# Patient Record
Sex: Male | Born: 1950 | Race: White | Hispanic: No | Marital: Married | Smoking: Never smoker
Health system: Southern US, Community
[De-identification: ages and names within clinical notes are randomized; demographics above are authoritative.]

## PROBLEM LIST (undated history)

## (undated) DIAGNOSIS — E119 Type 2 diabetes mellitus without complications: Secondary | ICD-10-CM

## (undated) DIAGNOSIS — F4310 Post-traumatic stress disorder, unspecified: Secondary | ICD-10-CM

## (undated) DIAGNOSIS — Z9284 Personal history of unintended awareness under general anesthesia: Secondary | ICD-10-CM

## (undated) DIAGNOSIS — J019 Acute sinusitis, unspecified: Secondary | ICD-10-CM

## (undated) DIAGNOSIS — I10 Essential (primary) hypertension: Secondary | ICD-10-CM

## (undated) DIAGNOSIS — E1159 Type 2 diabetes mellitus with other circulatory complications: Secondary | ICD-10-CM

## (undated) DIAGNOSIS — J449 Chronic obstructive pulmonary disease, unspecified: Secondary | ICD-10-CM

## (undated) DIAGNOSIS — B9689 Other specified bacterial agents as the cause of diseases classified elsewhere: Secondary | ICD-10-CM

## (undated) DIAGNOSIS — N400 Enlarged prostate without lower urinary tract symptoms: Secondary | ICD-10-CM

## (undated) DIAGNOSIS — G4730 Sleep apnea, unspecified: Secondary | ICD-10-CM

## (undated) HISTORY — DX: Essential (primary) hypertension: I10

## (undated) HISTORY — DX: Other specified bacterial agents as the cause of diseases classified elsewhere: B96.89

## (undated) HISTORY — PX: CIRCUMCISION: SHX1350

## (undated) HISTORY — PX: COLON SURGERY: SHX602

## (undated) HISTORY — DX: Benign prostatic hyperplasia without lower urinary tract symptoms: N40.0

## (undated) HISTORY — PX: APPENDECTOMY: SHX54

## (undated) HISTORY — PX: NASAL SEPTUM SURGERY: SHX37

## (undated) HISTORY — DX: Sleep apnea, unspecified: G47.30

## (undated) HISTORY — PX: HERNIA REPAIR: SHX51

## (undated) HISTORY — PX: SMALL INTESTINE SURGERY: SHX150

## (undated) HISTORY — DX: Chronic obstructive pulmonary disease, unspecified: J44.9

## (undated) HISTORY — DX: Acute sinusitis, unspecified: J01.90

## (undated) HISTORY — DX: Type 2 diabetes mellitus without complications: E11.9

---

## 2013-03-01 ENCOUNTER — Ambulatory Visit (INDEPENDENT_AMBULATORY_CARE_PROVIDER_SITE_OTHER): Payer: BC Managed Care – PPO | Admitting: Adult Health

## 2013-03-01 ENCOUNTER — Encounter: Payer: Self-pay | Admitting: Adult Health

## 2013-03-01 VITALS — BP 146/82 | HR 87 | Temp 98.0°F | Resp 12 | Ht 68.25 in | Wt 211.5 lb

## 2013-03-01 DIAGNOSIS — E119 Type 2 diabetes mellitus without complications: Secondary | ICD-10-CM

## 2013-03-01 DIAGNOSIS — I10 Essential (primary) hypertension: Secondary | ICD-10-CM

## 2013-03-01 DIAGNOSIS — N529 Male erectile dysfunction, unspecified: Secondary | ICD-10-CM | POA: Insufficient documentation

## 2013-03-01 DIAGNOSIS — E1169 Type 2 diabetes mellitus with other specified complication: Secondary | ICD-10-CM | POA: Insufficient documentation

## 2013-03-01 HISTORY — DX: Essential (primary) hypertension: I10

## 2013-03-01 NOTE — Assessment & Plan Note (Signed)
Patient has recently married approximately 2 months ago. This is his third marriage. There is some anxiety related to this event. Also, patient believes ED occurring because of the blood pressure medication. He has been prescribed atenolol which can cause this side effect. Patient has already stopped taking the atenolol. I have asked him to resume the lisinopril/HCTZ and we will evaluate in 2-3 weeks if symptoms have improved. Patient also has history of enlarged prostate status post procedure in the 1990s; however, patient does not recall what type of procedure. I will try to obtain medical records from previous urologist in [facility] If symptoms persist we will do further workup. Consider referral to urology.

## 2013-03-01 NOTE — Progress Notes (Signed)
Subjective:    Patient ID: Luke Coleman, male    DOB: 1950-12-26, 62 y.o.   MRN: [id number]  HPI  Patient presents to clinic to establish care. He recently moved to the area from [place], [place]. He was commuting to [employer] for several years and decided to move closer to work. He reports having a history of sleep apnea and uses a CPAP nightly. Also has a history of DM and HTN. He was taking lisinopril and atenolol; however, he discontinued both approximately 2 weeks ago d/t having problems with erections. Pt also has a history of enlarged prostate. He had a procedure done in the 1990s but is unable to state what type of procedure. I will obtain medical records from previous providers.   Past Medical History  Diagnosis Date  . Diabetes mellitus without complication   . Hypertension   . COPD (chronic obstructive pulmonary disease)   . Sleep apnea     on CPAP  . Enlarged prostate     Past Surgical History  Procedure Laterality Date  . Appendectomy    . Hernia repair    . Small intestine surgery      Adhesions  . Nasal septum surgery      For deviated septum  . Circumcision  Age 17    Family History  Problem Relation Age of Onset  . Early death Mother     MVA - died age 99  . Alcohol abuse Father   . Hypertension Father   . Cancer Father 56    renal cell carcinoma  . Heart disease Father 48    MI - died  . Diabetes Brother   . Diabetes Maternal Uncle   . Diabetes Paternal Aunt   . Kidney disease Maternal Grandmother     History   Social History  . Marital Status: Married    Spouse Name: Angelica Chessman    Number of Children: 1  . Years of Education: 75   Occupational History  . Public house manager of Continuing Education     [employer]   Social History Main Topics  . Smoking status: Never Smoker   . Smokeless tobacco: Never Used  . Alcohol Use: Yes     Comment: Occasional  . Drug Use: No  . Sexually Active: Yes -- Male partner(s)   Other Topics Concern  .  Not on file   Social History Narrative   [name] grew up in [place], [place], [place]. He currently is living in [place], [place] with his wife of 2 months. He was married previously to his second wife for 29 years. Previous to that, he was married to his first wife for 7 years and has 1 daughter from this marriage (46 y/o). He widowed in 2013 (second wife). 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 He obtained a Scientist, water quality in Programme researcher, broadcasting/film/video, Two Geophysical data processor from [employer] 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 He enjoys baseball. Enjoys the outdoors. He also enjoys watching old movies. He used to be a Psychologist, occupational for a little league team.     Review of Systems  Constitutional: Negative.   HENT: Negative.   Eyes: Negative for photophobia and visual disturbance.       Wears eye glasses. Regular eye exams. Legally blind in the right eye.  Respiratory: Negative.   Cardiovascular: Negative for chest pain, palpitations and leg swelling.  Gastrointestinal: Negative.  Endocrine: Negative.   Genitourinary: Negative for dysuria, urgency, frequency and hematuria.       Weak urine stream. Hx of enlarged prostate.  Musculoskeletal: Negative.   Skin:       Mole on his back which needs to be removed. Refer to derm.  Allergic/Immunologic:       Seasonal allergies  Neurological: Negative.   Hematological: Negative.   Psychiatric/Behavioral: Negative for suicidal ideas, behavioral problems, confusion, sleep disturbance, self-injury and agitation. The patient is nervous/anxious.        Anxiety from recently remarrying     BP 146/82  Pulse 87  Temp(Src) 98 F (36.7 C) (Oral)  Resp 12  Ht 5' 8.25" (1.734 m)  Wt 211 lb 8 oz (95.936 kg)  BMI 31.91 kg/m2  SpO2 97%    Objective:   Physical Exam  Constitutional: He is oriented to person, place, and time.  Overweight,  pleasant male in NAD  HENT:  Head: Normocephalic and atraumatic.  Right Ear: External ear normal.  Left Ear: External ear normal.  Nose: Nose normal.  Mouth/Throat: Oropharynx is clear and moist.  Eyes: Conjunctivae and EOM are normal. Pupils are equal, round, and reactive to light.  Neck: Normal range of motion. Neck supple. No tracheal deviation present.  Cardiovascular: Normal rate, regular rhythm, normal heart sounds and intact distal pulses.  Exam reveals no gallop and no friction rub.   No murmur heard. Pulmonary/Chest: Effort normal and breath sounds normal. No respiratory distress. He has no wheezes. He has no rales.  Abdominal: Soft. Bowel sounds are normal. He exhibits no distension and no mass. There is no tenderness. There is no rebound and no guarding.  Musculoskeletal: Normal range of motion. He exhibits no edema and no tenderness.  Lymphadenopathy:    He has no cervical adenopathy.  Neurological: He is alert and oriented to person, place, and time. He displays normal reflexes. No cranial nerve deficit. He exhibits normal muscle tone. Coordination normal.  Skin: Skin is warm and dry.  Psychiatric: He has a normal mood and affect. His behavior is normal. Judgment and thought content normal.       Assessment & Plan:

## 2013-03-01 NOTE — Patient Instructions (Addendum)
    Thank you for choosing [place] [facility] for your health care needs.  I will request your medical records from your previous providers.  Please return for follow up in 2-3 weeks - blood pressure, diabetes

## 2013-03-01 NOTE — Assessment & Plan Note (Signed)
Patient's blood pressure is slightly elevated. He has stopped all of his blood pressure medication secondary to reports of difficulty obtaining erection. He was on atenolol and lisinopril/HCTZ. I have instructed him to resume the lisinopril/HCTZ. RTC in 2-3 weeks to reevaluate blood pressure.

## 2013-03-01 NOTE — Assessment & Plan Note (Addendum)
Patient reports good control of diabetes. He reports losing approximately 40 pounds since his wife died one year ago. His blood sugars in the a.m. are running in the low 100s. He also reports last hemoglobin A1c was below 7. He is on metformin 500 mg twice a day; however, he reports he has not been taking this medication because he was unsure if he needed to continue. Instructed to continue medication at this time. I will obtain the patient's medical records from previous provider. Return to clinic in 2-3 weeks for followup.

## 2013-03-16 ENCOUNTER — Encounter: Payer: Self-pay | Admitting: Adult Health

## 2013-03-20 ENCOUNTER — Encounter: Payer: Self-pay | Admitting: Adult Health

## 2013-03-20 ENCOUNTER — Ambulatory Visit (INDEPENDENT_AMBULATORY_CARE_PROVIDER_SITE_OTHER): Payer: BC Managed Care – PPO | Admitting: Adult Health

## 2013-03-20 VITALS — BP 124/70 | HR 65 | Resp 12 | Wt 211.0 lb

## 2013-03-20 DIAGNOSIS — N529 Male erectile dysfunction, unspecified: Secondary | ICD-10-CM

## 2013-03-20 DIAGNOSIS — E119 Type 2 diabetes mellitus without complications: Secondary | ICD-10-CM

## 2013-03-20 DIAGNOSIS — I10 Essential (primary) hypertension: Secondary | ICD-10-CM

## 2013-03-20 NOTE — Assessment & Plan Note (Signed)
He reports blood sugars are running between 110 and 115. Continues to take metformin daily. Check hemoglobin A1c.

## 2013-03-20 NOTE — Patient Instructions (Addendum)
  Blood pressure is doing fine.  Continue same medication.  Please have your labs drawn today.  We will notify you of results once available.  Please remember to activate your "MyChart" account at your earliest convenience.

## 2013-03-20 NOTE — Assessment & Plan Note (Signed)
Problems improved after stopping beta blocker. Discussed referral to urologist. Patient will think about it.

## 2013-03-20 NOTE — Assessment & Plan Note (Signed)
Well-controlled on lisinopril/HCTZ. Continue to follow

## 2013-03-20 NOTE — Progress Notes (Signed)
  Subjective:    Patient ID: Luke Coleman, male    DOB: 1951/03/16, 62 y.o.   MRN: [id number]  HPI  Patient presents for follow up of hypertension, diabetes and erectile dysfunction.  Pertaining to his hypertension, he is currently on lisinopril-HCTZ 20-12.5 mg daily. He reports that he monitors his b/p at home and it also has been normal. No dizziness, lightheadedness, syncope or near syncope symptoms.  Patient had been taken off of a beta blocker 2/2 problems with erectile dysfunction and difficulty ejaculating. Patient reports that these symptoms have improved since stopping the medication. He is able to obtain and maintain an erection. He also reports less difficulty with ejaculation.  He also reports that his blood glucose has been running, the highest, between 110 and 115. Continues to take metformin as prescribed. He has no other concerns at this time.   Current Outpatient Prescriptions on File Prior to Visit  Medication Sig Dispense Refill  . lisinopril-hydrochlorothiazide (PRINZIDE,ZESTORETIC) 20-12.5 MG per tablet Take 1 tablet by mouth daily.      . metFORMIN (GLUCOPHAGE) 500 MG tablet Take 500 mg by mouth 2 (two) times daily with a meal.      . Multiple Vitamin (MULTIVITAMIN) tablet Take 1 tablet by mouth daily.       No current facility-administered medications on file prior to visit.    Review of Systems  Constitutional: Negative.   HENT: Negative.   Respiratory: Negative.   Cardiovascular: Negative.   Gastrointestinal: Negative.   Genitourinary: Positive for frequency. Negative for dysuria, hematuria and difficulty urinating.  Neurological: Negative.   Psychiatric/Behavioral: Negative.   All other systems reviewed and are negative.   BP 124/70  Pulse 65  Resp 12  Wt 211 lb (95.709 kg)  BMI 31.83 kg/m2  SpO2 97%    Objective:   Physical Exam  Constitutional: He is oriented to person, place, and time.  Overweight, pleasant male in no apparent distress.   HENT:  Head: Normocephalic and atraumatic.  Cardiovascular: Normal rate, regular rhythm, normal heart sounds and intact distal pulses.  Exam reveals no gallop and no friction rub.   No murmur heard. Pulmonary/Chest: Effort normal and breath sounds normal. No respiratory distress. He has no wheezes. He has no rales. He exhibits no tenderness.  Abdominal: Soft. Bowel sounds are normal.  Musculoskeletal: Normal range of motion. He exhibits no edema and no tenderness.  Lymphadenopathy:    He has no cervical adenopathy.  Neurological: He is oriented to person, place, and time. No cranial nerve deficit. Coordination normal.  Skin: Skin is warm and dry.  Psychiatric: He has a normal mood and affect. His behavior is normal. Judgment and thought content normal.      Assessment & Plan:

## 2013-03-22 ENCOUNTER — Encounter: Payer: Self-pay | Admitting: *Deleted

## 2013-03-23 ENCOUNTER — Ambulatory Visit: Payer: BC Managed Care – PPO | Admitting: Adult Health

## 2013-03-31 NOTE — Telephone Encounter (Signed)
Mailed unread message to pt  

## 2013-04-04 ENCOUNTER — Encounter: Payer: Self-pay | Admitting: *Deleted

## 2013-04-04 ENCOUNTER — Telehealth: Payer: Self-pay | Admitting: *Deleted

## 2013-04-04 NOTE — Telephone Encounter (Signed)
Pt left message regarding most recent eye exam and immunizations, chart updated.

## 2013-05-23 ENCOUNTER — Ambulatory Visit (INDEPENDENT_AMBULATORY_CARE_PROVIDER_SITE_OTHER): Payer: BC Managed Care – PPO | Admitting: Adult Health

## 2013-05-23 ENCOUNTER — Encounter: Payer: Self-pay | Admitting: Adult Health

## 2013-05-23 VITALS — BP 126/68 | HR 89 | Temp 98.5°F | Resp 12 | Wt 214.0 lb

## 2013-05-23 DIAGNOSIS — E66811 Obesity, class 1: Secondary | ICD-10-CM | POA: Insufficient documentation

## 2013-05-23 DIAGNOSIS — E663 Overweight: Secondary | ICD-10-CM | POA: Insufficient documentation

## 2013-05-23 DIAGNOSIS — J329 Chronic sinusitis, unspecified: Secondary | ICD-10-CM | POA: Insufficient documentation

## 2013-05-23 DIAGNOSIS — E6609 Other obesity due to excess calories: Secondary | ICD-10-CM | POA: Insufficient documentation

## 2013-05-23 MED ORDER — PHENTERMINE HCL 37.5 MG PO CAPS: 37.5000 mg | ORAL_CAPSULE | ORAL | Status: DC

## 2013-05-23 MED ORDER — AMOXICILLIN-POT CLAVULANATE 875-125 MG PO TABS: 1.0000 | ORAL_TABLET | Freq: Two times a day (BID) | ORAL | Status: DC

## 2013-05-23 NOTE — Assessment & Plan Note (Signed)
Start Augmentin twice a day x10 days. Over-the-counter cough medication such as Robitussin or Delsym. May try Coricidin products for symptom management. Afrin nasal spray twice a day for 3 days for severe congestion. RTC if symptoms are not improved within 4-5 days.

## 2013-05-23 NOTE — Assessment & Plan Note (Signed)
Patient has taken phentermine in the past. He would like to try this again to help with weight loss. Prescription given to patient. RTC in 1 month for f/u.

## 2013-05-23 NOTE — Progress Notes (Signed)
  Subjective:    Patient ID: Luke Coleman, male    DOB: 03-10-1951, 62 y.o.   MRN: [id number]  HPI  Pt reports sinus congestion since Sunday.  Pt reports greenish drainage from nose, ear pressure and tinnitus. Pt reports diarrhea Monday but that has resolved.  Pt states he uses a CPAP at night and has been unable to use it d/t nasal congestion.  Pt has been taking claritin without relief.    Current Outpatient Prescriptions on File Prior to Visit  Medication Sig Dispense Refill  . lisinopril-hydrochlorothiazide (PRINZIDE,ZESTORETIC) 20-12.5 MG per tablet Take 1 tablet by mouth daily.      . metFORMIN (GLUCOPHAGE) 500 MG tablet Take 500 mg by mouth 2 (two) times daily with a meal.      . Multiple Vitamin (MULTIVITAMIN) tablet Take 1 tablet by mouth daily.       No current facility-administered medications on file prior to visit.     Review of Systems  Constitutional: Negative.  Negative for fever and chills.  HENT: Positive for ear pain, congestion, postnasal drip, sinus pressure and tinnitus.   Eyes: Negative.   Respiratory: Positive for cough.        Cough today from postnasal drip  Musculoskeletal: Negative.   Skin: Negative.   Neurological: Negative.  Negative for dizziness, light-headedness and headaches.  Psychiatric/Behavioral: Negative.        Objective:   Physical Exam  Constitutional: He appears well-developed and well-nourished.  HENT:  Head: Normocephalic.  Right Ear: Tympanic membrane and ear canal normal.  Left Ear: Tympanic membrane and ear canal normal.  Nose: Nose normal.  Mouth/Throat: Oropharynx is clear and moist and mucous membranes are normal.  Pulmonary/Chest: Effort normal and breath sounds normal.  Psychiatric: He has a normal mood and affect. His speech is normal and behavior is normal. Judgment and thought content normal. Cognition and memory are normal.    BP 126/68  Pulse 89  Temp(Src) 98.5 F (36.9 C) (Oral)  Resp 12  Wt 214 lb (97.07 kg)   BMI 32.28 kg/m2  SpO2 97%       Assessment & Plan:

## 2013-05-23 NOTE — Patient Instructions (Addendum)
  Start Augmentin twice a day for 10 days.  You can use afrin nasal spray for severe congestion.  Breathe Right nasal strips are helpful in keeping your nostrils open and help you breathe better.  For cough use robitussin or delsym for cough.  You can try Coricidin products that are intended for those who have high blood pressure.

## 2013-06-02 ENCOUNTER — Other Ambulatory Visit: Payer: Self-pay | Admitting: *Deleted

## 2013-06-29 ENCOUNTER — Other Ambulatory Visit: Payer: Self-pay

## 2013-08-16 ENCOUNTER — Ambulatory Visit (INDEPENDENT_AMBULATORY_CARE_PROVIDER_SITE_OTHER): Payer: BC Managed Care – PPO | Admitting: Internal Medicine

## 2013-08-16 ENCOUNTER — Encounter: Payer: Self-pay | Admitting: Internal Medicine

## 2013-08-16 VITALS — BP 140/92 | HR 111 | Temp 98.2°F | Wt 217.0 lb

## 2013-08-16 DIAGNOSIS — H6690 Otitis media, unspecified, unspecified ear: Secondary | ICD-10-CM

## 2013-08-16 DIAGNOSIS — H6693 Otitis media, unspecified, bilateral: Secondary | ICD-10-CM

## 2013-08-16 MED ORDER — ANTIPYRINE-BENZOCAINE 5.4-1.4 % OT SOLN: 3.0000 [drp] | OTIC | Status: DC | PRN

## 2013-08-16 MED ORDER — AMOXICILLIN-POT CLAVULANATE 875-125 MG PO TABS: 1.0000 | ORAL_TABLET | Freq: Two times a day (BID) | ORAL | Status: DC

## 2013-08-16 NOTE — Assessment & Plan Note (Signed)
Symptoms and exam are most consistent with bilateral otitis media. Will treat with Augmentin and start topical Auralgan as needed for pain. He may use Tylenol or ibuprofen as needed for pain. If no improvement over the next 48-72 hours, patient will call or return to clinic. Followup for recheck of his years in 2 weeks.

## 2013-08-16 NOTE — Progress Notes (Signed)
Pre-visit discussion using our clinic review tool. No additional management support is needed unless otherwise documented below in the visit note.  

## 2013-08-16 NOTE — Patient Instructions (Signed)

## 2013-08-16 NOTE — Progress Notes (Signed)
   Subjective:    Patient ID: Luke Coleman, male    DOB: September 21, 1950, 62 y.o.   MRN: [id number]  HPI  62YO with DM presents for acute visit complains of 2 weeks of nasal congestion, cough with yellow sputum. No shortness of breath. No chest pain. Now also having pressure and ringing in both ears. No flu shot. No fever, chills at home. Taking Cold-Ez and OTC meds with no improvement.  Outpatient Encounter Prescriptions as of 08/16/2013  Medication Sig  . lisinopril-hydrochlorothiazide (PRINZIDE,ZESTORETIC) 20-12.5 MG per tablet Take 1 tablet by mouth daily.  . metFORMIN (GLUCOPHAGE) 500 MG tablet Take 500 mg by mouth 2 (two) times daily with a meal.  . Multiple Vitamin (MULTIVITAMIN) tablet Take 1 tablet by mouth daily.  . phentermine 37.5 MG capsule Take 1 capsule (37.5 mg total) by mouth every morning.  . [DISCONTINUED] amoxicillin-clavulanate (AUGMENTIN) 875-125 MG per tablet Take 1 tablet by mouth 2 (two) times daily.     Review of Systems  Constitutional: Positive for fatigue. Negative for fever, chills and activity change.  HENT: Positive for congestion, ear pain, postnasal drip, rhinorrhea and tinnitus. Negative for ear discharge, hearing loss, nosebleeds, sinus pressure, sneezing, sore throat, trouble swallowing and voice change.   Eyes: Negative for discharge, redness, itching and visual disturbance.  Respiratory: Positive for cough. Negative for chest tightness, shortness of breath, wheezing and stridor.   Cardiovascular: Negative for chest pain and leg swelling.  Musculoskeletal: Negative for arthralgias, myalgias, neck pain and neck stiffness.  Skin: Negative for color change and rash.  Neurological: Negative for dizziness, facial asymmetry and headaches.  Psychiatric/Behavioral: Negative for sleep disturbance.       Objective:   Physical Exam  Constitutional: He is oriented to person, place, and time. He appears well-developed and well-nourished. No distress.  HENT:    Head: Normocephalic and atraumatic.  Right Ear: External ear normal. Tympanic membrane is erythematous and bulging. A middle ear effusion is present.  Left Ear: External ear normal. Tympanic membrane is erythematous and bulging. A middle ear effusion is present.  Nose: Nose normal.  Mouth/Throat: Oropharynx is clear and moist. No oropharyngeal exudate.  Eyes: Conjunctivae and EOM are normal. Pupils are equal, round, and reactive to light. Right eye exhibits no discharge. Left eye exhibits no discharge. No scleral icterus.  Neck: Normal range of motion. Neck supple. No tracheal deviation present. No thyromegaly present.  Cardiovascular: Normal rate, regular rhythm and normal heart sounds.  Exam reveals no gallop and no friction rub.   No murmur heard. Pulmonary/Chest: Effort normal. No accessory muscle usage. Not tachypneic. No respiratory distress. He has decreased breath sounds. He has no wheezes. He has no rhonchi. He has no rales. He exhibits no tenderness.  Musculoskeletal: Normal range of motion. He exhibits no edema.  Lymphadenopathy:    He has no cervical adenopathy.  Neurological: He is alert and oriented to person, place, and time. No cranial nerve deficit. Coordination normal.  Skin: Skin is warm and dry. No rash noted. He is not diaphoretic. No erythema. No pallor.  Psychiatric: He has a normal mood and affect. His behavior is normal. Judgment and thought content normal.          Assessment & Plan:

## 2013-09-11 ENCOUNTER — Telehealth: Payer: Self-pay | Admitting: Adult Health

## 2013-09-11 MED ORDER — METFORMIN HCL 500 MG PO TABS: 500.0000 mg | ORAL_TABLET | Freq: Two times a day (BID) | ORAL | Status: DC

## 2013-09-11 MED ORDER — LISINOPRIL-HYDROCHLOROTHIAZIDE 20-12.5 MG PO TABS: 1.0000 | ORAL_TABLET | Freq: Every day | ORAL | Status: DC

## 2013-09-11 NOTE — Telephone Encounter (Signed)
Rx sent to pharmacy by escript  

## 2013-09-11 NOTE — Telephone Encounter (Signed)
Pt wants to changes his pharmacy to [address] Please send 90 day supply of his medform 500mg  2xdaily lisinpril hctz 20-12.5  1 daily  Please advise when this is done Pt will only have enough meds for today and tomorrow

## 2013-09-14 ENCOUNTER — Ambulatory Visit (INDEPENDENT_AMBULATORY_CARE_PROVIDER_SITE_OTHER): Payer: BC Managed Care – PPO | Admitting: Adult Health

## 2013-09-14 ENCOUNTER — Encounter: Payer: Self-pay | Admitting: Adult Health

## 2013-09-14 VITALS — BP 130/68 | HR 102 | Temp 98.3°F | Resp 12 | Wt 217.0 lb

## 2013-09-14 DIAGNOSIS — I10 Essential (primary) hypertension: Secondary | ICD-10-CM

## 2013-09-14 DIAGNOSIS — Z79899 Other long term (current) drug therapy: Secondary | ICD-10-CM

## 2013-09-14 DIAGNOSIS — R0981 Nasal congestion: Secondary | ICD-10-CM

## 2013-09-14 DIAGNOSIS — E663 Overweight: Secondary | ICD-10-CM

## 2013-09-14 DIAGNOSIS — M79609 Pain in unspecified limb: Secondary | ICD-10-CM

## 2013-09-14 DIAGNOSIS — J3489 Other specified disorders of nose and nasal sinuses: Secondary | ICD-10-CM

## 2013-09-14 DIAGNOSIS — E119 Type 2 diabetes mellitus without complications: Secondary | ICD-10-CM

## 2013-09-14 DIAGNOSIS — M79674 Pain in right toe(s): Secondary | ICD-10-CM

## 2013-09-14 MED ORDER — FLUTICASONE PROPIONATE 50 MCG/ACT NA SUSP: 2.0000 | Freq: Every day | NASAL | Status: DC

## 2013-09-14 MED ORDER — METFORMIN HCL 500 MG PO TABS: 500.0000 mg | ORAL_TABLET | Freq: Two times a day (BID) | ORAL | Status: DC

## 2013-09-14 MED ORDER — PHENTERMINE HCL 37.5 MG PO TABS: 37.5000 mg | ORAL_TABLET | Freq: Every day | ORAL | Status: DC

## 2013-09-14 MED ORDER — CEPHALEXIN 500 MG PO CAPS: 500.0000 mg | ORAL_CAPSULE | Freq: Four times a day (QID) | ORAL | Status: DC

## 2013-09-14 MED ORDER — LISINOPRIL-HYDROCHLOROTHIAZIDE 20-12.5 MG PO TABS: 1.0000 | ORAL_TABLET | Freq: Every day | ORAL | Status: DC

## 2013-09-14 NOTE — Progress Notes (Signed)
   Subjective:    Patient ID: Luke Coleman, male    DOB: 10-25-1950, 63 y.o.   MRN: [id number]  HPI  Patient is a pleasant 63 year old male who presents to clinic with some sinus congestion that has been ongoing for a couple of days. He is concerned because he was in his office with someone who was recently been diagnosed with the flu. He does not have a sore throat, denies fever, no body aches. He also has a small area on his right great toe that is red and irritated. He reports the area is improved. It is less swollen and painful.  Pt reports that the dosage of the phentermine is interfering with sleep. It is a capsule and cannot break in half.   Pt is also here for follow up HTN and DM. He is taking his medications as prescribed. Has been trying to eat healthier. No exercising at present.   Current Outpatient Prescriptions on File Prior to Visit  Medication Sig Dispense Refill  . Multiple Vitamin (MULTIVITAMIN) tablet Take 1 tablet by mouth daily.       No current facility-administered medications on file prior to visit.    Review of Systems  Constitutional: Negative for fever and chills.  HENT: Positive for congestion. Negative for ear pain, postnasal drip, rhinorrhea, sinus pressure and sore throat.   Respiratory: Negative.  Negative for cough and wheezing.   Cardiovascular: Negative.   Skin:       Redness on the top of his right toe  Neurological: Negative.   All other systems reviewed and are negative.       Objective:   Physical Exam  Constitutional: He is oriented to person, place, and time. He appears well-developed and well-nourished. No distress.  HENT:  Head: Normocephalic and atraumatic.  Right Ear: External ear normal.  Left Ear: External ear normal.  Mouth/Throat: Oropharynx is clear and moist. No oropharyngeal exudate.  Cardiovascular: Normal rate and regular rhythm.   Pulmonary/Chest: Effort normal. No respiratory distress.  Musculoskeletal: Normal range  of motion.  No pain with palpation of his joints on his right foot.  Lymphadenopathy:    He has no cervical adenopathy.  Neurological: He is alert and oriented to person, place, and time.  Skin: There is erythema.  Small area on the top of his right great toe that is erythematous and painful  Psychiatric: He has a normal mood and affect. His behavior is normal. Judgment and thought content normal.    BP 130/68  Pulse 102  Temp(Src) 98.3 F (36.8 C) (Oral)  Resp 12  Wt 217 lb (98.431 kg)  SpO2 96%       Assessment & Plan:

## 2013-09-14 NOTE — Progress Notes (Signed)
Pre visit review using our clinic review tool, if applicable. No additional management support is needed unless otherwise documented below in the visit note. 

## 2013-09-14 NOTE — Patient Instructions (Signed)
  Please start Keflex 500 mg twice a day for 7 days.  Please have your labs done prior to leaving the office.  Flonase 2 sprays into each nostril daily  I sent in refills for your medications to [facility]

## 2013-09-15 ENCOUNTER — Telehealth: Payer: Self-pay

## 2013-09-15 LAB — BASIC METABOLIC PANEL
BUN: 19 mg/dL (ref 6–23)
CHLORIDE: 103 meq/L (ref 96–112)
CO2: 29 mEq/L (ref 19–32)
Calcium: 9.4 mg/dL (ref 8.4–10.5)
Creatinine, Ser: 1.3 mg/dL (ref 0.4–1.5)
GFR: 58.93 mL/min — AB (ref >60.00)
Glucose, Bld: 206 mg/dL — ABNORMAL HIGH (ref 70–99)
Potassium: 4.8 mEq/L (ref 3.5–5.1)
SODIUM: 140 meq/L (ref 135–145)

## 2013-09-15 LAB — HEMOGLOBIN A1C: Hgb A1c MFr Bld: 5.9 % (ref 4.6–6.5)

## 2013-09-15 NOTE — Telephone Encounter (Signed)
Relevant patient education assigned to patient using Emmi. ° °

## 2013-09-17 ENCOUNTER — Encounter: Payer: Self-pay | Admitting: Adult Health

## 2013-09-17 DIAGNOSIS — R0981 Nasal congestion: Secondary | ICD-10-CM | POA: Insufficient documentation

## 2013-09-17 DIAGNOSIS — M79674 Pain in right toe(s): Secondary | ICD-10-CM | POA: Insufficient documentation

## 2013-09-17 NOTE — Assessment & Plan Note (Signed)
Reports of nasal congestion. None at present. No other respiratory symptoms. Recommend irrigation of sinuses with saline. Call if he develops fever or other symptoms.

## 2013-09-17 NOTE — Assessment & Plan Note (Signed)
Change from phentermine capsule to tablets so that he could cut in half.

## 2013-09-17 NOTE — Assessment & Plan Note (Signed)
Check HgbA1c. Continue to follow

## 2013-09-17 NOTE — Assessment & Plan Note (Signed)
Taking meds as prescribed. Check bmet. Continue to follow.

## 2013-09-17 NOTE — Assessment & Plan Note (Signed)
Does not appear to be gout. No painful joints in area. The area appears to be small abscess. Start Keflex. Call if no improvement.

## 2013-09-19 NOTE — Telephone Encounter (Signed)
Mailed unread message to pt  

## 2013-10-02 ENCOUNTER — Telehealth: Payer: Self-pay | Admitting: Adult Health
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
## 2013-12-21 ENCOUNTER — Other Ambulatory Visit: Payer: Self-pay | Admitting: Adult Health

## 2014-03-22 ENCOUNTER — Encounter: Payer: Self-pay | Admitting: *Deleted

## 2014-03-26 NOTE — Telephone Encounter (Signed)
Mailed unread message to pt  

## 2014-04-19 ENCOUNTER — Ambulatory Visit: Payer: BC Managed Care – PPO | Admitting: Adult Health

## 2014-04-23 ENCOUNTER — Encounter: Payer: Self-pay | Admitting: Adult Health

## 2014-04-23 ENCOUNTER — Ambulatory Visit (INDEPENDENT_AMBULATORY_CARE_PROVIDER_SITE_OTHER): Payer: BC Managed Care – PPO | Admitting: Adult Health

## 2014-04-23 ENCOUNTER — Telehealth: Payer: Self-pay | Admitting: *Deleted

## 2014-04-23 VITALS — BP 130/70 | HR 81 | Resp 14 | Ht 68.25 in | Wt 216.2 lb

## 2014-04-23 DIAGNOSIS — Z23 Encounter for immunization: Secondary | ICD-10-CM

## 2014-04-23 DIAGNOSIS — E119 Type 2 diabetes mellitus without complications: Secondary | ICD-10-CM

## 2014-04-23 DIAGNOSIS — I10 Essential (primary) hypertension: Secondary | ICD-10-CM

## 2014-04-23 DIAGNOSIS — E663 Overweight: Secondary | ICD-10-CM

## 2014-04-23 LAB — BASIC METABOLIC PANEL
BUN: 17 mg/dL (ref 6–23)
CO2: 27 meq/L (ref 19–32)
CREATININE: 1.3 mg/dL (ref 0.4–1.5)
Calcium: 9.2 mg/dL (ref 8.4–10.5)
Chloride: 104 mEq/L (ref 96–112)
GFR: 59.87 mL/min — ABNORMAL LOW (ref >60.00)
Glucose, Bld: 120 mg/dL — ABNORMAL HIGH (ref 70–99)
Potassium: 4.8 mEq/L (ref 3.5–5.1)
Sodium: 141 mEq/L (ref 135–145)

## 2014-04-23 LAB — HEMOGLOBIN A1C: Hgb A1c MFr Bld: 6 % (ref 4.6–6.5)

## 2014-04-23 MED ORDER — PHENTERMINE HCL 37.5 MG PO TABS: 37.5000 mg | ORAL_TABLET | Freq: Every day | ORAL | Status: DC

## 2014-04-23 NOTE — Progress Notes (Signed)
Patient ID: Luke Coleman, male   DOB: [birth year], 63 y.o.   MRN: [id number]   Subjective:    Patient ID: Luke Coleman, male    DOB: 09/01/1950, 63 y.o.   MRN: [id number]  HPI  Luke Coleman is a pleasant 63 y/o male who presents to clinic for HTN and diabetes follow up. He is also still interested in trying the phentermine. Reports that he went to pick up the prescription at the pharmacy and they did not have them at the time. He never went back to get the medication. He has not gained any weight but he has not really lost any either. Pertaining to his hypertension, he is taking his medications as prescribed. No side effects from medication. Pertaining to his diabetes, he reports that his fasting blood sugars are in the low 100s. No hypoglycemic events. Compliance with medication.  Past Medical History  Diagnosis Date  . Diabetes mellitus without complication   . Hypertension   . COPD (chronic obstructive pulmonary disease)   . Sleep apnea     on CPAP  . Enlarged prostate     Current Outpatient Prescriptions on File Prior to Visit  Medication Sig Dispense Refill  . cephALEXin (KEFLEX) 500 MG capsule Take 1 capsule (500 mg total) by mouth 4 (four) times daily.  14 capsule  0  . fluticasone (FLONASE) 50 MCG/ACT nasal spray Place 2 sprays into both nostrils daily.  16 g  6  . lisinopril-hydrochlorothiazide (PRINZIDE,ZESTORETIC) 20-12.5 MG per tablet Take 1 tablet by mouth daily.  90 tablet  3  . metFORMIN (GLUCOPHAGE) 500 MG tablet Take 1 tablet (500 mg total) by mouth 2 (two) times daily with a meal.  180 tablet  3  . metFORMIN (GLUCOPHAGE) 500 MG tablet TAKE ONE TABLET BY MOUTH TWICE DAILY WITH  A  MEAL  180 tablet  1  . Multiple Vitamin (MULTIVITAMIN) tablet Take 1 tablet by mouth daily.      . phentermine (ADIPEX-P) 37.5 MG tablet Take 1 tablet (37.5 mg total) by mouth daily before breakfast.  30 tablet  3   No current facility-administered medications on file prior to visit.      Review of Systems  Constitutional: Negative.   HENT: Negative.   Eyes: Negative.   Respiratory: Negative.   Cardiovascular: Negative.   Gastrointestinal: Negative.   Endocrine: Negative.   Genitourinary: Negative.   Musculoskeletal: Negative.   Skin: Negative.   Allergic/Immunologic: Negative.   Neurological: Negative.   Hematological: Negative.   Psychiatric/Behavioral: Negative.        Objective:  Ht 5' 8.25" (1.734 m)   Physical Exam  Constitutional: He is oriented to person, place, and time. He appears well-developed and well-nourished. No distress.  HENT:  Head: Normocephalic and atraumatic.  Eyes: Conjunctivae and EOM are normal.  Cardiovascular: Normal rate, regular rhythm, normal heart sounds and intact distal pulses.  Exam reveals no gallop and no friction rub.   No murmur heard. Pulmonary/Chest: Effort normal and breath sounds normal. No respiratory distress. He has no wheezes. He has no rales.  Musculoskeletal: Normal range of motion.  Neurological: He is alert and oriented to person, place, and time.  Skin: Skin is warm and dry.  Psychiatric: He has a normal mood and affect. His behavior is normal. Judgment and thought content normal.       Assessment & Plan:   1. Essential hypertension Controlled on medication. Check labs. Follow. - Basic metabolic panel  2. Diabetes mellitus type 2, controlled  Controlled on medication. Check labs. Adjust medications if necessary. Follow - Hemoglobin A1c; Future  3. Overweight Prescription for phentermine given. Discussed side effects. He may start off by taking half a tablet if he feels jittery with medication and progressed to take 1 tablet daily. We also discussed that there are other medications that may also help if he finds that this one does not work for him.  4. Need for prophylactic vaccination and inoculation against influenza Received a flu vaccine in clinic today - Flu Vaccine QUAD 36+ mos PF IM  (Fluarix Quad PF)

## 2014-04-23 NOTE — Telephone Encounter (Signed)
PA approved, pharmacy notified.

## 2014-04-23 NOTE — Progress Notes (Signed)
Pre visit review using our clinic review tool, if applicable. No additional management support is needed unless otherwise documented below in the visit note. 

## 2014-04-23 NOTE — Telephone Encounter (Signed)
Fax from pharmacy, [employer] needs PA, started online

## 2014-04-23 NOTE — Addendum Note (Signed)
Addended by: Karlene Einstein D on: 04/23/2014 08:42 AM   Modules accepted: Orders

## 2014-04-25 NOTE — Telephone Encounter (Signed)
Mailed unread message to pt  

## 2014-08-20 ENCOUNTER — Other Ambulatory Visit: Payer: Self-pay | Admitting: *Deleted

## 2014-08-20 MED ORDER — METFORMIN HCL 500 MG PO TABS: ORAL_TABLET | ORAL | Status: DC

## 2014-08-22 DIAGNOSIS — B9689 Other specified bacterial agents as the cause of diseases classified elsewhere: Secondary | ICD-10-CM

## 2014-08-22 HISTORY — DX: Other specified bacterial agents as the cause of diseases classified elsewhere: B96.89

## 2014-10-22 ENCOUNTER — Other Ambulatory Visit: Payer: Self-pay | Admitting: *Deleted

## 2014-10-22 MED ORDER — LISINOPRIL-HYDROCHLOROTHIAZIDE 20-12.5 MG PO TABS: 1.0000 | ORAL_TABLET | Freq: Every day | ORAL | Status: DC

## 2014-11-14 ENCOUNTER — Encounter: Payer: Self-pay | Admitting: Internal Medicine

## 2014-11-14 ENCOUNTER — Ambulatory Visit (INDEPENDENT_AMBULATORY_CARE_PROVIDER_SITE_OTHER): Payer: BC Managed Care – PPO | Admitting: Internal Medicine

## 2014-11-14 VITALS — BP 134/72 | HR 106 | Temp 99.1°F | Wt 219.0 lb

## 2014-11-14 DIAGNOSIS — J0110 Acute frontal sinusitis, unspecified: Secondary | ICD-10-CM

## 2014-11-14 MED ORDER — METHYLPREDNISOLONE ACETATE 80 MG/ML IJ SUSP
80.0000 mg | Freq: Once | INTRAMUSCULAR | Status: AC
  Administered 2014-11-14: 80 mg via INTRAMUSCULAR

## 2014-11-14 MED ORDER — FLUTICASONE PROPIONATE 50 MCG/ACT NA SUSP: 2.0000 | Freq: Every day | NASAL | Status: DC

## 2014-11-14 MED ORDER — AMOXICILLIN-POT CLAVULANATE 875-125 MG PO TABS: 1.0000 | ORAL_TABLET | Freq: Two times a day (BID) | ORAL | Status: DC

## 2014-11-14 NOTE — Addendum Note (Signed)
Addended by: Lurlean Nanny on: 11/14/2014 04:57 PM   Modules accepted: Orders

## 2014-11-14 NOTE — Progress Notes (Signed)
HPI  Pt presents to the clinic today with c/o headache, fever, facial pressure, nasal congestion and ear pain. He reports this has been going on 2 week ago now. He is blowing green mucous out of his nose. He denies chills or body aches. He has been taking a sinus rinse, Flonase and Sudafed. He will need a refill of his Flonase today. He does have seasonal allergies. He has not had sick contacts. He does not smoke.  Review of Systems    Past Medical History  Diagnosis Date  . Diabetes mellitus without complication   . Hypertension   . COPD (chronic obstructive pulmonary disease)   . Sleep apnea     on CPAP  . Enlarged prostate     Family History  Problem Relation Age of Onset  . Early death Mother     MVA - died age 35  . Alcohol abuse Father   . Hypertension Father   . Cancer Father 69    renal cell carcinoma  . Heart disease Father 95    MI - died  . Diabetes Brother   . Diabetes Maternal Uncle   . Diabetes Paternal Aunt   . Kidney disease Maternal Grandmother     History   Social History  . Marital Status: Married    Spouse Name: Robin Searing  . Number of Children: 1  . Years of Education: 17   Occupational History  . Scientist, physiological of Continuing Education     [employer]   Social History Main Topics  . Smoking status: Never Smoker   . Smokeless tobacco: Never Used  . Alcohol Use: Yes     Comment: Occasional  . Drug Use: No  . Sexual Activity:    Partners: Female   Other Topics Concern  . Not on file   Social History Narrative   Mr. Choyce grew up in [place], [place], [place]. He currently is living in [place], [place] with his wife of 2 months. He was married previously to his second wife for 29 years. Previous to that, he was married to his first wife for 7 years and has 1 daughter from this marriage (54 y/o). He widowed in 2013 (second wife). 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. He obtained a Oceanographer in Scientist, physiological, [place] from [employer]. 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 He enjoys baseball. Enjoys the outdoors. He also enjoys watching old movies. 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    No Known Allergies   Constitutional: Positive headache, fatigue and fever. Denies abrupt weight changes.  HEENT:  Positive eye pain, facial pain, nasal congestion. Denies eye redness, ear pain, ringing in the ears, wax buildup, runny nose or bloody nose. Respiratory: Denies cough, difficulty breathing or shortness of breath.  Cardiovascular: Denies chest pain, chest tightness, palpitations or swelling in the hands or feet.   No other specific complaints in a complete review of systems (except as listed in HPI above).  Objective:  BP 134/72 mmHg  Pulse 106  Temp(Src) 99.1 F (37.3 C) (Oral)  Wt 219 lb (99.338 kg)  SpO2 99%   General: Appears his stated age,ill appearing in NAD. HEENT: Head: normal shape and size, frontal sinus tenderness noted; Eyes: sclera injected, no icterus, conjunctiva pink; Ears: Tm's gray and intact, normal light reflex; Nose: mucosa pink and moist, septum  midline; Throat/Mouth: + PND. Teeth present, mucosa pink and moist, no exudate noted, no lesions or ulcerations noted.  Neck: No adenopathy noted. Cardiovascular: Tachycardic with normal rhythm. S1,S2 noted.  No murmur, rubs or gallops noted.  Pulmonary/Chest: Normal effort and positive vesicular breath sounds. No respiratory distress. No wheezes, rales or ronchi noted.      Assessment & Plan:   Acute frontal sinusitis  Can use a Neti Pot which can be purchased from your local drug store. Flonase 2 sprays each nostril for 3 days and then as needed. Medication refilled today Augmentin BID for 10 days 80 mg Depo IM today Ibuprofen for fever  RTC as needed or if symptoms persist.

## 2014-11-14 NOTE — Patient Instructions (Signed)

## 2014-11-14 NOTE — Progress Notes (Signed)
Pre visit review using our clinic review tool, if applicable. No additional management support is needed unless otherwise documented below in the visit note. 

## 2015-03-03 ENCOUNTER — Other Ambulatory Visit: Payer: Self-pay | Admitting: Nurse Practitioner

## 2015-03-21 ENCOUNTER — Telehealth: Payer: Self-pay | Admitting: Nurse Practitioner

## 2015-03-21 NOTE — Telephone Encounter (Signed)
Left message on VM for pt to return call to schedule appoint.

## 2015-03-21 NOTE — Telephone Encounter (Signed)
Pt request to have A1c recheck and refill on metformin and lisinopril/msn

## 2015-03-29 ENCOUNTER — Ambulatory Visit (INDEPENDENT_AMBULATORY_CARE_PROVIDER_SITE_OTHER): Payer: BC Managed Care – PPO | Admitting: Nurse Practitioner

## 2015-03-29 VITALS — BP 140/82 | HR 69 | Temp 98.2°F | Resp 18 | Ht 68.25 in | Wt 217.2 lb

## 2015-03-29 DIAGNOSIS — E669 Obesity, unspecified: Secondary | ICD-10-CM | POA: Diagnosis not present

## 2015-03-29 DIAGNOSIS — E119 Type 2 diabetes mellitus without complications: Secondary | ICD-10-CM

## 2015-03-29 DIAGNOSIS — I10 Essential (primary) hypertension: Secondary | ICD-10-CM | POA: Diagnosis not present

## 2015-03-29 DIAGNOSIS — J0110 Acute frontal sinusitis, unspecified: Secondary | ICD-10-CM

## 2015-03-29 LAB — BASIC METABOLIC PANEL
BUN: 23 mg/dL (ref 6–23)
CHLORIDE: 100 meq/L (ref 96–112)
CO2: 29 mEq/L (ref 19–32)
Calcium: 9.5 mg/dL (ref 8.4–10.5)
Creatinine, Ser: 1.17 mg/dL (ref 0.40–1.50)
GFR: 66.81 mL/min (ref >60.00)
Glucose, Bld: 132 mg/dL — ABNORMAL HIGH (ref 70–99)
POTASSIUM: 4.9 meq/L (ref 3.5–5.1)
Sodium: 137 mEq/L (ref 135–145)

## 2015-03-29 LAB — MICROALBUMIN / CREATININE URINE RATIO
Creatinine,U: 156.8 mg/dL
MICROALB UR: 2.7 mg/dL — AB (ref 0.0–1.9)
Microalb Creat Ratio: 1.7 mg/g (ref 0.0–30.0)

## 2015-03-29 LAB — HEMOGLOBIN A1C: Hgb A1c MFr Bld: 6.1 % (ref 4.6–6.5)

## 2015-03-29 MED ORDER — LISINOPRIL-HYDROCHLOROTHIAZIDE 20-12.5 MG PO TABS: 1.0000 | ORAL_TABLET | Freq: Every day | ORAL | Status: DC

## 2015-03-29 MED ORDER — FLUTICASONE PROPIONATE 50 MCG/ACT NA SUSP: 2.0000 | Freq: Every day | NASAL | Status: DC

## 2015-03-29 MED ORDER — METFORMIN HCL 500 MG PO TABS: 500.0000 mg | ORAL_TABLET | Freq: Two times a day (BID) | ORAL | Status: DC

## 2015-03-29 NOTE — Progress Notes (Signed)
Pre visit review using our clinic review tool, if applicable. No additional management support is needed unless otherwise documented below in the visit note. 

## 2015-03-29 NOTE — Patient Instructions (Signed)
Please work on diet and exercise with the handout.   You can make a nurse visit and come back for a blood pressure check- if it is below 140/90 we can start Phentermine. (no copay for nurse visit).   Please visit the lab before leaving today.   Follow up in 3 months for weight loss.

## 2015-03-29 NOTE — Progress Notes (Signed)
   Subjective:    Patient ID: Luke Coleman, male    DOB: [birth year], 64 y.o.   MRN: [id number]  HPI   Mr. Adkins is a 64 yo male with a CC of medication refills.   1) Metformin- 1 tablet twice daily with meals, 140 fasting   2) Flonase- Uses as needed   3) Weight loss-  Diet- Feels hungry all of the time, wants to work on portion control, watching breads, potatoes, and fried food.   Exercise- Riding bike 3-4 x a week 45 min, goal to start back Sept. 1 2016   Eye exam- [facility] in 05/2014   Review of Systems  Constitutional: Negative for fever, chills, diaphoresis and fatigue.  Eyes: Negative for visual disturbance.  Respiratory: Negative for chest tightness, shortness of breath and wheezing.   Cardiovascular: Negative for chest pain, palpitations and leg swelling.  Gastrointestinal: Negative for nausea, vomiting and diarrhea.  Endocrine: Negative for polydipsia, polyphagia and polyuria.  Skin: Negative for rash.  Neurological: Negative for dizziness, weakness and numbness.  Psychiatric/Behavioral: The patient is not nervous/anxious.       Objective:   Physical Exam  Constitutional: He is oriented to person, place, and time. He appears well-developed and well-nourished. No distress.  BP 140/82 mmHg  Pulse 69  Temp(Src) 98.2 F (36.8 C)  Resp 18  Ht 5' 8.25" (1.734 m)  Wt 217 lb 3.2 oz (98.521 kg)  BMI 32.77 kg/m2  SpO2 96% Repeat-   HENT:  Head: Normocephalic and atraumatic.  Right Ear: External ear normal.  Left Ear: External ear normal.  Cardiovascular: Normal rate, regular rhythm, normal heart sounds and intact distal pulses.  Exam reveals no gallop and no friction rub.   No murmur heard. Pulmonary/Chest: Effort normal and breath sounds normal. No respiratory distress. He has no wheezes. He has no rales. He exhibits no tenderness.  Neurological: He is alert and oriented to person, place, and time.  Monofilament exam normal bilaterally  Skin: Skin is warm  and dry. No rash noted. He is not diaphoretic.  Psychiatric: He has a normal mood and affect. His behavior is normal. Judgment and thought content normal.        Assessment & Plan:

## 2015-04-04 ENCOUNTER — Encounter: Payer: Self-pay | Admitting: Nurse Practitioner

## 2015-04-04 NOTE — Assessment & Plan Note (Signed)
BMI 32.   Wt Readings from Last 3 Encounters:  03/29/15 217 lb 3.2 oz (98.521 kg)  11/14/14 219 lb (99.338 kg)  04/23/14 216 lb 4 oz (98.09 kg)   Pt has stayed steady over the last year. Pt wanted to try phentermine, but BP did not meet goal. Re-check was worse than original. Asked pt to start with goals on diet and exercise (see HPI) then we can do a re-check of BP (RN visit) and then possibly start the medication for a short period of time. Will follow up.

## 2015-04-04 NOTE — Assessment & Plan Note (Signed)
Stable.  BP Readings from Last 3 Encounters:  03/29/15 140/82  11/14/14 134/72  04/23/14 130/70   Lab Results  Component Value Date   CREATININE 1.17 03/29/2015   Prinzide refilled. Will follow.

## 2015-04-04 NOTE — Assessment & Plan Note (Signed)
Pt stable on Metformin. Refills given to pt. Discussed diet/exercise and weight loss for help with diabetes control. Will obtain A1c, urine microalbumin, and bmet. Monofilament exam normal bilaterally and intact distal pulses. FU in 3 months.

## 2015-04-22 ENCOUNTER — Encounter: Payer: Self-pay | Admitting: Nurse Practitioner

## 2015-04-22 ENCOUNTER — Ambulatory Visit (INDEPENDENT_AMBULATORY_CARE_PROVIDER_SITE_OTHER): Payer: BC Managed Care – PPO | Admitting: Nurse Practitioner

## 2015-04-22 VITALS — BP 132/74 | HR 94 | Temp 98.1°F | Resp 18 | Ht 68.25 in | Wt 220.8 lb

## 2015-04-22 DIAGNOSIS — N410 Acute prostatitis: Secondary | ICD-10-CM

## 2015-04-22 LAB — POCT URINALYSIS DIPSTICK
Bilirubin, UA: NEGATIVE
Blood, UA: NEGATIVE
Glucose, UA: 500
KETONES UA: NEGATIVE
LEUKOCYTES UA: NEGATIVE
Nitrite, UA: NEGATIVE
PH UA: 5.5
Protein, UA: NEGATIVE
SPEC GRAV UA: 1.025
Urobilinogen, UA: 0.2

## 2015-04-22 MED ORDER — SULFAMETHOXAZOLE-TRIMETHOPRIM 800-160 MG PO TABS: 1.0000 | ORAL_TABLET | Freq: Two times a day (BID) | ORAL | Status: DC

## 2015-04-22 NOTE — Progress Notes (Signed)
Pre visit review using our clinic review tool, if applicable. No additional management support is needed unless otherwise documented below in the visit note. 

## 2015-04-22 NOTE — Patient Instructions (Addendum)
Please take twice daily for 4 weeks.  Follow up in 4 weeks  Please take a probiotic ( Align, Floraque or Culturelle) while you are on the antibiotic to prevent a serious antibiotic associated diarrhea  Called clostirudium dificile colitis and a vaginal yeast infection.

## 2015-04-22 NOTE — Assessment & Plan Note (Signed)
Worsening acute prostatitis. Bactrim DS twice a day for 4 weeks. Follow-up in 4 weeks POCT urine shows glucose all other parameters negative. Encouraged probiotics.

## 2015-04-22 NOTE — Progress Notes (Signed)
Patient ID: Luke Coleman, male    DOB: June 06, 1951  Age: 64 y.o. MRN: [id number]  CC: Abdominal Pain   HPI [name] presents for testicular pain 2-3 weeks.  1) Patient reports last visit he had symptoms of testicular pain and thought that the prostatitis had come back again. Patient reports he did not say anything because he was afraid he was going to have to have a rectal exam. Today he reports he feels the same symptoms as usual when he has prostatitis. His last records are from [facility]. He reports he has been on Cipro before and it did not work. Bactrim was helpful last time.  Patient does have history of enlarged prostate. Patient reports sore testicles down to the groin area, pain with walking, denies dysuria.  History Edge has a past medical history of Diabetes mellitus without complication; Hypertension; COPD (chronic obstructive pulmonary disease); Sleep apnea; and Enlarged prostate.   He has past surgical history that includes Appendectomy; Hernia repair; Small intestine surgery; Nasal septum surgery; and Circumcision (Age 44).   His family history includes Alcohol abuse in his father; Cancer (age of onset: 59) in his father; Diabetes in his brother, maternal uncle, and paternal aunt; Early death in his mother; Heart disease (age of onset: 68) in his father; Hypertension in his father; Kidney disease in his maternal grandmother.He reports that he has never smoked. He has never used smokeless tobacco. He reports that he drinks alcohol. He reports that he does not use illicit drugs.  Outpatient Prescriptions Prior to Visit  Medication Sig Dispense Refill  . fluticasone (FLONASE) 50 MCG/ACT nasal spray Place 2 sprays into both nostrils daily. 16 g 6  . lisinopril-hydrochlorothiazide (PRINZIDE,ZESTORETIC) 20-12.5 MG per tablet Take 1 tablet by mouth daily. 90 tablet 3  . metFORMIN (GLUCOPHAGE) 500 MG tablet Take 1 tablet (500 mg total) by mouth 2 (two) times  daily with a meal. 180 tablet 0  . Multiple Vitamin (MULTIVITAMIN) tablet Take 1 tablet by mouth daily.    Marland Kitchen amoxicillin-clavulanate (AUGMENTIN) 875-125 MG per tablet Take 1 tablet by mouth 2 (two) times daily. 20 tablet 0   No facility-administered medications prior to visit.    ROS Review of Systems  Constitutional: Negative for fever, chills, diaphoresis and fatigue.  Gastrointestinal: Negative for nausea, vomiting and diarrhea.  Genitourinary: Positive for decreased urine volume, difficulty urinating and testicular pain. Negative for dysuria, urgency, frequency, hematuria, penile swelling, scrotal swelling and penile pain.  Skin: Negative for rash and wound.    Objective:  BP 132/74 mmHg  Pulse 94  Temp(Src) 98.1 F (36.7 C)  Resp 18  Ht 5' 8.25" (1.734 m)  Wt 220 lb 12.8 oz (100.154 kg)  BMI 33.31 kg/m2  SpO2 97%  Physical Exam  Constitutional: He is oriented to person, place, and time. He appears well-developed and well-nourished. No distress.  HENT:  Head: Normocephalic and atraumatic.  Right Ear: External ear normal.  Left Ear: External ear normal.  Genitourinary: Rectum normal and penis normal. No penile tenderness.  No color change to testicles, exquisite tenderness to rectal area, prostate enlarged  Neurological: He is alert and oriented to person, place, and time.  Skin: Skin is warm and dry. He is not diaphoretic.  Psychiatric: He has a normal mood and affect. His behavior is normal. Judgment and thought content normal.   Assessment & Plan:   Corie was seen today for abdominal pain.  Diagnoses and all orders for this visit:  Acute prostatitis  Dysuria -     POCT Urinalysis Dipstick  Other orders -     sulfamethoxazole-trimethoprim (BACTRIM DS,SEPTRA DS) 800-160 MG per tablet; Take 1 tablet by mouth 2 (two) times daily.   I have discontinued Mr. Hartig's amoxicillin-clavulanate. I am also having him start on sulfamethoxazole-trimethoprim.  Additionally, I am having him maintain his multivitamin, fluticasone, metFORMIN, and lisinopril-hydrochlorothiazide.  Meds ordered this encounter  Medications  . sulfamethoxazole-trimethoprim (BACTRIM DS,SEPTRA DS) 800-160 MG per tablet    Sig: Take 1 tablet by mouth 2 (two) times daily.    Dispense:  56 tablet    Refill:  0    Order Specific Question:  Supervising Provider    Answer:  Crecencio Mc [[id number]]     Follow-up: Return in about 4 weeks (around 05/20/2015).

## 2015-05-06 ENCOUNTER — Other Ambulatory Visit: Payer: Self-pay | Admitting: Nurse Practitioner

## 2015-05-06 ENCOUNTER — Telehealth: Payer: Self-pay | Admitting: Nurse Practitioner

## 2015-05-06 DIAGNOSIS — N410 Acute prostatitis: Secondary | ICD-10-CM

## 2015-05-06 NOTE — Telephone Encounter (Signed)
Urgent referral to urology placed. Thanks!

## 2015-05-06 NOTE — Telephone Encounter (Signed)
The patient is wanting a referral to a specialist for his prostate problems.

## 2015-05-06 NOTE — Telephone Encounter (Signed)
Spoke with pt, he states he is having a lot of pain in his groin and back. He wants to be referred to a Urologist.  Please advise

## 2015-05-08 ENCOUNTER — Ambulatory Visit: Payer: Self-pay | Admitting: Urology

## 2015-05-09 ENCOUNTER — Encounter: Payer: Self-pay | Admitting: Urology

## 2015-05-09 ENCOUNTER — Ambulatory Visit (INDEPENDENT_AMBULATORY_CARE_PROVIDER_SITE_OTHER): Payer: BC Managed Care – PPO | Admitting: Urology

## 2015-05-09 VITALS — BP 146/75 | HR 89 | Ht 67.0 in | Wt 220.3 lb

## 2015-05-09 DIAGNOSIS — N400 Enlarged prostate without lower urinary tract symptoms: Secondary | ICD-10-CM | POA: Diagnosis not present

## 2015-05-09 DIAGNOSIS — N411 Chronic prostatitis: Secondary | ICD-10-CM

## 2015-05-09 LAB — MICROSCOPIC EXAMINATION
BACTERIA UA: NONE SEEN
EPITHELIAL CELLS (NON RENAL): NONE SEEN /HPF (ref 0–10)
RBC, UA: NONE SEEN /hpf (ref 0–?)

## 2015-05-09 LAB — URINALYSIS, COMPLETE
Bilirubin, UA: NEGATIVE
Glucose, UA: NEGATIVE
Ketones, UA: NEGATIVE
LEUKOCYTES UA: NEGATIVE
Nitrite, UA: NEGATIVE
PH UA: 5.5 (ref 5.0–7.5)
Protein, UA: NEGATIVE
RBC, UA: NEGATIVE
SPEC GRAV UA: 1.025 (ref 1.005–1.030)
Urobilinogen, Ur: 0.2 mg/dL (ref 0.2–1.0)

## 2015-05-09 MED ORDER — SULFAMETHOXAZOLE-TRIMETHOPRIM 800-160 MG PO TABS: 1.0000 | ORAL_TABLET | Freq: Once | ORAL | Status: DC

## 2015-05-09 NOTE — Progress Notes (Signed)
05/09/2015 1:58 PM   Luke Coleman 1950/10/15 [id number]  Referring provider: Rubbie Battiest, NP [address] [place], [place] [address]  Chief Complaint  Patient presents with  . Prostatitis    HPI: Patient has a hx of prostate TURP in [place]  He had his prostate scrapped.  Now symptomatic with slow stream and hesitancy.  Has been tx with bactrim for a month with no change in symptoms   PMH: Past Medical History  Diagnosis Date  . Diabetes mellitus without complication   . Hypertension   . COPD (chronic obstructive pulmonary disease)   . Sleep apnea     on CPAP  . Enlarged prostate   . HTN (hypertension) 03/01/2013  . Essential (primary) hypertension 03/01/2013    Overview:  Last Assessment & Plan:  Taking meds as prescribed. Check bmet. Continue to follow.   . Acute bacterial sinusitis 08/22/2014    Surgical History: Past Surgical History  Procedure Laterality Date  . Appendectomy    . Hernia repair    . Small intestine surgery      Adhesions  . Nasal septum surgery      For deviated septum  . Circumcision  Age 64    Home Medications:    Medication List       This list is accurate as of: 05/09/15  1:58 PM.  Always use your most recent med list.               fluticasone 50 MCG/ACT nasal spray  Commonly known as:  FLONASE  Place 2 sprays into both nostrils daily.     lisinopril-hydrochlorothiazide 20-12.5 MG per tablet  Commonly known as:  PRINZIDE,ZESTORETIC  Take 1 tablet by mouth daily.     metFORMIN 500 MG tablet  Commonly known as:  GLUCOPHAGE  Take 1 tablet (500 mg total) by mouth 2 (two) times daily with a meal.     multivitamin tablet  Take 1 tablet by mouth daily.     sulfamethoxazole-trimethoprim 800-160 MG per tablet  Commonly known as:  BACTRIM DS,SEPTRA DS  Take 1 tablet by mouth 2 (two) times daily.     sulfamethoxazole-trimethoprim 800-160 MG per tablet  Commonly known as:  BACTRIM DS,SEPTRA DS  Take 1  tablet by mouth once.        Allergies: No Known Allergies  Family History: Family History  Problem Relation Age of Onset  . Early death Mother     MVA - died age 38  . Alcohol abuse Father   . Hypertension Father   . Cancer Father 40    renal cell carcinoma  . Heart disease Father 54    MI - died  . Diabetes Brother   . Diabetes Maternal Uncle   . Diabetes Paternal Aunt   . Kidney disease Maternal Grandmother     Social History:  reports that he has never smoked. He has never used smokeless tobacco. He reports that he drinks alcohol. He reports that he does not use illicit drugs.  ROS: UROLOGY Frequent Urination?: Yes Hard to postpone urination?: Yes Burning/pain with urination?: Yes Get up at night to urinate?: Yes Leakage of urine?: No Urine stream starts and stops?: Yes Trouble starting stream?: Yes Do you have to strain to urinate?: No Blood in urine?: No Urinary tract infection?: Yes Sexually transmitted disease?: No Injury to kidneys or bladder?: No Painful intercourse?: No Weak stream?: Yes Erection problems?: Yes Penile pain?: No  Gastrointestinal Nausea?: No Vomiting?:  No Indigestion/heartburn?: Yes Diarrhea?: Yes Constipation?: No  Constitutional Fever: No Night sweats?: No Weight loss?: No Fatigue?: Yes  Skin Skin rash/lesions?: No Itching?: No  Eyes Blurred vision?: No Double vision?: No  Ears/Nose/Throat Sore throat?: No Sinus problems?: Yes  Hematologic/Lymphatic Swollen glands?: Yes Easy bruising?: No  Cardiovascular Leg swelling?: No Chest pain?: No  Respiratory Cough?: No Shortness of breath?: No  Endocrine Excessive thirst?: Yes  Musculoskeletal Back pain?: Yes Joint pain?: No  Neurological Headaches?: No Dizziness?: No  Psychologic Depression?: No Anxiety?: No  Physical Exam: BP 146/75 mmHg  Pulse 89  Ht 5\' 7"  (1.702 m)  Wt 220 lb 4.8 oz (99.927 kg)  BMI 34.50 kg/m2  Constitutional:  Alert and  oriented, No acute distress. HEENT: [place] AT, moist mucus membranes.  Trachea midline, no masses. Cardiovascular: No clubbing, cyanosis, or edema. Respiratory: Normal respiratory effort, no increased work of breathing. GI: Abdomen is soft, nontender, nondistended, no abdominal masses GU: No CVA tenderness. Prostate grade 2/4 with no asymmetry and no nodules.   Skin: No rashes, bruises or suspicious lesions. Lymph: No cervical or inguinal adenopathy. Neurologic: Grossly intact, no focal deficits, moving all 4 extremities. Psychiatric: Normal mood and affect.  Laboratory Data: No results found for: WBC, HGB, HCT, MCV, PLT  Lab Results  Component Value Date   CREATININE 1.17 03/29/2015    No results found for: PSA  No results found for: TESTOSTERONE  Lab Results  Component Value Date   HGBA1C 6.1 03/29/2015    Urinalysis    Component Value Date/Time   BILIRUBINUR neg 04/22/2015 1452   PROTEINUR neg 04/22/2015 1452   UROBILINOGEN 0.2 04/22/2015 1452   NITRITE neg 04/22/2015 1452   LEUKOCYTESUR Negative 04/22/2015 1452    Pertinent Imaging: none  Assessment & Plan:  BPH with voiding dysfunction  My have stricture.  Plan cysto in offce and a continued course of bactrim  There are no diagnoses linked to this encounter.  No Follow-up on file.  Collier Flowers, [facility] [address], [place], [place] [address] [phone]

## 2015-05-10 ENCOUNTER — Other Ambulatory Visit: Payer: Self-pay

## 2015-05-10 MED ORDER — SULFAMETHOXAZOLE-TRIMETHOPRIM 800-160 MG PO TABS: 1.0000 | ORAL_TABLET | Freq: Two times a day (BID) | ORAL | Status: DC

## 2015-05-20 ENCOUNTER — Ambulatory Visit: Payer: BC Managed Care – PPO | Admitting: Nurse Practitioner

## 2015-06-11 ENCOUNTER — Ambulatory Visit (INDEPENDENT_AMBULATORY_CARE_PROVIDER_SITE_OTHER): Payer: BC Managed Care – PPO | Admitting: Urology

## 2015-06-11 ENCOUNTER — Encounter: Payer: Self-pay | Admitting: Urology

## 2015-06-11 VITALS — BP 126/86 | HR 109 | Ht 67.0 in | Wt 218.8 lb

## 2015-06-11 DIAGNOSIS — N528 Other male erectile dysfunction: Secondary | ICD-10-CM

## 2015-06-11 DIAGNOSIS — N400 Enlarged prostate without lower urinary tract symptoms: Secondary | ICD-10-CM

## 2015-06-11 DIAGNOSIS — N411 Chronic prostatitis: Secondary | ICD-10-CM

## 2015-06-11 LAB — URINALYSIS, COMPLETE
Bilirubin, UA: NEGATIVE
GLUCOSE, UA: NEGATIVE
KETONES UA: NEGATIVE
Leukocytes, UA: NEGATIVE
NITRITE UA: NEGATIVE
Protein, UA: NEGATIVE
RBC, UA: NEGATIVE
SPEC GRAV UA: 1.02 (ref 1.005–1.030)
UUROB: 0.2 mg/dL (ref 0.2–1.0)
pH, UA: 5.5 (ref 5.0–7.5)

## 2015-06-11 LAB — MICROSCOPIC EXAMINATION
BACTERIA UA: NONE SEEN
Epithelial Cells (non renal): NONE SEEN /hpf (ref 0–10)
RBC, UA: NONE SEEN /hpf (ref 0–?)

## 2015-06-11 MED ORDER — LIDOCAINE HCL 2 % EX GEL
1.0000 "application " | Freq: Once | CUTANEOUS | Status: AC
  Administered 2015-06-11: 1 via URETHRAL

## 2015-06-11 MED ORDER — FINASTERIDE 5 MG PO TABS: 5.0000 mg | ORAL_TABLET | Freq: Every day | ORAL | Status: DC

## 2015-06-11 MED ORDER — CIPROFLOXACIN HCL 500 MG PO TABS
500.0000 mg | ORAL_TABLET | Freq: Once | ORAL | Status: AC
  Administered 2015-06-11: 500 mg via ORAL

## 2015-06-11 MED ORDER — TADALAFIL 5 MG PO TABS: 5.0000 mg | ORAL_TABLET | Freq: Every day | ORAL | Status: DC | PRN

## 2015-06-11 NOTE — Progress Notes (Unsigned)
    Cystoscopy Procedure Note  Patient identification was confirmed, informed consent was obtained, and patient was prepped using Betadine solution.  Lidocaine jelly was administered per urethral meatus.    Preoperative abx where received prior to procedure.     Pre-Procedure: - Inspection reveals a normal caliber ureteral meatus.  Procedure: The flexible cystoscope was introduced without difficulty - No urethral strictures/lesions are present. -  prostate mostly middle lobe hypertrophy of the prostate the high median bar neck tight prostate. Bladder does not appear to be emptying very well. Uroflow and postvoid residuals being obtained results will be seen elsewhere -  bladder neck - Bilateral ureteral orifices identified - Bladder mucosa  reveals no ulcers, tumors, or lesions - No bladder stones - No trabeculation  Retroflexion shows no tumors masses or growths.   Post-Procedure: - Patient tolerated the procedure well

## 2015-06-11 NOTE — Progress Notes (Unsigned)
Bladder Scan Patient  void: 55 ml   Performed By: Larna Daughters Cialis 5 MG -30 tablets Exp. 06/2017, [id number]

## 2015-06-11 NOTE — Progress Notes (Unsigned)
Uroflow  Peak Flow: 5.45ml Average Flow: 4.64ml Voided Volume: 144.0 ml Voiding Time: 43.3 sec Flow Time:33.2sec Time to Peak Flow: 9.7sec  PVR Volume: 144.0 ml

## 2015-06-11 NOTE — Progress Notes (Unsigned)
Patient here follow-up for urgency frequency of urination with BPH. 64 year old. Also has poor sex life. No stricture disease although he had reported some trauma in the past. Cystoscopy today has revealed enlarged prostate middle lobe mostly. I put him on finasteride and tamsulosin but then he because he has erectile dysfunction I decided to use Cialis 5 mg daily with no tamsulosin his uroflow shows a very poor flow in his postvoid residual be obtained now has voided volume was 144/43 seconds and his time to peak flow was 8.7 seconds the pattern isn't obstructive pattern. So we'll maintain him on a regimen of Cialis 5 mg and finasteride recheck him again in 4 months. Postvoid residual was only 51

## 2015-07-03 ENCOUNTER — Other Ambulatory Visit: Payer: Self-pay | Admitting: Nurse Practitioner

## 2015-07-12 ENCOUNTER — Ambulatory Visit (INDEPENDENT_AMBULATORY_CARE_PROVIDER_SITE_OTHER): Payer: BC Managed Care – PPO | Admitting: Urology

## 2015-07-12 ENCOUNTER — Encounter: Payer: Self-pay | Admitting: Urology

## 2015-07-12 VITALS — BP 130/76 | HR 86 | Ht 67.0 in | Wt 225.0 lb

## 2015-07-12 DIAGNOSIS — N400 Enlarged prostate without lower urinary tract symptoms: Secondary | ICD-10-CM

## 2015-07-12 DIAGNOSIS — N529 Male erectile dysfunction, unspecified: Secondary | ICD-10-CM | POA: Diagnosis not present

## 2015-07-12 LAB — URINALYSIS, COMPLETE
Bilirubin, UA: NEGATIVE
Leukocytes, UA: NEGATIVE
NITRITE UA: NEGATIVE
Protein, UA: NEGATIVE
RBC, UA: NEGATIVE
Specific Gravity, UA: 1.025 (ref 1.005–1.030)
Urobilinogen, Ur: 0.2 mg/dL (ref 0.2–1.0)
pH, UA: 5.5 (ref 5.0–7.5)

## 2015-07-12 LAB — MICROSCOPIC EXAMINATION
BACTERIA UA: NONE SEEN
EPITHELIAL CELLS (NON RENAL): NONE SEEN /HPF (ref 0–10)
RBC MICROSCOPIC, UA: NONE SEEN /HPF (ref 0–?)
WBC, UA: NONE SEEN /hpf (ref 0–?)

## 2015-07-12 MED ORDER — TAMSULOSIN HCL 0.4 MG PO CAPS: 0.4000 mg | ORAL_CAPSULE | Freq: Every day | ORAL | Status: DC

## 2015-07-12 NOTE — Progress Notes (Signed)
07/12/2015 5:35 PM   Luke Coleman [birth year] [id number]  Referring provider: Rubbie Battiest, NP [id number] [place], [place] [address]  CC: voiding issues  HPI:  64 yo M with BPH, ED here for follow up.    He underwent cystoscopy 1 month ago which showed enlarged median lobe.  His PVR was 51 cc.  Uroflow with peak flow 5.0 mL/s without obstructed pattern.    He was started on finasteride 1 month ago and Cialis 5 mg daily x 30 (unable to refill per cost).  Cialis also helped with his erections as well.  Overall, he is quite pleased with his voiding smyptoms which is dramatically improved but certainly not completely resolved since starting these medications. Specifically, his nocturia has improved from 3-4 times now down to 1.   He was also given Bactrim for possible prostatitis but he has been using it since his cytoscopy.   No recent PSA.   Rectal exam performed by Dr. Elnoria Howard.  He does have a remote history of TURP 20 year ago in [place].        IPSS      07/12/15 1600       International Prostate Symptom Score   How often have you had the sensation of not emptying your bladder? About half the time     How often have you had to urinate less than every two hours? Less than half the time     How often have you found you stopped and started again several times when you urinated? Less than half the time     How often have you found it difficult to postpone urination? About half the time     How often have you had a weak urinary stream? Less than 1 in 5 times     How often have you had to strain to start urination? Less than half the time     How many times did you typically get up at night to urinate? 1 Time     Total IPSS Score 14     Quality of Life due to urinary symptoms   If you were to spend the rest of your life with your urinary condition just the way it is now how would you feel about that? Mixed          PMH: Past Medical History    Diagnosis Date  . Diabetes mellitus without complication ([place])   . Hypertension   . COPD (chronic obstructive pulmonary disease) ([place])   . Sleep apnea     on CPAP  . Enlarged prostate   . HTN (hypertension) 03/01/2013  . Essential (primary) hypertension 03/01/2013    Overview:  Last Assessment & Plan:  Taking meds as prescribed. Check bmet. Continue to follow.   . Acute bacterial sinusitis 08/22/2014    Surgical History: Past Surgical History  Procedure Laterality Date  . Appendectomy    . Hernia repair    . Small intestine surgery      Adhesions  . Nasal septum surgery      For deviated septum  . Circumcision  Age 8    Home Medications:    Medication List       This list is accurate as of: 07/12/15  5:35 PM.  Always use your most recent med list.               finasteride 5 MG tablet  Commonly known as:  PROSCAR  Take 1  tablet (5 mg total) by mouth daily.     fluticasone 50 MCG/ACT nasal spray  Commonly known as:  FLONASE  Place 2 sprays into both nostrils daily.     lisinopril-hydrochlorothiazide 20-12.5 MG tablet  Commonly known as:  PRINZIDE,ZESTORETIC  Take 1 tablet by mouth daily.     metFORMIN 500 MG tablet  Commonly known as:  GLUCOPHAGE  TAKE ONE TABLET BY MOUTH TWICE DAILY WITH  A  MEAL     multivitamin tablet  Take 1 tablet by mouth daily.     tadalafil 5 MG tablet  Commonly known as:  CIALIS  Take 1 tablet (5 mg total) by mouth daily as needed for erectile dysfunction.     tamsulosin 0.4 MG Caps capsule  Commonly known as:  FLOMAX  Take 1 capsule (0.4 mg total) by mouth daily.        Allergies: No Known Allergies  Family History: Family History  Problem Relation Age of Onset  . Early death Mother     MVA - died age 34  . Alcohol abuse Father   . Hypertension Father   . Cancer Father 22    renal cell carcinoma  . Heart disease Father 27    MI - died  . Diabetes Brother   . Diabetes Maternal Uncle   . Diabetes Paternal Aunt    . Kidney disease Maternal Grandmother     Social History:  reports that he has never smoked. He has never used smokeless tobacco. He reports that he drinks alcohol. He reports that he does not use illicit drugs.  ROS: UROLOGY Frequent Urination?: Yes Hard to postpone urination?: No Burning/pain with urination?: No Get up at night to urinate?: No Leakage of urine?: No Urine stream starts and stops?: No Trouble starting stream?: No Do you have to strain to urinate?: No Blood in urine?: No Urinary tract infection?: No Sexually transmitted disease?: No Injury to kidneys or bladder?: No Painful intercourse?: No Weak stream?: No Erection problems?: Yes Penile pain?: No  Gastrointestinal Nausea?: No Vomiting?: No Indigestion/heartburn?: No Diarrhea?: Yes Constipation?: No  Constitutional Fever: No Night sweats?: No Weight loss?: No Fatigue?: No  Skin Skin rash/lesions?: No Itching?: No  Eyes Blurred vision?: No Double vision?: No  Ears/Nose/Throat Sore throat?: No Sinus problems?: Yes  Hematologic/Lymphatic Swollen glands?: No Easy bruising?: No  Cardiovascular Leg swelling?: No Chest pain?: No  Respiratory Cough?: No Shortness of breath?: Yes  Endocrine Excessive thirst?: Yes  Musculoskeletal Back pain?: No Joint pain?: No  Neurological Headaches?: No Dizziness?: No  Psychologic Depression?: No Anxiety?: No  Physical Exam: BP 130/76 mmHg  Pulse 86  Ht 5\' 7"  (1.702 m)  Wt 225 lb (102.059 kg)  BMI 35.23 kg/m2  Constitutional:  Alert and oriented, No acute distress. HEENT: [place] AT, moist mucus membranes.  Trachea midline, no masses. Cardiovascular: No clubbing, cyanosis, or edema. Respiratory: Normal respiratory effort, no increased work of breathing. Skin: No rashes, bruises or suspicious lesions. Neurologic: Grossly intact, no focal deficits, moving all 4 extremities. Psychiatric: Normal mood and affect.  Laboratory Data:  Lab  Results  Component Value Date   CREATININE 1.17 03/29/2015    No results found for: PSA  No results found for: TESTOSTERONE  Lab Results  Component Value Date   HGBA1C 6.1 03/29/2015    Urinalysis Results for orders placed or performed in visit on 07/12/15  Microscopic Examination  Result Value Ref Range   WBC, UA None seen 0 -  5 /hpf  RBC, UA None seen 0 -  2 /hpf   Epithelial Cells (non renal) None seen 0 - 10 /hpf   Bacteria, UA None seen None seen/Few  Urinalysis, Complete  Result Value Ref Range   Specific Gravity, UA 1.025 1.005 - 1.030   pH, UA 5.5 5.0 - 7.5   Color, UA Yellow Yellow   Appearance Ur Clear Clear   Leukocytes, UA Negative Negative   Protein, UA Negative Negative/Trace   Glucose, UA 1+ (A) Negative   Ketones, UA Trace (A) Negative   RBC, UA Negative Negative   Bilirubin, UA Negative Negative   Urobilinogen, Ur 0.2 0.2 - 1.0 mg/dL   Nitrite, UA Negative Negative   Microscopic Examination See below:     Pertinent Imaging: n/a  Assessment & Plan:   64 year old male with BPH status post remote history of TURP with  primarily obstructive voiding symptoms. Improving on finasteride and Cialis 5 mg daily.  1. BPH (benign prostatic hyperplasia) - Urinalysis, Complete -PSA today (not obtained previously) -Cialis coupon given, if unable to afford switch to flomax  -continue finasteride -discussed may need outlet procedure in the future if medical therapy fails especially given presence of median lobe  2. ED Continue Cialis if possible  Return in about 6 months (around 01/09/2016) for IPSS. or sooner as needed  [name], MD  [facility] [address] [place], [place] [address] [phone]

## 2015-07-13 LAB — PSA: Prostate Specific Ag, Serum: 0.7 ng/mL (ref 0.0–4.0)

## 2015-07-22 ENCOUNTER — Encounter: Payer: Self-pay | Admitting: Family Medicine

## 2015-07-22 ENCOUNTER — Ambulatory Visit (INDEPENDENT_AMBULATORY_CARE_PROVIDER_SITE_OTHER): Payer: BC Managed Care – PPO | Admitting: Family Medicine

## 2015-07-22 VITALS — BP 132/78 | HR 108 | Temp 99.2°F | Ht 67.0 in | Wt 222.5 lb

## 2015-07-22 DIAGNOSIS — J069 Acute upper respiratory infection, unspecified: Secondary | ICD-10-CM | POA: Diagnosis not present

## 2015-07-22 MED ORDER — AMOXICILLIN-POT CLAVULANATE 875-125 MG PO TABS: 1.0000 | ORAL_TABLET | Freq: Two times a day (BID) | ORAL | Status: DC

## 2015-07-22 MED ORDER — FLUTICASONE PROPIONATE 50 MCG/ACT NA SUSP: 2.0000 | Freq: Every day | NASAL | Status: DC

## 2015-07-22 NOTE — Patient Instructions (Addendum)
It was nice to see you today.  This appears to be viral in nature.  Continue your flonase and use tylenol (and/or Ibuprofen; short term) for your discomfort.  Follow up:  Return if symptoms worsen or fail to improve.  Take care  Dr. Lacinda Axon

## 2015-07-22 NOTE — Progress Notes (Signed)
Pre visit review using our clinic review tool, if applicable. No additional management support is needed unless otherwise documented below in the visit note. 

## 2015-07-22 NOTE — Assessment & Plan Note (Signed)
Patient appears to have a viral infection. Exam unremarkable today. Advised supportive care with continued use of Flonase as well as Sudafed.

## 2015-07-22 NOTE — Progress Notes (Signed)
Subjective:  Patient ID: Luke Coleman, male    DOB: 1950/09/26  Age: 64 y.o. MRN: [id number]  CC: Ear pressure, body aches  HPI: 64 year male presents to clinic today with complaints of ear pressure and body aches.  Patient states that he began not feeling well yesterday. He states that he's been feeling pressure in his years as well as diffuse body aches. Reports some discolored mucus with blowing his nose. No associated cough. No known fever although he has had chills. No exacerbating or relieving factors. No interventions tried.  Social Hx   Social History   Social History  . Marital Status: Married    Spouse Name: Robin Searing  . Number of Children: 1  . Years of Education: 1   Occupational History  . Scientist, physiological of Continuing Education     [employer]   Social History Main Topics  . Smoking status: Never Smoker   . Smokeless tobacco: Never Used  . Alcohol Use: Yes     Comment: Occasional  . Drug Use: No  . Sexual Activity:    Partners: Female   Other Topics Concern  . None   Social History Narrative   Mr. Burningham grew up in [place], [place], [place]. He currently is living in [place], [place] with his wife of 2 months. He was married previously to his second wife for 29 years. Previous to that, he was married to his first wife for 7 years and has 1 daughter from this marriage (43 y/o). He widowed in 2013 (second wife). 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. He obtained a Oceanographer in Scientist, physiological, [employer] from [employer]. 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 He enjoys baseball. Enjoys the outdoors. He also enjoys watching old movies. 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   Review of Systems  Constitutional: Positive for chills. Negative for fever.  HENT: Positive for ear pain.   Musculoskeletal: Positive  for myalgias.   Objective:  BP 132/78 mmHg  Pulse 108  Temp(Src) 99.2 F (37.3 C) (Oral)  Ht 5\' 7"  (1.702 m)  Wt 222 lb 8 oz (100.925 kg)  BMI 34.84 kg/m2  SpO2 97%  BP/Weight 07/22/2015 07/12/2015 [id number]  Systolic BP [id number]  Diastolic BP 78 76 86  Wt. (Lbs) 222.5 225 218.8  BMI 34.84 35.23 34.26   Physical Exam  Constitutional: He appears well-developed. No distress.  HENT:  Head: Normocephalic and atraumatic.  Right Ear: External ear normal.  Left Ear: External ear normal.  Mouth/Throat: Oropharynx is clear and moist.  TM's without bulging or erythema.   Eyes: Conjunctivae are normal.  Neck: Neck supple.  Cardiovascular: Regular rhythm.  Tachycardia present.   Pulmonary/Chest: Effort normal. He has no wheezes. He has no rales.  Lymphadenopathy:    He has no cervical adenopathy.  Neurological: He is alert.  Vitals reviewed.  Lab Results  Component Value Date   GLUCOSE 132* 03/29/2015   NA 137 03/29/2015   K 4.9 03/29/2015   CL 100 03/29/2015   CREATININE 1.17 03/29/2015   BUN 23 03/29/2015   CO2 29 03/29/2015   PSA 0.7 07/12/2015   HGBA1C 6.1 03/29/2015   MICROALBUR 2.7* 03/29/2015    Assessment & Plan:   Problem List Items Addressed This Visit    URI (upper respiratory infection) - Primary  Patient appears to have a viral infection. Exam unremarkable today. Advised supportive care with continued use of Flonase as well as Sudafed.      Relevant Medications   fluticasone (FLONASE) 50 MCG/ACT nasal spray      Meds ordered this encounter  Medications  . fluticasone (FLONASE) 50 MCG/ACT nasal spray    Sig: Place 2 sprays into both nostrils daily.    Dispense:  16 g    Refill:  6    Follow-up: Return if symptoms worsen or fail to improve.  [place]

## 2015-08-02 ENCOUNTER — Telehealth: Payer: Self-pay | Admitting: *Deleted

## 2015-08-02 ENCOUNTER — Other Ambulatory Visit: Payer: Self-pay | Admitting: Family Medicine

## 2015-08-02 MED ORDER — BENZONATATE 100 MG PO CAPS: 100.0000 mg | ORAL_CAPSULE | Freq: Three times a day (TID) | ORAL | Status: DC | PRN

## 2015-08-02 NOTE — Telephone Encounter (Signed)
Please advise 

## 2015-08-02 NOTE — Telephone Encounter (Signed)
My note reflects that this is likely viral illness. He should be seen if he feels that he is not improving. Continue supportive care.

## 2015-08-02 NOTE — Telephone Encounter (Signed)
Rx for Tessalon given for cough. He can use OTC Delsym as well.

## 2015-08-02 NOTE — Telephone Encounter (Signed)
Patient was in to see Dr. Lacinda Axon on 07/22/15. Patient stated that he still had a cough and runny nose. He requested a Rx. Please advise

## 2015-08-02 NOTE — Telephone Encounter (Signed)
Patient states that the cough is continuing and would like something to get rid of it.

## 2015-08-28 ENCOUNTER — Ambulatory Visit (INDEPENDENT_AMBULATORY_CARE_PROVIDER_SITE_OTHER): Payer: BC Managed Care – PPO | Admitting: Nurse Practitioner

## 2015-08-28 ENCOUNTER — Encounter: Payer: Self-pay | Admitting: Nurse Practitioner

## 2015-08-28 VITALS — BP 128/76 | HR 108 | Temp 100.5°F | Ht 67.0 in | Wt 215.0 lb

## 2015-08-28 DIAGNOSIS — J0100 Acute maxillary sinusitis, unspecified: Secondary | ICD-10-CM

## 2015-08-28 MED ORDER — GUAIFENESIN-CODEINE 100-10 MG/5ML PO SYRP: 5.0000 mL | ORAL_SOLUTION | Freq: Every day | ORAL | Status: DC

## 2015-08-28 MED ORDER — ONDANSETRON HCL 4 MG PO TABS: 4.0000 mg | ORAL_TABLET | Freq: Three times a day (TID) | ORAL | Status: DC | PRN

## 2015-08-28 MED ORDER — AMOXICILLIN-POT CLAVULANATE 875-125 MG PO TABS: 1.0000 | ORAL_TABLET | Freq: Two times a day (BID) | ORAL | Status: DC

## 2015-08-28 NOTE — Progress Notes (Signed)
Patient ID: Luke Coleman, male    DOB: [birth year]  Age: 65 y.o. MRN: [id number]  CC: Urinary Frequency and Cough   HPI Luke Coleman presents for CC of cough and urinary frequency.   1) Cough- since before christmas- Mucinex DM max not helpful  Fever tmax 100.5  Nausea, diarrhea   Sick contacts- denies  2) Urinary Frequency- urinating every 45 minutes. Drinking lots of water, but has dry mouth Cialis worked, but was too expensive (300 dollars monthly he reports)  History Luke Coleman has a past medical history of Diabetes mellitus without complication ([place]); Hypertension; COPD (chronic obstructive pulmonary disease) ([place]); Sleep apnea; Enlarged prostate; HTN (hypertension) (03/01/2013); Essential (primary) hypertension (03/01/2013); and Acute bacterial sinusitis (08/22/2014).   He has past surgical history that includes Appendectomy; Hernia repair; Small intestine surgery; Nasal septum surgery; and Circumcision (Age 69).   His family history includes Alcohol abuse in his father; Cancer (age of onset: 33) in his father; Diabetes in his brother, maternal uncle, and paternal aunt; Early death in his mother; Heart disease (age of onset: 4) in his father; Hypertension in his father; Kidney disease in his maternal grandmother.He reports that he has never smoked. He has never used smokeless tobacco. He reports that he drinks alcohol. He reports that he does not use illicit drugs.  Outpatient Prescriptions Prior to Visit  Medication Sig Dispense Refill  . fluticasone (FLONASE) 50 MCG/ACT nasal spray Place 2 sprays into both nostrils daily. 16 g 6  . lisinopril-hydrochlorothiazide (PRINZIDE,ZESTORETIC) 20-12.5 MG per tablet Take 1 tablet by mouth daily. 90 tablet 3  . metFORMIN (GLUCOPHAGE) 500 MG tablet TAKE ONE TABLET BY MOUTH TWICE DAILY WITH  A  MEAL 180 tablet 0  . Multiple Vitamin (MULTIVITAMIN) tablet Take 1 tablet by mouth daily.    . tamsulosin (FLOMAX) 0.4 MG CAPS capsule Take 1 capsule (0.4 mg  total) by mouth daily. 90 capsule 4  . benzonatate (TESSALON) 100 MG capsule Take 1 capsule (100 mg total) by mouth 3 (three) times daily as needed. (Patient not taking: Reported on 08/28/2015) 30 capsule 0  . finasteride (PROSCAR) 5 MG tablet Take 1 tablet (5 mg total) by mouth daily. (Patient not taking: Reported on 08/28/2015) 90 tablet 2  . tadalafil (CIALIS) 5 MG tablet Take 1 tablet (5 mg total) by mouth daily as needed for erectile dysfunction. (Patient not taking: Reported on 07/22/2015) 30 tablet 12   No facility-administered medications prior to visit.    ROS Review of Systems  Constitutional: Positive for fatigue. Negative for fever, chills and diaphoresis.  HENT: Positive for congestion, postnasal drip, rhinorrhea and sinus pressure. Negative for ear discharge, ear pain, sneezing and sore throat.   Eyes: Negative for visual disturbance.  Respiratory: Positive for cough. Negative for chest tightness, shortness of breath and wheezing.   Cardiovascular: Negative for chest pain, palpitations and leg swelling.  Gastrointestinal: Positive for nausea. Negative for vomiting and diarrhea.  Genitourinary: Positive for frequency.  Skin: Negative for rash.  Neurological: Negative for dizziness, weakness and numbness.  Psychiatric/Behavioral: The patient is not nervous/anxious.     Objective:  BP 128/76 mmHg  Pulse 108  Temp(Src) 100.5 F (38.1 C)  Ht 5\' 7"  (1.702 m)  Wt 215 lb (97.523 kg)  BMI 33.67 kg/m2  SpO2 95%  Physical Exam  Constitutional: He is oriented to person, place, and time. He appears well-developed and well-nourished. No distress.  HENT:  Head: Normocephalic and atraumatic.  Right Ear: External ear normal.  Left Ear: External  ear normal.  Mouth/Throat: No oropharyngeal exudate.  TM's injected with serous fluid visualized behind bilaterally  Eyes: EOM are normal. Pupils are equal, round, and reactive to light. Right eye exhibits no discharge. Left eye exhibits no  discharge. No scleral icterus.  Neck: Normal range of motion. Neck supple.  Cardiovascular: Normal rate, regular rhythm and normal heart sounds.  Exam reveals no gallop and no friction rub.   No murmur heard. Pulmonary/Chest: Effort normal and breath sounds normal. No respiratory distress. He has no wheezes. He has no rales. He exhibits no tenderness.  Abdominal: Soft. Bowel sounds are normal. He exhibits no distension and no mass. There is no tenderness. There is no rebound and no guarding.  Genitourinary:  Deferred  Lymphadenopathy:    He has no cervical adenopathy.  Neurological: He is alert and oriented to person, place, and time.  Skin: Skin is warm and dry. No rash noted. He is not diaphoretic.  Psychiatric: He has a normal mood and affect. His behavior is normal. Judgment and thought content normal.   Assessment & Plan:   Luke Coleman was seen today for urinary frequency and cough.  Diagnoses and all orders for this visit:  Acute maxillary sinusitis, recurrence not specified  Other orders -     guaiFENesin-codeine (ROBITUSSIN AC) 100-10 MG/5ML syrup; Take 5 mLs by mouth at bedtime. -     Discontinue: amoxicillin-clavulanate (AUGMENTIN) 875-125 MG tablet; Take 1 tablet by mouth 2 (two) times daily. -     Discontinue: ondansetron (ZOFRAN) 4 MG tablet; Take 1 tablet (4 mg total) by mouth every 8 (eight) hours as needed for nausea or vomiting. -     amoxicillin-clavulanate (AUGMENTIN) 875-125 MG tablet; Take 1 tablet by mouth 2 (two) times daily. -     ondansetron (ZOFRAN) 4 MG tablet; Take 1 tablet (4 mg total) by mouth every 8 (eight) hours as needed for nausea or vomiting.  I have discontinued Luke Coleman's finasteride, tadalafil, and benzonatate. I am also having him start on guaiFENesin-codeine. Additionally, I am having him maintain his multivitamin, lisinopril-hydrochlorothiazide, metFORMIN, tamsulosin, fluticasone, amoxicillin-clavulanate, and ondansetron.  Meds ordered this  encounter  Medications  . guaiFENesin-codeine (ROBITUSSIN AC) 100-10 MG/5ML syrup    Sig: Take 5 mLs by mouth at bedtime.    Dispense:  240 mL    Refill:  0    Order Specific Question:  Supervising Provider    Answer:  Deborra Medina L [[id number]]  . DISCONTD: amoxicillin-clavulanate (AUGMENTIN) 875-125 MG tablet    Sig: Take 1 tablet by mouth 2 (two) times daily.    Dispense:  14 tablet    Refill:  0    Order Specific Question:  Supervising Provider    Answer:  Deborra Medina L [[id number]]  . DISCONTD: ondansetron (ZOFRAN) 4 MG tablet    Sig: Take 1 tablet (4 mg total) by mouth every 8 (eight) hours as needed for nausea or vomiting.    Dispense:  20 tablet    Refill:  0    Order Specific Question:  Supervising Provider    Answer:  Deborra Medina L [[id number]]  . amoxicillin-clavulanate (AUGMENTIN) 875-125 MG tablet    Sig: Take 1 tablet by mouth 2 (two) times daily.    Dispense:  14 tablet    Refill:  0    Order Specific Question:  Supervising Provider    Answer:  Deborra Medina L [[id number]]  . ondansetron (ZOFRAN) 4 MG tablet    Sig: Take 1 tablet (4 mg total)  by mouth every 8 (eight) hours as needed for nausea or vomiting.    Dispense:  20 tablet    Refill:  0    Order Specific Question:  Supervising Provider    Answer:  Crecencio Mc [[id number]]     Follow-up: Return if symptoms worsen or fail to improve.

## 2015-08-28 NOTE — Patient Instructions (Addendum)
Stop the Flomax and follow up with your urologist.  Augmentin twice daily x 7 days.   Please take a probiotic ( Align, Floraque or Culturelle) while you are on the antibiotic to prevent a serious antibiotic associated diarrhea  Called clostirudium dificile colitis and a vaginal yeast infection   5 mL (1 teaspoon) of cough syrup. Don't drive or make important decisions while taking this....just sleep and rest.   Zofran for nausea - Bland foods  Tessalon perles during the day

## 2015-08-28 NOTE — Assessment & Plan Note (Signed)
Unresolved concern. Augmentin due to length of symptoms and severity  Continue Tessalon perles  Robitussin AC- script given to take to pharmacy instructions on AVS for caution and given verbally Encouraged probiotics  FU prn worsening/failure to improve.

## 2015-08-29 ENCOUNTER — Telehealth: Payer: Self-pay | Admitting: Urology

## 2015-08-29 NOTE — Telephone Encounter (Signed)
Pt called & LM on VM.  Stated that he has been taking Flomax and has been going to the bathroom every 30-45 minutes even during the night.  He called his primary care MD and she advised him to stop his Flomax and call the urology office.  Please advise him on what to do.  Please call [phone].

## 2015-08-30 ENCOUNTER — Ambulatory Visit (INDEPENDENT_AMBULATORY_CARE_PROVIDER_SITE_OTHER): Payer: BC Managed Care – PPO | Admitting: Urology

## 2015-08-30 DIAGNOSIS — R339 Retention of urine, unspecified: Secondary | ICD-10-CM

## 2015-08-30 DIAGNOSIS — N529 Male erectile dysfunction, unspecified: Secondary | ICD-10-CM

## 2015-08-30 DIAGNOSIS — N400 Enlarged prostate without lower urinary tract symptoms: Secondary | ICD-10-CM

## 2015-08-30 LAB — BLADDER SCAN AMB NON-IMAGING: Scan Result: 213

## 2015-08-30 NOTE — Telephone Encounter (Signed)
I would actually have him come in, check PVR and urine sample.  Voiding that often could be from inadequate emptying or UTI.    [name], MD

## 2015-08-30 NOTE — Telephone Encounter (Signed)
He can try rapaflo 8 mg daily. If this does not work, he will need to be scheduled for an appt since he is still symptomatic despite trying a couple meds.

## 2015-08-30 NOTE — Telephone Encounter (Signed)
Pt has called back saying that on the flomax he was urinating every 32min with a rather good flow but at times he would have to turn around and go right back to the bathroom. Pt also stated that since coming off of the flomax due to his PCP telling him to his symptoms are not any better. Pt was prescribed cialis but not able to afford it. Please advise.

## 2015-08-30 NOTE — Telephone Encounter (Signed)
Pt is on the way for a PVR and ucx.

## 2015-08-31 LAB — URINALYSIS, COMPLETE
BILIRUBIN UA: NEGATIVE
LEUKOCYTES UA: NEGATIVE
NITRITE UA: NEGATIVE
PH UA: 5 (ref 5.0–7.5)
Protein, UA: NEGATIVE
UUROB: 0.2 mg/dL (ref 0.2–1.0)

## 2015-08-31 LAB — MICROSCOPIC EXAMINATION: EPITHELIAL CELLS (NON RENAL): NONE SEEN /HPF (ref 0–10)

## 2015-09-02 ENCOUNTER — Encounter: Payer: Self-pay | Admitting: Nurse Practitioner

## 2015-09-02 LAB — CULTURE, URINE COMPREHENSIVE

## 2015-09-03 ENCOUNTER — Encounter: Payer: Self-pay | Admitting: Urology

## 2015-09-03 ENCOUNTER — Ambulatory Visit (INDEPENDENT_AMBULATORY_CARE_PROVIDER_SITE_OTHER): Payer: BC Managed Care – PPO | Admitting: Urology

## 2015-09-03 VITALS — BP 100/71 | HR 114 | Ht 67.0 in | Wt 203.8 lb

## 2015-09-03 DIAGNOSIS — N400 Enlarged prostate without lower urinary tract symptoms: Secondary | ICD-10-CM | POA: Diagnosis not present

## 2015-09-03 DIAGNOSIS — R339 Retention of urine, unspecified: Secondary | ICD-10-CM | POA: Diagnosis not present

## 2015-09-03 DIAGNOSIS — N529 Male erectile dysfunction, unspecified: Secondary | ICD-10-CM | POA: Diagnosis not present

## 2015-09-03 LAB — BLADDER SCAN AMB NON-IMAGING: SCAN RESULT: 67

## 2015-09-03 NOTE — Progress Notes (Signed)
Bladder Scan Patient} void: 67 ml Performed By: Larna Daughters

## 2015-09-03 NOTE — Progress Notes (Signed)
2:33 PM  09/03/2015   Luke Coleman 01/17/1951 [id number]  Referring provider: Rubbie Battiest, NP [name] [place], [place] [address]  CC: voiding issues  HPI:  65 yo M with BPH, ED  who returns today 4 recheck of a post void residual into reassess urinary symptoms. Last week, he presented to our clinic with acute worsening of his urinary symptoms after taking cold medication.   He has since stopped his Flomax and is restarted his Cialis. He continues to take finasteride.   Urine culture negative last week.   Today, he is doing a little bit better but continues to have urinary frequency and nocturia.   He does feel that he is emptying his bladder little bit better than last week.   He reports that  he has had an extremely dry mouth and has been drinking tones of water over the past few days.  He underwent cystoscopy in 05/2015  which showed enlarged median lobe.  His PVR was previously 51 cc.  Uroflow with peak flow 5.0 mL/s without obstructed pattern.    He was previously started on finasteride in October 2015 as well as Cialis 5 mg daily which worked well ( has issues with cost of medication).  He does have a remote history of TURP 20 year ago in [place].     PMH: Past Medical History  Diagnosis Date  . Diabetes mellitus without complication ([place])   . Hypertension   . COPD (chronic obstructive pulmonary disease) ([place])   . Sleep apnea     on CPAP  . Enlarged prostate   . HTN (hypertension) 03/01/2013  . Essential (primary) hypertension 03/01/2013    Overview:  Last Assessment & Plan:  Taking meds as prescribed. Check bmet. Continue to follow.   . Acute bacterial sinusitis 08/22/2014    Surgical History: Past Surgical History  Procedure Laterality Date  . Appendectomy    . Hernia repair    . Small intestine surgery      Adhesions  . Nasal septum surgery      For deviated septum  . Circumcision  Age 83    Home Medications:    Medication  List       This list is accurate as of: 09/03/15  2:33 PM.  Always use your most recent med list.               amoxicillin-clavulanate 875-125 MG tablet  Commonly known as:  AUGMENTIN  Take 1 tablet by mouth 2 (two) times daily.     benzonatate 100 MG capsule  Commonly known as:  TESSALON  Reported on 09/03/2015     finasteride 5 MG tablet  Commonly known as:  PROSCAR     fluticasone 50 MCG/ACT nasal spray  Commonly known as:  FLONASE  Place 2 sprays into both nostrils daily.     guaiFENesin-codeine 100-10 MG/5ML syrup  Commonly known as:  ROBITUSSIN AC  Take 5 mLs by mouth at bedtime.     lisinopril-hydrochlorothiazide 20-12.5 MG tablet  Commonly known as:  PRINZIDE,ZESTORETIC  Take 1 tablet by mouth daily.     metFORMIN 500 MG tablet  Commonly known as:  GLUCOPHAGE  TAKE ONE TABLET BY MOUTH TWICE DAILY WITH  A  MEAL     multivitamin tablet  Take 1 tablet by mouth daily.     ondansetron 4 MG tablet  Commonly known as:  ZOFRAN  Take 1 tablet (4 mg total) by mouth every 8 (  eight) hours as needed for nausea or vomiting.     phentermine 37.5 MG tablet  Commonly known as:  ADIPEX-P  Take by mouth. Reported on 09/03/2015     tadalafil 5 MG tablet  Commonly known as:  CIALIS  Take 5 mg by mouth daily.        Allergies: No Known Allergies  Family History: Family History  Problem Relation Age of Onset  . Early death Mother     MVA - died age 76  . Alcohol abuse Father   . Hypertension Father   . Cancer Father 12    renal cell carcinoma  . Heart disease Father 41    MI - died  . Diabetes Brother   . Diabetes Maternal Uncle   . Diabetes Paternal Aunt   . Kidney disease Maternal Grandmother     Social History:  reports that he has never smoked. He has never used smokeless tobacco. He reports that he drinks alcohol. He reports that he does not use illicit drugs.  Physical Exam: BP 100/71 mmHg  Pulse 114  Ht 5\' 7"  (1.702 m)  Wt 203 lb 12.8 oz (92.443  kg)  BMI 31.91 kg/m2  Constitutional:  Alert and oriented, No acute distress. HEENT: [place] AT, moist mucus membranes.  Trachea midline, no masses. Cardiovascular: No clubbing, cyanosis, or edema. Respiratory: Normal respiratory effort, no increased work of breathing. Skin: No rashes, bruises or suspicious lesions. Neurologic: Grossly intact, no focal deficits, moving all 4 extremities. Psychiatric: Normal mood and affect.  Laboratory Data:  Lab Results  Component Value Date   CREATININE 1.17 03/29/2015    Lab Results  Component Value Date   PSA 0.7 07/12/2015    Lab Results  Component Value Date   HGBA1C 6.1 03/29/2015    Urinalysis Results for orders placed or performed in visit on 09/03/15  Bladder Scan (Post Void Residual) in office  Result Value Ref Range   Scan Result 67     Pertinent Imaging: Bladder scan as above  Assessment & Plan:   65 year old male with BPH status post remote history of TURP with  primarily obstructive voiding symptoms. Acute worsening of urinary symptoms  Likely associated with cold medications. Postvoid residual today improved without concern for ongoing borderline retention. I advised him to continue to abstain from cold medications /cough suppressants which can suppress his bladder. His urinary frequency is likely related to his increased intake of water due to his dry mouth.  1. BPH (benign prostatic hyperplasia) -continue finasteride  And Cialis 5 mg daily - plan to reassess symptoms in 1 month after cold has resolved -discussed may need outlet procedure in the future if medical therapy fails especially given presence of median lobe  2. ED Continue Cialis if possible  3. Incomplete bladder emptying  As above,  Improved.   [name], MD  [facility] [address] [place],  [address] [phone]

## 2015-09-03 NOTE — Progress Notes (Signed)
2:07 PM  08/29/14   [name] [birth year] [id number]  Referring provider: Rubbie Battiest, NP [id number] [place], [place] [address]  CC: voiding issues  HPI:  65 yo M with BPH, ED who presents to the office today after complaining of worsening urinary symptoms including urgency, frequency every 15 minutes and sensation of incomplete bladder emptying over the past few days. There was some concern for urinary retention and postvoid residual is elevated today to just over 200 cc. He reports that he's had an cold and has been taking cold medication including Mucinex DM.  His voiding symptoms are acute change from last visit.   He denies any dysuria or gross hematuria. No fevers or chills.   He underwent cystoscopy in 05/2015  which showed enlarged median lobe.  His PVR was previously 51 cc.  Uroflow with peak flow 5.0 mL/s without obstructed pattern.    He was previously started on finasteride in October 2015 as well as Cialis 5 mg daily which worked well. Unfortunately due to cost, he is unable to continue Cialis and has switched to Flomax. He reports that since starting Flomax, his symptoms have gotten much worse.  He does have a remote history of TURP 20 year ago in [place].     PMH: Past Medical History  Diagnosis Date  . Diabetes mellitus without complication ([place])   . Hypertension   . COPD (chronic obstructive pulmonary disease) ([place])   . Sleep apnea     on CPAP  . Enlarged prostate   . HTN (hypertension) 03/01/2013  . Essential (primary) hypertension 03/01/2013    Overview:  Last Assessment & Plan:  Taking meds as prescribed. Check bmet. Continue to follow.   . Acute bacterial sinusitis 08/22/2014    Surgical History: Past Surgical History  Procedure Laterality Date  . Appendectomy    . Hernia repair    . Small intestine surgery      Adhesions  . Nasal septum surgery      For deviated septum  . Circumcision  Age 33    Home Medications:      Medication List       This list is accurate as of: 08/30/15 11:59 PM.  Always use your most recent med list.               amoxicillin-clavulanate 875-125 MG tablet  Commonly known as:  AUGMENTIN  Take 1 tablet by mouth 2 (two) times daily.     fluticasone 50 MCG/ACT nasal spray  Commonly known as:  FLONASE  Place 2 sprays into both nostrils daily.     guaiFENesin-codeine 100-10 MG/5ML syrup  Commonly known as:  ROBITUSSIN AC  Take 5 mLs by mouth at bedtime.     lisinopril-hydrochlorothiazide 20-12.5 MG tablet  Commonly known as:  PRINZIDE,ZESTORETIC  Take 1 tablet by mouth daily.     metFORMIN 500 MG tablet  Commonly known as:  GLUCOPHAGE  TAKE ONE TABLET BY MOUTH TWICE DAILY WITH  A  MEAL     multivitamin tablet  Take 1 tablet by mouth daily.     ondansetron 4 MG tablet  Commonly known as:  ZOFRAN  Take 1 tablet (4 mg total) by mouth every 8 (eight) hours as needed for nausea or vomiting.     tamsulosin 0.4 MG Caps capsule  Commonly known as:  FLOMAX  Take 1 capsule (0.4 mg total) by mouth daily.        Allergies: No Known  Allergies  Family History: Family History  Problem Relation Age of Onset  . Early death Mother     MVA - died age 53  . Alcohol abuse Father   . Hypertension Father   . Cancer Father 37    renal cell carcinoma  . Heart disease Father 30    MI - died  . Diabetes Brother   . Diabetes Maternal Uncle   . Diabetes Paternal Aunt   . Kidney disease Maternal Grandmother     Social History:  reports that he has never smoked. He has never used smokeless tobacco. He reports that he drinks alcohol. He reports that he does not use illicit drugs.  Physical Exam: There were no vitals taken for this visit.  Constitutional:  Alert and oriented, No acute distress. HEENT: [place] AT, moist mucus membranes.  Trachea midline, no masses. Cardiovascular: No clubbing, cyanosis, or edema. Respiratory: Normal respiratory effort, no increased work of  breathing. Skin: No rashes, bruises or suspicious lesions. Neurologic: Grossly intact, no focal deficits, moving all 4 extremities. Psychiatric: Normal mood and affect.  Laboratory Data:  Lab Results  Component Value Date   CREATININE 1.17 03/29/2015    Lab Results  Component Value Date   PSA 0.7 07/12/2015    Lab Results  Component Value Date   HGBA1C 6.1 03/29/2015    Urinalysis Results for orders placed or performed in visit on 08/30/15  CULTURE, URINE COMPREHENSIVE  Result Value Ref Range   Urine Culture, Comprehensive Final report    Result 1 Comment   Microscopic Examination  Result Value Ref Range   WBC, UA 0-5 0 -  5 /hpf   RBC, UA 0-2 0 -  2 /hpf   Epithelial Cells (non renal) None seen 0 - 10 /hpf   Mucus, UA Present (A) Not Estab.   Bacteria, UA Few (A) None seen/Few  Urinalysis, Complete  Result Value Ref Range   Specific Gravity, UA <1.005 (L) 1.005 - 1.030   pH, UA 5.0 5.0 - 7.5   Color, UA Yellow Yellow   Appearance Ur Clear Clear   Leukocytes, UA Negative Negative   Protein, UA Negative Negative/Trace   Glucose, UA 3+ (A) Negative   Ketones, UA 1+ (A) Negative   RBC, UA Trace (A) Negative   Bilirubin, UA Negative Negative   Urobilinogen, Ur 0.2 0.2 - 1.0 mg/dL   Nitrite, UA Negative Negative   Microscopic Examination See below:   BLADDER SCAN AMB NON-IMAGING  Result Value Ref Range   Scan Result 213     Pertinent Imaging: Bladder scan as above  Assessment & Plan:   65 year old male with BPH status post remote history of TURP with  primarily obstructive voiding symptoms. Acute worsening of urinary symptoms after change from Cialis to Flomax. I suspect that his underlying symptoms are exacerbated by cold medication and have advised him to stop this today. In the setting of elevated postvoid residual, he was offered to learn how to self catheterize himself but refused.  No evidence of UTI today.  1. BPH (benign prostatic hyperplasia) -  Urinalysis, Complete/ UCx to r/o infection although unlikely -Patient given samples of Cialis 5 mg daily to resume this medication -advised to stop cold medication -continue finasteride -discussed may need outlet procedure in the future if medical therapy fails especially given presence of median lobe -RTC early next week to recheck PVR/ symptoms  2. ED Continue Cialis if possible  3. Incomplete bladder emptying  As above  [name], MD  [facility] [address] [place], [place] [address] [phone]

## 2015-09-04 ENCOUNTER — Other Ambulatory Visit: Payer: Self-pay | Admitting: Nurse Practitioner

## 2015-09-04 ENCOUNTER — Encounter: Payer: Self-pay | Admitting: Nurse Practitioner

## 2015-09-04 ENCOUNTER — Inpatient Hospital Stay
Admission: EM | Admit: 2015-09-04 | Discharge: 2015-09-06 | DRG: 638 | Disposition: A | Discharge status: Home / Self Care | Payer: BC Managed Care – PPO | Attending: Internal Medicine | Admitting: Internal Medicine

## 2015-09-04 ENCOUNTER — Telehealth: Payer: Self-pay

## 2015-09-04 ENCOUNTER — Emergency Department: Payer: BC Managed Care – PPO

## 2015-09-04 ENCOUNTER — Encounter: Payer: Self-pay | Admitting: Emergency Medicine

## 2015-09-04 ENCOUNTER — Ambulatory Visit (INDEPENDENT_AMBULATORY_CARE_PROVIDER_SITE_OTHER): Payer: BC Managed Care – PPO | Admitting: Nurse Practitioner

## 2015-09-04 VITALS — BP 108/74 | HR 109 | Temp 97.7°F | Resp 16 | Ht 67.0 in | Wt 204.2 lb

## 2015-09-04 DIAGNOSIS — E871 Hypo-osmolality and hyponatremia: Secondary | ICD-10-CM | POA: Diagnosis present

## 2015-09-04 DIAGNOSIS — G4733 Obstructive sleep apnea (adult) (pediatric): Secondary | ICD-10-CM | POA: Diagnosis present

## 2015-09-04 DIAGNOSIS — J449 Chronic obstructive pulmonary disease, unspecified: Secondary | ICD-10-CM | POA: Diagnosis present

## 2015-09-04 DIAGNOSIS — E669 Obesity, unspecified: Secondary | ICD-10-CM | POA: Diagnosis present

## 2015-09-04 DIAGNOSIS — Z9111 Patient's noncompliance with dietary regimen: Secondary | ICD-10-CM

## 2015-09-04 DIAGNOSIS — E118 Type 2 diabetes mellitus with unspecified complications: Secondary | ICD-10-CM | POA: Diagnosis not present

## 2015-09-04 DIAGNOSIS — Z79899 Other long term (current) drug therapy: Secondary | ICD-10-CM

## 2015-09-04 DIAGNOSIS — Z7984 Long term (current) use of oral hypoglycemic drugs: Secondary | ICD-10-CM | POA: Diagnosis not present

## 2015-09-04 DIAGNOSIS — N400 Enlarged prostate without lower urinary tract symptoms: Secondary | ICD-10-CM | POA: Diagnosis present

## 2015-09-04 DIAGNOSIS — N179 Acute kidney failure, unspecified: Secondary | ICD-10-CM | POA: Diagnosis present

## 2015-09-04 DIAGNOSIS — J329 Chronic sinusitis, unspecified: Secondary | ICD-10-CM | POA: Diagnosis present

## 2015-09-04 DIAGNOSIS — N529 Male erectile dysfunction, unspecified: Secondary | ICD-10-CM | POA: Diagnosis present

## 2015-09-04 DIAGNOSIS — I10 Essential (primary) hypertension: Secondary | ICD-10-CM | POA: Diagnosis present

## 2015-09-04 DIAGNOSIS — E1110 Type 2 diabetes mellitus with ketoacidosis without coma: Secondary | ICD-10-CM

## 2015-09-04 DIAGNOSIS — Z6830 Body mass index (BMI) 30.0-30.9, adult: Secondary | ICD-10-CM

## 2015-09-04 DIAGNOSIS — E1310 Other specified diabetes mellitus with ketoacidosis without coma: Secondary | ICD-10-CM | POA: Diagnosis present

## 2015-09-04 LAB — BASIC METABOLIC PANEL
ANION GAP: 20 — AB (ref 5–15)
ANION GAP: 19 — AB (ref 5–15)
ANION GAP: 8 (ref 5–15)
BUN: 38 mg/dL — ABNORMAL HIGH (ref 6–20)
BUN: 37 mg/dL — ABNORMAL HIGH (ref 6–20)
BUN: 33 mg/dL — ABNORMAL HIGH (ref 6–20)
CALCIUM: 9.7 mg/dL (ref 8.9–10.3)
CHLORIDE: 95 mmol/L — AB (ref 101–111)
CO2: 20 mmol/L — AB (ref 22–32)
CO2: 21 mmol/L — AB (ref 22–32)
CO2: 24 mmol/L (ref 22–32)
CREATININE: 1.79 mg/dL — AB (ref 0.61–1.24)
Calcium: 9.7 mg/dL (ref 8.9–10.3)
Calcium: 9.1 mg/dL (ref 8.9–10.3)
Chloride: 90 mmol/L — ABNORMAL LOW (ref 101–111)
Chloride: 102 mmol/L (ref 101–111)
Creatinine, Ser: 1.68 mg/dL — ABNORMAL HIGH (ref 0.61–1.24)
Creatinine, Ser: 1.49 mg/dL — ABNORMAL HIGH (ref 0.61–1.24)
GFR calc Af Amer: 48 mL/min — ABNORMAL LOW (ref >60)
GFR calc non Af Amer: 42 mL/min — ABNORMAL LOW (ref >60)
GFR calc non Af Amer: 48 mL/min — ABNORMAL LOW (ref >60)
GFR, EST AFRICAN AMERICAN: 45 mL/min — AB (ref >60)
GFR, EST AFRICAN AMERICAN: 56 mL/min — AB (ref >60)
GFR, EST NON AFRICAN AMERICAN: 39 mL/min — AB (ref >60)
GLUCOSE: 774 mg/dL — AB (ref 65–99)
GLUCOSE: 481 mg/dL — AB (ref 65–99)
GLUCOSE: 427 mg/dL — AB (ref 65–99)
POTASSIUM: 4.1 mmol/L (ref 3.5–5.1)
POTASSIUM: 3.7 mmol/L (ref 3.5–5.1)
Potassium: 5.3 mmol/L — ABNORMAL HIGH (ref 3.5–5.1)
Sodium: 130 mmol/L — ABNORMAL LOW (ref 135–145)
Sodium: 135 mmol/L (ref 135–145)
Sodium: 134 mmol/L — ABNORMAL LOW (ref 135–145)

## 2015-09-04 LAB — GLUCOSE, CAPILLARY
GLUCOSE-CAPILLARY: 452 mg/dL — AB (ref 65–99)
GLUCOSE-CAPILLARY: 559 mg/dL — AB (ref 65–99)
GLUCOSE-CAPILLARY: 184 mg/dL — AB (ref 65–99)
Glucose-Capillary: 598 mg/dL (ref 65–99)
Glucose-Capillary: 491 mg/dL — ABNORMAL HIGH (ref 65–99)
Glucose-Capillary: 296 mg/dL — ABNORMAL HIGH (ref 65–99)
Glucose-Capillary: 166 mg/dL — ABNORMAL HIGH (ref 65–99)
Glucose-Capillary: 171 mg/dL — ABNORMAL HIGH (ref 65–99)

## 2015-09-04 LAB — URINALYSIS COMPLETE WITH MICROSCOPIC (ARMC ONLY)
Bacteria, UA: NONE SEEN
Bilirubin Urine: NEGATIVE
Glucose, UA: >500 mg/dL — AB
Hgb urine dipstick: NEGATIVE
Leukocytes, UA: NEGATIVE
NITRITE: NEGATIVE
PH: 5 (ref 5.0–8.0)
PROTEIN: NEGATIVE mg/dL
RBC / HPF: NONE SEEN RBC/hpf (ref 0–5)
Specific Gravity, Urine: 1.024 (ref 1.005–1.030)
Squamous Epithelial / LPF: NONE SEEN

## 2015-09-04 LAB — TROPONIN I

## 2015-09-04 LAB — CBC
HCT: 51.5 % (ref 40.0–52.0)
Hemoglobin: 17 g/dL (ref 13.0–18.0)
MCH: 31.2 pg (ref 26.0–34.0)
MCHC: 33.1 g/dL (ref 32.0–36.0)
MCV: 94.3 fL (ref 80.0–100.0)
PLATELETS: 234 10*3/uL (ref 150–440)
RBC: 5.46 MIL/uL (ref 4.40–5.90)
RDW: 12.6 % (ref 11.5–14.5)
WBC: 13.7 10*3/uL — ABNORMAL HIGH (ref 3.8–10.6)

## 2015-09-04 LAB — MRSA PCR SCREENING: MRSA BY PCR: NEGATIVE

## 2015-09-04 MED ORDER — FINASTERIDE 5 MG PO TABS
5.0000 mg | ORAL_TABLET | Freq: Every day | ORAL | Status: DC
  Administered 2015-09-05 – 2015-09-06 (×2): 5 mg via ORAL
  Filled 2015-09-04 (×2): qty 1

## 2015-09-04 MED ORDER — SODIUM CHLORIDE 0.9 % IJ SOLN
3.0000 mL | Freq: Two times a day (BID) | INTRAMUSCULAR | Status: DC
  Administered 2015-09-04 – 2015-09-06 (×3): 3 mL via INTRAVENOUS

## 2015-09-04 MED ORDER — METFORMIN HCL 1000 MG PO TABS: 1000.0000 mg | ORAL_TABLET | Freq: Two times a day (BID) | ORAL | Status: DC

## 2015-09-04 MED ORDER — DEXTROSE-NACL 5-0.45 % IV SOLN
INTRAVENOUS | Status: DC
  Administered 2015-09-04 – 2015-09-05 (×4): via INTRAVENOUS

## 2015-09-04 MED ORDER — PNEUMOCOCCAL VAC POLYVALENT 25 MCG/0.5ML IJ INJ
0.5000 mL | INJECTION | INTRAMUSCULAR | Status: DC
  Filled 2015-09-04: qty 0.5

## 2015-09-04 MED ORDER — ACETAMINOPHEN 325 MG PO TABS
650.0000 mg | ORAL_TABLET | Freq: Four times a day (QID) | ORAL | Status: DC | PRN
  Administered 2015-09-05 (×2): 650 mg via ORAL
  Filled 2015-09-04 (×2): qty 2

## 2015-09-04 MED ORDER — ONDANSETRON HCL 4 MG PO TABS: 4.0000 mg | ORAL_TABLET | Freq: Four times a day (QID) | ORAL | Status: DC | PRN

## 2015-09-04 MED ORDER — ACETAMINOPHEN 650 MG RE SUPP: 650.0000 mg | Freq: Four times a day (QID) | RECTAL | Status: DC | PRN

## 2015-09-04 MED ORDER — SODIUM CHLORIDE 0.9 % IV SOLN: INTRAVENOUS | Status: DC

## 2015-09-04 MED ORDER — INSULIN REGULAR HUMAN 100 UNIT/ML IJ SOLN
INTRAMUSCULAR | Status: DC
  Administered 2015-09-04: 7.8 [IU]/h via INTRAVENOUS
  Administered 2015-09-05: 12.2 [IU]/h via INTRAVENOUS
  Administered 2015-09-05: 5.5 [IU]/h via INTRAVENOUS
  Administered 2015-09-05: 9.4 [IU]/h via INTRAVENOUS
  Administered 2015-09-05: 4 [IU]/h via INTRAVENOUS
  Administered 2015-09-05: 14.1 [IU]/h via INTRAVENOUS
  Administered 2015-09-05: 8.1 [IU]/h via INTRAVENOUS
  Administered 2015-09-05 (×2): 1.6 [IU]/h via INTRAVENOUS
  Filled 2015-09-04 (×2): qty 2.5

## 2015-09-04 MED ORDER — INSULIN REGULAR HUMAN 100 UNIT/ML IJ SOLN
INTRAMUSCULAR | Status: DC
  Filled 2015-09-04: qty 2.5

## 2015-09-04 MED ORDER — ONDANSETRON HCL 4 MG/2ML IJ SOLN: 4.0000 mg | Freq: Four times a day (QID) | INTRAMUSCULAR | Status: DC | PRN

## 2015-09-04 MED ORDER — PREDNISONE 10 MG PO TABS: ORAL_TABLET | ORAL | Status: DC

## 2015-09-04 MED ORDER — POTASSIUM CHLORIDE 10 MEQ/100ML IV SOLN: 10.0000 meq | INTRAVENOUS | Status: DC

## 2015-09-04 MED ORDER — SODIUM CHLORIDE 0.9 % IV SOLN
INTRAVENOUS | Status: DC
  Administered 2015-09-04: 17:00:00 via INTRAVENOUS

## 2015-09-04 MED ORDER — SODIUM CHLORIDE 0.9 % IV SOLN: INTRAVENOUS | Status: AC

## 2015-09-04 NOTE — H&P (Signed)
[place] [facility] [place] NAME: Luke Coleman    MR#:  [id number]  DATE OF BIRTH:  [birth year]  DATE OF ADMISSION:  09/04/2015  PRIMARY CARE PHYSICIAN: Rubbie Battiest, NP   REQUESTING/REFERRING PHYSICIAN: Dr. Larae Grooms  CHIEF COMPLAINT:   Chief Complaint  Patient presents with  . Fatigue   increasing thirst, frequency and hyperglycemia  HISTORY OF PRESENT ILLNESS:  Luke Coleman  is a 65 y.o. male with a known history of diabetes, hypertension, COPD, obstructive sleep apnea, BPH, hypertension who presents to the hospital due to weakness fatigue and also having increasing thirst and urinary frequency. He checked his blood sugars at home and they're noted to be critically high and therefore came to the for further evaluation. Patient was seen by urology recently due to increasing urinary frequency. He was started on finasteride and was advised that have his symptoms do not improve he will need a invasive procedure. Patient says that for the past month he has been having increasing thirst and frequency. He has not checked his blood sugars regularly and does not follow a strict diabetic diet. She has also had about a 20 pound weight loss over the past month with night sweats. He presents to the emergency room and was noted to be severely hyperglycemic and noted to be in acute diabetic ketoacidosis. Hospitalist services were contacted for further treatment and evaluation.  PAST MEDICAL HISTORY:   Past Medical History  Diagnosis Date  . Diabetes mellitus without complication ([place])   . Hypertension   . COPD (chronic obstructive pulmonary disease) ([place])   . Sleep apnea     on CPAP  . Enlarged prostate   . HTN (hypertension) 03/01/2013  . Essential (primary) hypertension 03/01/2013    Overview:  Last Assessment & Plan:  Taking meds as prescribed. Check bmet. Continue to follow.   . Acute bacterial sinusitis 08/22/2014    PAST SURGICAL  HISTORY:   Past Surgical History  Procedure Laterality Date  . Appendectomy    . Hernia repair    . Small intestine surgery      Adhesions  . Nasal septum surgery      For deviated septum  . Circumcision  Age 14    SOCIAL HISTORY:   Social History  Substance Use Topics  . Smoking status: Never Smoker   . Smokeless tobacco: Never Used  . Alcohol Use: Yes     Comment: Occasional    FAMILY HISTORY:   Family History  Problem Relation Age of Onset  . Early death Mother     MVA - died age 47  . Alcohol abuse Father   . Hypertension Father   . Cancer Father 53    renal cell carcinoma  . Heart disease Father 79    MI - died  . Diabetes Brother   . Diabetes Maternal Uncle   . Diabetes Paternal Aunt   . Kidney disease Maternal Grandmother     DRUG ALLERGIES:  No Known Allergies  REVIEW OF SYSTEMS:   Review of Systems  Constitutional: Negative for fever and weight loss.  HENT: Negative for congestion, nosebleeds and tinnitus.   Eyes: Negative for blurred vision, double vision and redness.  Respiratory: Negative for cough, hemoptysis and shortness of breath.   Cardiovascular: Negative for chest pain, orthopnea, leg swelling and PND.  Gastrointestinal: Positive for heartburn and nausea. Negative for vomiting, abdominal pain, diarrhea and melena.  Genitourinary: Positive for urgency and frequency.  Negative for dysuria and hematuria.  Musculoskeletal: Negative for joint pain and falls.  Neurological: Positive for weakness. Negative for dizziness, tingling, sensory change, focal weakness, seizures and headaches.  Endo/Heme/Allergies: Negative for polydipsia. Does not bruise/bleed easily.  Psychiatric/Behavioral: Negative for depression and memory loss. The patient is not nervous/anxious.     MEDICATIONS AT HOME:   Prior to Admission medications   Medication Sig Start Date End Date Taking? Authorizing Provider  finasteride (PROSCAR) 5 MG tablet Take 5 mg by mouth daily.     Yes Historical Provider, MD  fluticasone (FLONASE) 50 MCG/ACT nasal spray Place 2 sprays into both nostrils daily. Patient taking differently: Place 2 sprays into both nostrils daily as needed for rhinitis.  07/22/15  Yes Coral Spikes, DO  lisinopril-hydrochlorothiazide (PRINZIDE,ZESTORETIC) 20-12.5 MG per tablet Take 1 tablet by mouth daily. 03/29/15  Yes Rubbie Battiest, NP  metFORMIN (GLUCOPHAGE) 1000 MG tablet Take 1 tablet (1,000 mg total) by mouth 2 (two) times daily with a meal. 09/04/15  Yes Rubbie Battiest, NP  Multiple Vitamin (MULTIVITAMIN WITH MINERALS) TABS tablet Take 1 tablet by mouth daily.   Yes Historical Provider, MD  tadalafil (CIALIS) 5 MG tablet Take 5 mg by mouth daily.   Yes Historical Provider, MD      VITAL SIGNS:  Blood pressure 125/85, pulse 107, temperature 97.4 F (36.3 C), temperature source Oral, resp. rate 20, height 5\' 7"  (1.702 m), weight 92.534 kg (204 lb), SpO2 97 %.  PHYSICAL EXAMINATION:  Physical Exam  GENERAL:  65 y.o.-year-old patient lying in the bed with no acute distress.  EYES: Pupils equal, round, reactive to light and accommodation. No scleral icterus. Extraocular muscles intact.  HEENT: Head atraumatic, normocephalic. Oropharynx and nasopharynx clear. No oropharyngeal erythema, moist oral mucosa  NECK:  Supple, no jugular venous distention. No thyroid enlargement, no tenderness.  LUNGS: Normal breath sounds bilaterally, no wheezing, rales, rhonchi. No use of accessory muscles of respiration.  CARDIOVASCULAR: S1, S2 RRR. No murmurs, rubs, gallops, clicks.  ABDOMEN: Soft, nontender, nondistended. Bowel sounds present. No organomegaly or mass.  EXTREMITIES: No pedal edema, cyanosis, or clubbing. + 2 pedal & radial pulses b/l.   NEUROLOGIC: Cranial nerves II through XII are intact. No focal Motor or sensory deficits appreciated b/l PSYCHIATRIC: The patient is alert and oriented x 3. Good affect.  SKIN: No obvious rash, lesion, or ulcer.    LABORATORY PANEL:   CBC  Recent Labs Lab 09/04/15 1310  WBC 13.7*  HGB 17.0  HCT 51.5  PLT 234   ------------------------------------------------------------------------------------------------------------------  Chemistries   Recent Labs Lab 09/04/15 1310  NA 130*  K 5.3*  CL 90*  CO2 20*  GLUCOSE 774*  BUN 38*  CREATININE 1.79*  CALCIUM 9.7   ------------------------------------------------------------------------------------------------------------------  Cardiac Enzymes  Recent Labs Lab 09/04/15 1310  TROPONINI <0.03   ------------------------------------------------------------------------------------------------------------------  RADIOLOGY:  Dg Chest 2 View  09/04/2015  CLINICAL DATA:  Bronchitis. Chest discomfort and heaviness. Hypertension. EXAM: CHEST  2 VIEW COMPARISON:  None. FINDINGS: The heart size and mediastinal contours are within normal limits. Both lungs are clear. The visualized skeletal structures are unremarkable. IMPRESSION: No active cardiopulmonary disease. Electronically Signed   By: Earle Gell M.D.   On: 09/04/2015 14:19     IMPRESSION AND PLAN:   65 year old male with past medical history of diabetes, hypertension, BPH, presents to the hospital due to fatigue, increasing thirst, increasing urinary frequency and noted to be in acute diabetic ketoacidosis.  #1 acute DKA-patient apparently  does not follow a strict diabetic diet and is only on oral meds presently. -This is the cause of patient's increasing thirst, frequency and elevated blood sugars. -I will start the patient on IV fluids, insulin drip. Follow serial metabolic profiles, we'll get a diabetic lifestyle assessment. -We will follow electrolytes and add potassium to the fluids once is less than 3.5. Check hemoglobin A1c. -Place him on a clear liquid diet for now.  #2 acute renal failure-secondary to her hyperglycemia and acute DKA. -I will hydrate the patient with IV  fluids and follow BUN/creatinine. Hold lisinopril/HCTZ.  #3 BPH-continue finasteride.  #4 hyponatremia-pseudohyponatremia secondary to the hyperglycemia. -This should improve as his blood sugars correct.  #5 leukocytosis-likely stress mediated from the acute DKA.-No evidence of acute infection. Follow white cell count.    All the records are reviewed and case discussed with ED provider. Management plans discussed with the patient, family and they are in agreement.  CODE STATUS: Full Code  TOTAL Critical Care TIME TAKING CARE OF THIS PATIENT: 45 minutes.    Henreitta Leber M.D on 09/04/2015 at 3:32 PM  Between 7am to 6pm - Pager - [phone]  After 6pm go to [email or web address] - password [email or web address] [facility]  Office  [phone]  CC: Primary care physician; Rubbie Battiest, NP

## 2015-09-04 NOTE — Progress Notes (Signed)
eLink Physician-Brief Progress Note Patient Name: Luke Coleman DOB: 01/30/1951 MRN: [id number]   Date of Service  09/04/2015  HPI/Events of Note  65 yo male with PMH of DM, HTN, COPD, OSA, BPH and HTN. Admitted with DKA with blood glucose = 774. Current medical management includes Proscar and an Insulin IV infusion. Management per Hospitalist Team. Last blood glucose = 481. Afebrile and VSS.   eICU Interventions  Continue present management.      Intervention Category Evaluation Type: New Patient Evaluation  Lysle Dingwall 09/04/2015, 6:34 PM

## 2015-09-04 NOTE — Telephone Encounter (Signed)
Patient called the triage phone to schedule an appt.  Has one today at 1130 with you.   FYI his Blood sugar this am is Greater then 500.

## 2015-09-04 NOTE — ED Notes (Signed)
Sent to ED Dr Flonnie Overman from [facility] [facility].  State has been feeling weak recently and went to office, unable to draw blood there.  Sent to ED for being dehydrated.  Patient states feeling weak recently and had high blood sugar on meter last pm.

## 2015-09-04 NOTE — ED Notes (Signed)
Pt transferred to ICU with insulin drip and NS. Pt in hallway bed and meds could not be scanned. Pt setup and montitored by glucose stabilizer.

## 2015-09-04 NOTE — Telephone Encounter (Signed)
Saw pt. Sent to ED for possible DKA

## 2015-09-04 NOTE — ED Notes (Signed)
Pt to ER because blood sugar has been high since last night per patient report. Pt states that he is pre diabetic and takes metformin. Pt dx with bronchitis last week by PCP and was prescribed antibiotics per patient report. Pt also prescribed steroids, but states that he did not get them filled. Denies pain at this time. Intermittent heaviness to chest per patient.

## 2015-09-04 NOTE — ED Provider Notes (Signed)
[facility] Emergency Department Provider Note  ____________________________________________  Time seen: Approximately 140 PM  I have reviewed the triage vital signs and the nursing notes.   HISTORY  Chief Complaint Fatigue    HPI Luke Coleman is a 65 y.o. male with a history of prediabetes not on any medication was presenting today with 4 days of urinary frequency as well as dry mouth. He is also complaining of intermittent chest pressure to the center of his chest which is nonexertional and does not have any exacerbating factors. Denies any nausea or vomiting with this. Says he had a catheterization about 20 years ago and was told he had "a virus in his heart."   Past Medical History  Diagnosis Date  . Diabetes mellitus without complication ([place])   . Hypertension   . COPD (chronic obstructive pulmonary disease) ([place])   . Sleep apnea     on CPAP  . Enlarged prostate   . HTN (hypertension) 03/01/2013  . Essential (primary) hypertension 03/01/2013    Overview:  Last Assessment & Plan:  Taking meds as prescribed. Check bmet. Continue to follow.   . Acute bacterial sinusitis 08/22/2014    Patient Active Problem List   Diagnosis Date Noted  . Sinusitis, maxillary, chronic 08/28/2015  . URI (upper respiratory infection) 07/22/2015  . Prostatitis 04/22/2015  . Obese 05/23/2013  . Excess weight 05/23/2013  . HTN (hypertension) 03/01/2013  . Essential (primary) hypertension 03/01/2013  . ED (erectile dysfunction) of organic origin 03/01/2013  . Diabetes mellitus, type 2 ([place]) 03/01/2013    Past Surgical History  Procedure Laterality Date  . Appendectomy    . Hernia repair    . Small intestine surgery      Adhesions  . Nasal septum surgery      For deviated septum  . Circumcision  Age 43    Current Outpatient Rx  Name  Route  Sig  Dispense  Refill  . benzonatate (TESSALON) 100 MG capsule      Reported on 09/04/2015         . finasteride  (PROSCAR) 5 MG tablet               . fluticasone (FLONASE) 50 MCG/ACT nasal spray   Each Nare   Place 2 sprays into both nostrils daily.   16 g   6   . lisinopril-hydrochlorothiazide (PRINZIDE,ZESTORETIC) 20-12.5 MG per tablet   Oral   Take 1 tablet by mouth daily.   90 tablet   3   . metFORMIN (GLUCOPHAGE) 1000 MG tablet   Oral   Take 1 tablet (1,000 mg total) by mouth 2 (two) times daily with a meal.   60 tablet   0   . Multiple Vitamin (MULTIVITAMIN) tablet   Oral   Take 1 tablet by mouth daily.         . phentermine (ADIPEX-P) 37.5 MG tablet   Oral   Take by mouth. Reported on 09/04/2015         . tadalafil (CIALIS) 5 MG tablet   Oral   Take 5 mg by mouth daily.         . tamsulosin (FLOMAX) 0.4 MG CAPS capsule      Reported on 09/04/2015           Allergies Review of patient's allergies indicates no known allergies.  Family History  Problem Relation Age of Onset  . Early death Mother     MVA - died age 46  .  Alcohol abuse Father   . Hypertension Father   . Cancer Father 35    renal cell carcinoma  . Heart disease Father 87    MI - died  . Diabetes Brother   . Diabetes Maternal Uncle   . Diabetes Paternal Aunt   . Kidney disease Maternal Grandmother     Social History Social History  Substance Use Topics  . Smoking status: Never Smoker   . Smokeless tobacco: Never Used  . Alcohol Use: Yes     Comment: Occasional    Review of Systems Constitutional: No fever/chills Eyes: No visual changes. ENT: No sore throat. Cardiovascular: As above  Respiratory: Denies shortness of breath. Gastrointestinal: No abdominal pain.  No nausea, no vomiting.  No diarrhea.  No constipation. Genitourinary: Negative for dysuria. Musculoskeletal: Negative for back pain. Skin: Negative for rash. Neurological: Negative for headaches, focal weakness or numbness.  10-point ROS otherwise  negative.  ____________________________________________   PHYSICAL EXAM:  VITAL SIGNS: ED Triage Vitals  Enc Vitals Group     BP 09/04/15 1253 125/85 mmHg     Pulse Rate 09/04/15 1253 107     Resp 09/04/15 1253 20     Temp 09/04/15 1253 97.4 F (36.3 C)     Temp Source 09/04/15 1253 Oral     SpO2 09/04/15 1253 97 %     Weight 09/04/15 1253 204 lb (92.534 kg)     Height 09/04/15 1253 5\' 7"  (1.702 m)     Head Cir --      Peak Flow --      Pain Score 09/04/15 1301 0     Pain Loc --      Pain Edu? --      Excl. in [place]? --     Constitutional: Alert and oriented. Well appearing and in no acute distress. Eyes: Conjunctivae are normal. PERRL. EOMI. Head: Atraumatic. Nose: No congestion/rhinnorhea. Mouth/Throat: Mucous membranes are dry.  Oropharynx non-erythematous. Neck: No stridor.   Cardiovascular: Normal rate, regular rhythm. Grossly normal heart sounds.  Good peripheral circulation. Respiratory: Normal respiratory effort.  No retractions. Lungs CTAB. Gastrointestinal: Soft and nontender. No distention. No abdominal bruits. No CVA tenderness. Musculoskeletal: No lower extremity tenderness nor edema.  No joint effusions. Neurologic:  Normal speech and language. No gross focal neurologic deficits are appreciated. No gait instability. Skin:  Skin is warm, dry and intact. No rash noted. Psychiatric: Mood and affect are normal. Speech and behavior are normal.  ____________________________________________   LABS (all labs ordered are listed, but only abnormal results are displayed)  Labs Reviewed  BASIC METABOLIC PANEL - Abnormal; Notable for the following:    Sodium 130 (*)    Potassium 5.3 (*)    Chloride 90 (*)    CO2 20 (*)    Glucose, Bld 774 (*)    BUN 38 (*)    Creatinine, Ser 1.79 (*)    GFR calc non Af Amer 39 (*)    GFR calc Af Amer 45 (*)    Anion gap 20 (*)    All other components within normal limits  CBC - Abnormal; Notable for the following:    WBC 13.7  (*)    All other components within normal limits  URINALYSIS COMPLETEWITH MICROSCOPIC ([facility] ONLY) - Abnormal; Notable for the following:    Color, Urine STRAW (*)    APPearance CLEAR (*)    Glucose, UA >500 (*)    Ketones, ur 1+ (*)    All other components  within normal limits  GLUCOSE, CAPILLARY - Abnormal; Notable for the following:    Glucose-Capillary 598 (*)    All other components within normal limits  TROPONIN I  CBG MONITORING, ED   ____________________________________________  EKG  ED ECG REPORT I, [place],  Youlanda Roys, the attending physician, personally viewed and interpreted this ECG.   Date: 09/04/2015  EKG Time: 1320  Rate: 100  Rhythm: normal EKG, normal sinus rhythm  Axis: Normal axis  Intervals:none  ST&T Change: No ST segment elevation or depression. No abnormal T-wave inversion. PVC times one.  ____________________________________________  RADIOLOGY  No active cardiopulmonary disease. ____________________________________________   PROCEDURES  CRITICAL CARE Performed by: Doran Stabler   Total critical care time: 35 minutes  Critical care time was exclusive of separately billable procedures and treating other patients.  Critical care was necessary to treat or prevent imminent or life-threatening deterioration.  Critical care was time spent personally by me on the following activities: development of treatment plan with patient and/or surrogate as well as nursing, discussions with consultants, evaluation of patient's response to treatment, examination of patient, obtaining history from patient or surrogate, ordering and performing treatments and interventions, ordering and review of laboratory studies, ordering and review of radiographic studies, pulse oximetry and re-evaluation of patient's condition.  ____________________________________________   INITIAL IMPRESSION / ASSESSMENT AND PLAN / ED COURSE  Pertinent labs & imaging results that  were available during my care of the patient were reviewed by me and considered in my medical decision making (see chart for details).  ----------------------------------------- 2:27 PM on 09/04/2015 -----------------------------------------  Patient resting comfortably. Patient with new onset diabetes with diabetic ketoacidosis with an anion gap of 20. We'll be started on an insulin drip. Explained to the patient as well as his wife and he will need to be admitted to the hospital. Signed out to Dr. Vella Kohler.   ____________________________________________   FINAL CLINICAL IMPRESSION(S) / ED DIAGNOSES  DKA    Orbie Pyo, MD 09/04/15 [phone]

## 2015-09-04 NOTE — Assessment & Plan Note (Addendum)
Worsening. Unable to gain access for blood draw. Dehydrated and frequent urination. Tachycardia and wife said he had confusion last night. 426 BS this morning, unable to read last night, took 2 metformin and then checked later it was 550 he reports. Does NOT check regularly. Suspect DKA- sending to ER. Spoke with charge nurse on phone to alert of his arrival by POV (wife driving).  No way to check BS at office today (no POCT glucometer)

## 2015-09-04 NOTE — Patient Instructions (Addendum)
Please take two of your metformin 500 mg to make a 1,000 mg dosage take in the morning and evening. When you run out- take the 1,000 mg 1 tablet in the morning and 1 tablet in the evening WITH MEALS  Watch sweets, sugary beverages, rice, pasta, or bread for a few weeks.   Check blood sugar first thing in the morning and 2 hours after biggest meal. I will call tomorrow and get your readings from today and tomorrow morning.   Follow up in 1 week

## 2015-09-04 NOTE — Progress Notes (Signed)
Patient ID: Luke Coleman, male    DOB: 05/05/1951  Age: 65 y.o. MRN: [id number]  CC: Diabetes   HPI Luke Coleman presents for CC of elevated glucose since last night. He is accompanied by his wife today.  1)  Pt appears ill and disheveled today. His wife reports he was having confusion last night after getting up he asked where he had been. He has been fatigued, frequently urinating, thirsty, and weak.   He last took his blood sugar last night. His meter reported HIGH then he reports he took two metformin at dinner. Recheck of BS was 550 and he took 2 more metformin afterwards.   426 this morning   Asked him when he checked his BS previous to this and he reports last month and it was 137.  Last A1c 6.1 in August Was taking Tessalon perles and Augmentin for illness- which he stopped at the advice of his urologist yesterday.   Called in Prednisone this morning because he reported a cough after having to stop the other medications- this was never picked up or started  Urinated twice in 20 minutes at office  History Chais has a past medical history of Diabetes mellitus without complication (HCC); Hypertension; COPD (chronic obstructive pulmonary disease) (HCC); Sleep apnea; Enlarged prostate; HTN (hypertension) (03/01/2013); Essential (primary) hypertension (03/01/2013); and Acute bacterial sinusitis (08/22/2014).   He has past surgical history that includes Appendectomy; Hernia repair; Small intestine surgery; Nasal septum surgery; and Circumcision (Age 53).   His family history includes Alcohol abuse in his father; Cancer (age of onset: 60) in his father; Diabetes in his brother, maternal uncle, and paternal aunt; Early death in his mother; Heart disease (age of onset: 63) in his father; Hypertension in his father; Kidney disease in his maternal grandmother.He reports that he has never smoked. He has never used smokeless tobacco. He reports that he drinks alcohol. He reports that he does not use  illicit drugs.  Outpatient Prescriptions Prior to Visit  Medication Sig Dispense Refill  . finasteride (PROSCAR) 5 MG tablet     . fluticasone (FLONASE) 50 MCG/ACT nasal spray Place 2 sprays into both nostrils daily. 16 g 6  . lisinopril-hydrochlorothiazide (PRINZIDE,ZESTORETIC) 20-12.5 MG per tablet Take 1 tablet by mouth daily. 90 tablet 3  . Multiple Vitamin (MULTIVITAMIN) tablet Take 1 tablet by mouth daily.    . tadalafil (CIALIS) 5 MG tablet Take 5 mg by mouth daily.    . metFORMIN (GLUCOPHAGE) 500 MG tablet TAKE ONE TABLET BY MOUTH TWICE DAILY WITH  A  MEAL 180 tablet 0  . benzonatate (TESSALON) 100 MG capsule Reported on 09/04/2015    . phentermine (ADIPEX-P) 37.5 MG tablet Take by mouth. Reported on 09/04/2015    . tamsulosin (FLOMAX) 0.4 MG CAPS capsule Reported on 09/04/2015    . amoxicillin-clavulanate (AUGMENTIN) 875-125 MG tablet Take 1 tablet by mouth 2 (two) times daily. (Patient not taking: Reported on 09/04/2015) 14 tablet 0  . guaiFENesin-codeine (ROBITUSSIN AC) 100-10 MG/5ML syrup Take 5 mLs by mouth at bedtime. (Patient not taking: Reported on 09/03/2015) 240 mL 0  . ondansetron (ZOFRAN) 4 MG tablet Take 1 tablet (4 mg total) by mouth every 8 (eight) hours as needed for nausea or vomiting. (Patient not taking: Reported on 09/03/2015) 20 tablet 0  . predniSONE (DELTASONE) 10 MG tablet Take 6 tablets by mouth on day 1 with breakfast then decrease by 1 tablet each day until gone. (Patient not taking: Reported on 09/04/2015) 21 tablet 0     No facility-administered medications prior to visit.    ROS Review of Systems  Constitutional: Positive for unexpected weight change. Negative for fever, chills, diaphoresis and fatigue.       Decrease in weight over past few months   HENT: Positive for congestion. Negative for tinnitus and trouble swallowing.   Eyes: Negative for visual disturbance.  Respiratory: Positive for cough. Negative for chest tightness, shortness of breath and  wheezing.   Cardiovascular: Negative for chest pain, palpitations and leg swelling.  Gastrointestinal: Negative for nausea, vomiting, abdominal pain, diarrhea, constipation, blood in stool, abdominal distention and anal bleeding.  Endocrine: Positive for polydipsia and polyuria. Negative for polyphagia.  Skin: Negative for color change and rash.  Neurological: Positive for weakness. Negative for dizziness and numbness.  Psychiatric/Behavioral: The patient is not nervous/anxious.     Objective:  BP 108/74 mmHg  Pulse 109  Temp(Src) 97.7 F (36.5 C) (Oral)  Resp 16  Ht 5' 7" (1.702 m)  Wt 204 lb 3.2 oz (92.625 kg)  BMI 31.97 kg/m2  SpO2 96%  Physical Exam  Constitutional: He is oriented to person, place, and time. He appears well-developed and well-nourished. He appears distressed.  Looks disheveled compared to last time he was in  HENT:  Head: Normocephalic and atraumatic.  Right Ear: External ear normal.  Left Ear: External ear normal.  Mouth/Throat: No oropharyngeal exudate.  Tongue is dry  Pulmonary/Chest: Effort normal.  Neurological: He is alert and oriented to person, place, and time. Coordination abnormal.  Gait unsteady  Skin: Skin is warm and dry. No rash noted. He is not diaphoretic.  Poor skin turgor   Assessment & Plan:   Zaylen was seen today for diabetes.  Diagnoses and all orders for this visit:  Type 2 diabetes mellitus with complication, without long-term current use of insulin (HCC) -     HgB A1c -     Comp Met (CMET)  Other orders -     metFORMIN (GLUCOPHAGE) 1000 MG tablet; Take 1 tablet (1,000 mg total) by mouth 2 (two) times daily with a meal.   I have discontinued Mr. Luke Coleman's metFORMIN, guaiFENesin-codeine, amoxicillin-clavulanate, ondansetron, and predniSONE. I am also having him start on metFORMIN. Additionally, I am having him maintain his multivitamin, lisinopril-hydrochlorothiazide, fluticasone, phentermine, benzonatate, finasteride,  tadalafil, and tamsulosin.  Meds ordered this encounter  Medications  . metFORMIN (GLUCOPHAGE) 1000 MG tablet    Sig: Take 1 tablet (1,000 mg total) by mouth 2 (two) times daily with a meal.    Dispense:  60 tablet    Refill:  0    Order Specific Question:  Supervising Provider    Answer:  TULLO, TERESA L [[id number]]     Follow-up: Return in about 1 week (around 09/11/2015) for DM follow up. 

## 2015-09-05 LAB — GLUCOSE, CAPILLARY
GLUCOSE-CAPILLARY: 157 mg/dL — AB (ref 65–99)
GLUCOSE-CAPILLARY: 188 mg/dL — AB (ref 65–99)
GLUCOSE-CAPILLARY: 353 mg/dL — AB (ref 65–99)
GLUCOSE-CAPILLARY: 113 mg/dL — AB (ref 65–99)
GLUCOSE-CAPILLARY: 135 mg/dL — AB (ref 65–99)
GLUCOSE-CAPILLARY: 225 mg/dL — AB (ref 65–99)
GLUCOSE-CAPILLARY: 205 mg/dL — AB (ref 65–99)
GLUCOSE-CAPILLARY: 123 mg/dL — AB (ref 65–99)
Glucose-Capillary: 148 mg/dL — ABNORMAL HIGH (ref 65–99)
Glucose-Capillary: 159 mg/dL — ABNORMAL HIGH (ref 65–99)
Glucose-Capillary: 162 mg/dL — ABNORMAL HIGH (ref 65–99)
Glucose-Capillary: 168 mg/dL — ABNORMAL HIGH (ref 65–99)
Glucose-Capillary: 169 mg/dL — ABNORMAL HIGH (ref 65–99)
Glucose-Capillary: 179 mg/dL — ABNORMAL HIGH (ref 65–99)
Glucose-Capillary: 195 mg/dL — ABNORMAL HIGH (ref 65–99)
Glucose-Capillary: 209 mg/dL — ABNORMAL HIGH (ref 65–99)
Glucose-Capillary: 212 mg/dL — ABNORMAL HIGH (ref 65–99)
Glucose-Capillary: 240 mg/dL — ABNORMAL HIGH (ref 65–99)
Glucose-Capillary: 249 mg/dL — ABNORMAL HIGH (ref 65–99)
Glucose-Capillary: 266 mg/dL — ABNORMAL HIGH (ref 65–99)
Glucose-Capillary: 295 mg/dL — ABNORMAL HIGH (ref 65–99)

## 2015-09-05 LAB — BASIC METABOLIC PANEL
Anion gap: 6 (ref 5–15)
BUN: 33 mg/dL — AB (ref 6–20)
CHLORIDE: 106 mmol/L (ref 101–111)
CO2: 26 mmol/L (ref 22–32)
CREATININE: 1.22 mg/dL (ref 0.61–1.24)
Calcium: 8.5 mg/dL — ABNORMAL LOW (ref 8.9–10.3)
GFR calc non Af Amer: >60 mL/min (ref >60)
Glucose, Bld: 207 mg/dL — ABNORMAL HIGH (ref 65–99)
POTASSIUM: 3.4 mmol/L — AB (ref 3.5–5.1)
Sodium: 138 mmol/L (ref 135–145)

## 2015-09-05 LAB — CBC
HEMATOCRIT: 41.9 % (ref 40.0–52.0)
Hemoglobin: 14.6 g/dL (ref 13.0–18.0)
MCH: 31.4 pg (ref 26.0–34.0)
MCHC: 34.7 g/dL (ref 32.0–36.0)
MCV: 90.6 fL (ref 80.0–100.0)
PLATELETS: 181 10*3/uL (ref 150–440)
RBC: 4.63 MIL/uL (ref 4.40–5.90)
RDW: 12.3 % (ref 11.5–14.5)
WBC: 11.3 10*3/uL — ABNORMAL HIGH (ref 3.8–10.6)

## 2015-09-05 LAB — MAGNESIUM: MAGNESIUM: 2 mg/dL (ref 1.7–2.4)

## 2015-09-05 LAB — HEMOGLOBIN A1C: Hgb A1c MFr Bld: 11 % — ABNORMAL HIGH (ref 4.0–6.0)

## 2015-09-05 LAB — PHOSPHORUS: Phosphorus: 3.3 mg/dL (ref 2.5–4.6)

## 2015-09-05 MED ORDER — POTASSIUM CHLORIDE 20 MEQ PO PACK
40.0000 meq | PACK | Freq: Once | ORAL | Status: AC
  Administered 2015-09-05: 40 meq via ORAL
  Filled 2015-09-05: qty 2

## 2015-09-05 NOTE — Progress Notes (Signed)
Pts blood sugar elevated at 353 after he ate a clear liquid diet carbohydrates not covered with insulin bolus increased insulin drip based on glucostabilizer recommendations discussed with inpatient diabetes coordinator will continue to monitor and assess pt

## 2015-09-05 NOTE — Plan of Care (Signed)
Problem: Food- and Nutrition-Related Knowledge Deficit (NB-1.1) Goal: Nutrition education Formal process to instruct or train a patient/client in a skill or to impart knowledge to help patients/clients voluntarily manage or modify food choices and eating behavior to maintain or improve health. Outcome: Completed/Met Date Met:  09/05/15  RD consulted for nutrition education regarding diabetes.     Lab Results  Component Value Date    HGBA1C 11.0* 09/04/2015    RD provided "Carbohydrate Counting for People with Diabetes" handout from the Academy of Nutrition and Dietetics. Discussed different food groups and their effects on blood sugar, emphasizing carbohydrate-containing foods. Provided list of carbohydrates and recommended serving sizes of common foods. Also educated on label reading.  Discussed importance of controlled and consistent carbohydrate intake throughout the day. Provided examples of ways to balance meals/snacks and encouraged intake of high-fiber, whole grain complex carbohydrates. Teach back method used.  Pt and his wife very receptive to education, asked many questions and all questions answered. Expect good compliance.   Kerman Passey Silo, [place], LDN [phone] Pager  [phone] Weekend/On-Call Pager

## 2015-09-05 NOTE — Progress Notes (Signed)
[facility] [place] [facility] [place] NAME: Kunj Safrit    MR#:  [id number]  DATE OF BIRTH:  May 04, 1951  SUBJECTIVE:  CHIEF COMPLAINT:  Patient is resting comfortably. Denies any abdominal pain. Feeling better still complaining of frequent urination,He denies any back pain  REVIEW OF SYSTEMS:  CONSTITUTIONAL: No fever, fatigue or weakness.  EYES: No blurred or double vision.  EARS, NOSE, AND THROAT: No tinnitus or ear pain.  RESPIRATORY: No cough, shortness of breath, wheezing or hemoptysis.  CARDIOVASCULAR: No chest pain, orthopnea, edema.  GASTROINTESTINAL: No nausea, vomiting, diarrhea or abdominal pain.  GENITOURINARY: No dysuria, hematuria.  ENDOCRINE: No polyuria, nocturia,  HEMATOLOGY: No anemia, easy bruising or bleeding SKIN: No rash or lesion. MUSCULOSKELETAL: No joint pain or arthritis.   NEUROLOGIC: No tingling, numbness, weakness.  PSYCHIATRY: No anxiety or depression.   DRUG ALLERGIES:  No Known Allergies  VITALS:  Blood pressure 133/90, pulse 95, temperature 98.1 F (36.7 C), temperature source Oral, resp. rate 13, height 5\' 7"  (1.702 m), weight 88.2 kg (194 lb 7.1 oz), SpO2 99 %.  PHYSICAL EXAMINATION:  GENERAL:  65 y.o.-year-old patient lying in the bed with no acute distress.  EYES: Pupils equal, round, reactive to light and accommodation. No scleral icterus. Extraocular muscles intact.  HEENT: Head atraumatic, normocephalic. Oropharynx and nasopharynx clear.  NECK:  Supple, no jugular venous distention. No thyroid enlargement, no tenderness.  LUNGS: Normal breath sounds bilaterally, no wheezing, rales,rhonchi or crepitation. No use of accessory muscles of respiration.  CARDIOVASCULAR: S1, S2 normal. No murmurs, rubs, or gallops.  ABDOMEN: Soft, nontender, nondistended. Bowel sounds present. No organomegaly or mass.  EXTREMITIES: No pedal edema, cyanosis, or clubbing.  NEUROLOGIC: Cranial nerves II through XII are intact.  Muscle strength 5/5 in all extremities. Sensation intact. Gait not checked.  PSYCHIATRIC: The patient is alert and oriented x 3.  SKIN: No obvious rash, lesion, or ulcer.    LABORATORY PANEL:   CBC  Recent Labs Lab 09/05/15 0642  WBC 11.3*  HGB 14.6  HCT 41.9  PLT 181   ------------------------------------------------------------------------------------------------------------------  Chemistries   Recent Labs Lab 09/05/15 0642  NA 138  K 3.4*  CL 106  CO2 26  GLUCOSE 207*  BUN 33*  CREATININE 1.22  CALCIUM 8.5*  MG 2.0   ------------------------------------------------------------------------------------------------------------------  Cardiac Enzymes  Recent Labs Lab 09/04/15 1310  TROPONINI <0.03   ------------------------------------------------------------------------------------------------------------------  RADIOLOGY:  Dg Chest 2 View  09/04/2015  CLINICAL DATA:  Bronchitis. Chest discomfort and heaviness. Hypertension. EXAM: CHEST  2 VIEW COMPARISON:  None. FINDINGS: The heart size and mediastinal contours are within normal limits. Both lungs are clear. The visualized skeletal structures are unremarkable. IMPRESSION: No active cardiopulmonary disease. Electronically Signed   By: Earle Gell M.D.   On: 09/04/2015 14:19    EKG:   Orders placed or performed during the hospital encounter of 09/04/15  . ED EKG  . ED EKG  . EKG 12-Lead  . EKG 12-Lead    ASSESSMENT AND PLAN:  65 year old male with past medical history of diabetes, hypertension, BPH, presents to the hospital due to fatigue, increasing thirst, increasing urinary frequency and noted to be in acute diabetic ketoacidosis.  #1 acute DKA-with  uncontrolled diabetes mellitus secondary to noncompliance with the diet  -Hemoglobin A1c at 11 -This is the cause of patient's polyphagia and polyuria -Continue IV fluids, insulin drip per protocol - Follow serial metabolic profiles, we'll get a  diabetic lifestyle assessment. -We will  do is we'll start the patient and 26 units of subcutaneous Lantus when blood sugar reaches at 250, 2 hours after giving the subcutaneous Lantus will consider stopping the insulin drip   #2 acute renal failure-secondary to her hyperglycemia and acute DKA. Creatinine 1.79-1.49-1.2  -Clinically improving with IV fluids. A repeat BMP in a.m. -Holding hydrochlorothiazide  #3 BPH-continue finasteride.  #4 hyponatremia-pseudohyponatremia secondary to the hyperglycemia. -This should improve as his blood sugars correct.  #5 leukocytosis-likely stress mediated from the acute DKA.-No evidence of acute infection. Follow white cell count. 13.7-11.3        All the records are reviewed and case discussed with Care Management/Social Workerr. Management plans discussed with the patient, family and they are in agreement.  CODE STATUS: Full code  TOTAL CRITICAL CARE TIME TAKING CARE OF THIS PATIENT: 45 minutes.   POSSIBLE D/C IN 1-2DAYS, DEPENDING ON CLINICAL CONDITION.   Nicholes Mango M.D on 09/05/2015 at 1:46 PM  Between 7am to 6pm - Pager - [phone] After 6pm go to [email or web address] - password [email or web address]  Office  [phone]  CC: Primary care physician; Rubbie Battiest, NP

## 2015-09-05 NOTE — Progress Notes (Signed)
Initial Nutrition Assessment   INTERVENTION:   Meals and Snacks: Cater to patient preferences Education: provided verbal/written education on diabetic diet to pt and his wife; very receptive to education, pt reports no prior education on diet. All questions answered, adherence likely   NUTRITION DIAGNOSIS:   Food and nutrition related knowledge deficit related to chronic illness as evidenced by  (consult for education).  GOAL:   Patient will meet greater than or equal to 90% of their needs  MONITOR:    (Energy Intake, Anthropometrics, Digestive System, Electrolyte/Renal Profile, Glucose Profile)  REASON FOR ASSESSMENT:   Consult Diet education  ASSESSMENT:    Pt admitted with acute DKA with uncontrolled DM, HgbA1c 11; ARF  Past Medical History  Diagnosis Date  . Diabetes mellitus without complication ([place])   . Hypertension   . COPD (chronic obstructive pulmonary disease) ([place])   . Sleep apnea     on CPAP  . Enlarged prostate   . HTN (hypertension) 03/01/2013  . Essential (primary) hypertension 03/01/2013    Overview:  Last Assessment & Plan:  Taking meds as prescribed. Check bmet. Continue to follow.   . Acute bacterial sinusitis 08/22/2014     Diet Order:  Diet Carb Modified Fluid consistency:: Thin; Room service appropriate?: Yes   Energy Intake: appetite good at present, eating 100% of meals  Food and Nutrition Related History: pt reports very good appetite, eating well up until right before admission  Electrolyte and Renal Profile:  Recent Labs Lab 09/04/15 1656 09/04/15 1922 09/05/15 0642  BUN 37* 33* 33*  CREATININE 1.68* 1.49* 1.22  NA 135 134* 138  K 4.1 3.7 3.4*  MG  --   --  2.0  PHOS  --   --  3.3   Glucose Profile:   Recent Labs  09/05/15 1201 09/05/15 1341 09/05/15 1442  GLUCAP 113* 209* 249*   Meds: inulin drip, D5-1/2 NS at 183ml/hr  Nutrition Focused Physical Exam: Nutrition-Focused physical exam completed. Findings are WDL for  fat depletion, muscle depletion, and edema.    Height:   Ht Readings from Last 1 Encounters:  09/04/15 5\' 7"  (1.702 m)    Weight: pt reports 20 pound wt loss over past several months; 9.3% wt loss  Wt Readings from Last 1 Encounters:  09/04/15 194 lb 7.1 oz (88.2 kg)    Filed Weights   09/04/15 1253 09/04/15 1300 09/04/15 1638  Weight: 204 lb (92.534 kg) 204 lb (92.534 kg) 194 lb 7.1 oz (88.2 kg)   Wt Readings from Last 10 Encounters:  09/04/15 194 lb 7.1 oz (88.2 kg)  09/04/15 204 lb 3.2 oz (92.625 kg)  09/03/15 203 lb 12.8 oz (92.443 kg)  08/28/15 215 lb (97.523 kg)  07/22/15 222 lb 8 oz (100.925 kg)  07/12/15 225 lb (102.059 kg)  06/11/15 218 lb 12.8 oz (99.247 kg)  05/09/15 220 lb 4.8 oz (99.927 kg)  04/22/15 220 lb 12.8 oz (100.154 kg)  03/29/15 217 lb 3.2 oz (98.521 kg)    BMI:  Body mass index is 30.45 kg/(m^2).  LOW Care Level  Kerman Passey MS, [place], LDN [phone] Pager  [phone] Weekend/On-Call Pager

## 2015-09-05 NOTE — Progress Notes (Signed)
Pt remains alert and oriented; on RA with O2 sats upper 90's; remains on insulin drip receiving insulin bolus based on carbohydrate intake and tolerating well; vss; adequate uop via urinal; sinus rhythm with occasional PVC's; per Dr. Margaretmary Eddy once pt is ready to transition off of insulin drip may use recommendations from inpatient diabetes coordinator; pt updated about plan of care and questions answered

## 2015-09-05 NOTE — Progress Notes (Signed)
Inpatient Diabetes Program Recommendations  AACE/ADA: New Consensus Statement on Inpatient Glycemic Control (2015)  Target Ranges:  Prepandial:   less than 140 mg/dL      Peak postprandial:   less than 180 mg/dL (1-2 hours)      Critically ill patients:  140 - 180 mg/dL   Review of Glycemic Control  Results for Luke Coleman, Luke Coleman (MRN [id number]) as of 09/05/2015 09:06  Ref. Range 09/05/2015 03:32 09/05/2015 04:38 09/05/2015 05:46 09/05/2015 06:48 09/05/2015 08:04  Glucose-Capillary Latest Ref Range: 65-99 mg/dL 148 (H) 162 (H) 188 (H) 169 (H) 159 (H)   Diabetes history: A1C 11% on 09/04/15 Outpatient Diabetes medications: Metformin 500mg  bid Current orders for Inpatient glycemic control: DKA Phase 1   Inpatient Diabetes Program Recommendations: When patient is ready to transition to phase 2 DKA order set, please consider ordering (based on guidelines) Lantus 26 units 2 hours before the drip is discontinued and then qday (0.3units/kg).  Consider Novolog moderate correction 0-15 units tid and Novolog 0-5 units qhs Consider Novolog 4 units tid with meals.  Please order heart healthy/carb modified diet.   Ordered the Living Well with Diabetes book and placed an outpatient referral for diabetes education.   Gentry Fitz, RN, BA, MHA, CDE Diabetes Coordinator Inpatient Diabetes Program  [phone] (Team Pager) [phone] ([place]) 09/05/2015 9:16 AM

## 2015-09-05 NOTE — Progress Notes (Signed)
[facility] NOTE  Pharmacy Consult for Electrolyte Management     No Known Allergies  Patient Measurements: Height: 5\' 7"  (170.2 cm) Weight: 194 lb 7.1 oz (88.2 kg) IBW/kg (Calculated) : 66.1   Vital Signs: Temp: 98.6 F (37 C) (01/12 1738) Temp Source: Oral (01/12 1738) BP: 125/60 mmHg (01/12 2200) Pulse Rate: 76 (01/12 2200) Intake/Output from previous day: 01/11 0701 - 01/12 0700 In: 1958.9 [P.O.:240; I.V.:1718.9] Out: 775 [Urine:775] Intake/Output from this shift: Total I/O In: 100 [P.O.:100] Out: 125 [Urine:125] Vent settings for last 24 hours:    Labs:  Recent Labs  09/04/15 1310 09/04/15 1656 09/04/15 1922 09/05/15 0642  WBC 13.7*  --   --  11.3*  HGB 17.0  --   --  14.6  HCT 51.5  --   --  41.9  PLT 234  --   --  181  CREATININE 1.79* 1.68* 1.49* 1.22  MG  --   --   --  2.0  PHOS  --   --   --  3.3   Estimated Creatinine Clearance: 65.7 mL/min (by C-G formula based on Cr of 1.22).   Recent Labs  09/05/15 2005 09/05/15 2105 09/05/15 2156  GLUCAP 205* 266* 212*    Microbiology: Recent Results (from the past 720 hour(s))  CULTURE, URINE COMPREHENSIVE     Status: None   Collection Time: 08/30/15  4:39 PM  Result Value Ref Range Status   Urine Culture, Comprehensive Final report  Final   Result 1 Comment  Final    Comment: No growth in 36 - 48 hours.  Microscopic Examination     Status: Abnormal   Collection Time: 08/30/15  4:39 PM  Result Value Ref Range Status   WBC, UA 0-5 0 -  5 /hpf Final   RBC, UA 0-2 0 -  2 /hpf Final   Epithelial Cells (non renal) None seen 0 - 10 /hpf Final   Mucus, UA Present (A) Not Estab. Final   Bacteria, UA Few (A) None seen/Few Final  MRSA PCR Screening     Status: None   Collection Time: 09/04/15  4:43 PM  Result Value Ref Range Status   MRSA by PCR NEGATIVE NEGATIVE Final    Comment:        The GeneXpert MRSA Assay (FDA approved for NASAL specimens only), is one component of  a comprehensive MRSA colonization surveillance program. It is not intended to diagnose MRSA infection nor to guide or monitor treatment for MRSA infections.     Medications:  Scheduled:  . finasteride  5 mg Oral Daily  . pneumococcal 23 valent vaccine  0.5 mL Intramuscular Tomorrow-1000  . sodium chloride  3 mL Intravenous Q12H   Infusions:  . dextrose 5 % and 0.45% NaCl 125 mL/hr at 09/05/15 2305  . insulin (NOVOLIN-R) infusion 4 Units/hr (09/05/15 2304)    Assessment: Pharmacy consulted to manage electrolytes for 65 yo male being treated for DKA with hypokalemia.   Plan:  Will order potassium 84mEq PO x 1. All other electrolytes WNL. Will recheck electrolytes with am labs.   Pharmacy will continue to monitor and adjust per consult.    Simpson,Michael L 09/05/2015,11:08 PM

## 2015-09-06 LAB — BASIC METABOLIC PANEL
ANION GAP: 11 (ref 5–15)
BUN: 21 mg/dL — ABNORMAL HIGH (ref 6–20)
CALCIUM: 7.4 mg/dL — AB (ref 8.9–10.3)
CO2: 21 mmol/L — AB (ref 22–32)
Chloride: 100 mmol/L — ABNORMAL LOW (ref 101–111)
Creatinine, Ser: 1.05 mg/dL (ref 0.61–1.24)
Glucose, Bld: 229 mg/dL — ABNORMAL HIGH (ref 65–99)
Potassium: 3.4 mmol/L — ABNORMAL LOW (ref 3.5–5.1)
Sodium: 132 mmol/L — ABNORMAL LOW (ref 135–145)

## 2015-09-06 LAB — GLUCOSE, CAPILLARY
GLUCOSE-CAPILLARY: 145 mg/dL — AB (ref 65–99)
GLUCOSE-CAPILLARY: 231 mg/dL — AB (ref 65–99)
GLUCOSE-CAPILLARY: 89 mg/dL (ref 65–99)
Glucose-Capillary: 180 mg/dL — ABNORMAL HIGH (ref 65–99)
Glucose-Capillary: 149 mg/dL — ABNORMAL HIGH (ref 65–99)
Glucose-Capillary: 267 mg/dL — ABNORMAL HIGH (ref 65–99)
Glucose-Capillary: 325 mg/dL — ABNORMAL HIGH (ref 65–99)

## 2015-09-06 LAB — CBC
HCT: 39.8 % — ABNORMAL LOW (ref 40.0–52.0)
HEMOGLOBIN: 14.1 g/dL (ref 13.0–18.0)
MCH: 32.8 pg (ref 26.0–34.0)
MCHC: 35.4 g/dL (ref 32.0–36.0)
MCV: 92.7 fL (ref 80.0–100.0)
Platelets: 153 10*3/uL (ref 150–440)
RBC: 4.29 MIL/uL — AB (ref 4.40–5.90)
RDW: 12.4 % (ref 11.5–14.5)
WBC: 9.8 10*3/uL (ref 3.8–10.6)

## 2015-09-06 MED ORDER — INSULIN ASPART 100 UNIT/ML ~~LOC~~ SOLN: 4.0000 [IU] | Freq: Three times a day (TID) | SUBCUTANEOUS | Status: DC

## 2015-09-06 MED ORDER — POTASSIUM CHLORIDE 20 MEQ PO PACK
40.0000 meq | PACK | Freq: Once | ORAL | Status: AC
  Administered 2015-09-06: 40 meq via ORAL
  Filled 2015-09-06: qty 2

## 2015-09-06 MED ORDER — INSULIN GLARGINE 100 UNIT/ML ~~LOC~~ SOLN: 30.0000 [IU] | Freq: Every day | SUBCUTANEOUS | Status: DC

## 2015-09-06 MED ORDER — INSULIN ASPART 100 UNIT/ML ~~LOC~~ SOLN
0.0000 [IU] | Freq: Three times a day (TID) | SUBCUTANEOUS | Status: DC
  Administered 2015-09-06: 5 [IU] via SUBCUTANEOUS
  Administered 2015-09-06: 9 [IU] via SUBCUTANEOUS
  Filled 2015-09-06: qty 5
  Filled 2015-09-06: qty 9

## 2015-09-06 MED ORDER — CALCIUM CARBONATE ANTACID 500 MG PO CHEW: 1.0000 | CHEWABLE_TABLET | Freq: Two times a day (BID) | ORAL | Status: AC

## 2015-09-06 MED ORDER — CALCIUM CARBONATE ANTACID 500 MG PO CHEW
1.0000 | CHEWABLE_TABLET | Freq: Two times a day (BID) | ORAL | Status: DC
  Filled 2015-09-06: qty 1

## 2015-09-06 MED ORDER — INSULIN ASPART 100 UNIT/ML ~~LOC~~ SOLN: 0.0000 [IU] | Freq: Every day | SUBCUTANEOUS | Status: DC

## 2015-09-06 MED ORDER — LIVING WELL WITH DIABETES BOOK
Freq: Once | Status: AC
  Administered 2015-09-06: 1
  Filled 2015-09-06: qty 1

## 2015-09-06 MED ORDER — PNEUMOCOCCAL VAC POLYVALENT 25 MCG/0.5ML IJ INJ
0.5000 mL | INJECTION | INTRAMUSCULAR | Status: DC
Start: 2015-09-06 — End: 2016-07-28

## 2015-09-06 MED ORDER — INSULIN GLARGINE 100 UNIT/ML ~~LOC~~ SOLN: 26.0000 [IU] | Freq: Every day | SUBCUTANEOUS | Status: DC

## 2015-09-06 MED ORDER — INSULIN GLARGINE 100 UNIT/ML ~~LOC~~ SOLN
26.0000 [IU] | Freq: Every day | SUBCUTANEOUS | Status: DC
  Filled 2015-09-06: qty 0.26

## 2015-09-06 MED ORDER — INSULIN GLARGINE 100 UNIT/ML ~~LOC~~ SOLN
26.0000 [IU] | Freq: Every day | SUBCUTANEOUS | Status: DC
  Administered 2015-09-06: 26 [IU] via SUBCUTANEOUS
  Filled 2015-09-06: qty 0.26

## 2015-09-06 NOTE — Discharge Summary (Signed)
[facility] [place] NAME: Sheriff Amesquita    MR#:  [id number]  DATE OF BIRTH:  06/07/1951  DATE OF ADMISSION:  09/04/2015 ADMITTING PHYSICIAN: Henreitta Leber, MD  DATE OF DISCHARGE: 09/06/2015 PRIMARY CARE PHYSICIAN: Rubbie Battiest, NP    ADMISSION DIAGNOSIS:  Diabetic ketoacidosis without coma associated with type 2 diabetes mellitus ([place]) [E13.10]  DISCHARGE DIAGNOSIS:  Active Problems:   DKA (diabetic ketoacidoses) ([place])   SECONDARY DIAGNOSIS:   Past Medical History  Diagnosis Date  . Diabetes mellitus without complication ([place])   . Hypertension   . COPD (chronic obstructive pulmonary disease) ([place])   . Sleep apnea     on CPAP  . Enlarged prostate   . HTN (hypertension) 03/01/2013  . Essential (primary) hypertension 03/01/2013    Overview:  Last Assessment & Plan:  Taking meds as prescribed. Check bmet. Continue to follow.   . Acute bacterial sinusitis 08/22/2014    HOSPITAL COURSE:  65 year old male with past medical history of diabetes, hypertension, BPH, presents to the hospital due to fatigue, increasing thirst, increasing urinary frequency and noted to be in acute diabetic ketoacidosis.  #1 acute DKA-with uncontrolled diabetes mellitus secondary to noncompliance with the diet  -Hemoglobin A1c at 11 -This is the cause of patient's polyphagia and polyuria -Received  IV fluids, insulin drip per protocol c -  diabetic diet / insulin teaching given - started the patient and 26 units of subcutaneous Lantus and d/c home with 30 units of lantus  #2 acute renal failure-secondary to her hyperglycemia and acute DKA. Creatinine 1.79-1.49-1.2 -- 1.05 -Clinically improved  with IV fluids.  -Held  hydrochlorothiazide  #3 BPH-continue finasteride.  #4 hyponatremia-pseudohyponatremia secondary to the hyperglycemia. -Improved  #5 leukocytosis-likely stress mediated from the acute DKA.-No evidence of acute infection. Follow  white cell count. 13.7-11.3       DISCHARGE CONDITIONS:   fair  CONSULTS OBTAINED:  Treatment Team:  Nicholes Mango, MD   PROCEDURES none  DRUG ALLERGIES:  No Known Allergies  DISCHARGE MEDICATIONS:   Current Discharge Medication List    START taking these medications   Details  calcium carbonate (TUMS - DOSED IN MG ELEMENTAL CALCIUM) 500 MG chewable tablet Chew 1 tablet (200 mg of elemental calcium total) by mouth 2 (two) times daily.    insulin aspart (NOVOLOG) 100 UNIT/ML injection Inject 4 Units into the skin 3 (three) times daily before meals. Qty: 10 mL, Refills: 3    insulin glargine (LANTUS) 100 UNIT/ML injection Inject 0.3 mLs (30 Units total) into the skin daily. Qty: 10 mL, Refills: 3    pneumococcal 23 valent vaccine (PNU-IMMUNE) 25 MCG/0.5ML injection Inject 0.5 mLs into the muscle tomorrow at 10 am. Qty: 2.5 mL, Refills: 0      CONTINUE these medications which have NOT CHANGED   Details  finasteride (PROSCAR) 5 MG tablet Take 5 mg by mouth daily.     fluticasone (FLONASE) 50 MCG/ACT nasal spray Place 2 sprays into both nostrils daily. Qty: 16 g, Refills: 6   Associated Diagnoses: URI (upper respiratory infection)    lisinopril-hydrochlorothiazide (PRINZIDE,ZESTORETIC) 20-12.5 MG per tablet Take 1 tablet by mouth daily. Qty: 90 tablet, Refills: 3    metFORMIN (GLUCOPHAGE) 1000 MG tablet Take 1 tablet (1,000 mg total) by mouth 2 (two) times daily with a meal. Qty: 60 tablet, Refills: 0    Multiple Vitamin (MULTIVITAMIN WITH MINERALS) TABS tablet Take 1 tablet by mouth daily.  tadalafil (CIALIS) 5 MG tablet Take 5 mg by mouth daily.         DISCHARGE INSTRUCTIONS:   Activity as tolerated Diet diabetic Follow-up with outpatient diabetic clinic in a week Follow-up with primary care physician in a week Follow-up with endocrinology Dr. Gabriel Carina in a week   DIET:  Diabetic diet  DISCHARGE CONDITION:  Fair  ACTIVITY:  Activity as  tolerated  OXYGEN:  Home Oxygen: No.   Oxygen Delivery: room air  DISCHARGE LOCATION:  home   If you experience worsening of your admission symptoms, develop shortness of breath, life threatening emergency, suicidal or homicidal thoughts you must seek medical attention immediately by calling 911 or calling your MD immediately  if symptoms less severe.  You Must read complete instructions/literature along with all the possible adverse reactions/side effects for all the Medicines you take and that have been prescribed to you. Take any new Medicines after you have completely understood and accpet all the possible adverse reactions/side effects.   Please note  You were cared for by a hospitalist during your hospital stay. If you have any questions about your discharge medications or the care you received while you were in the hospital after you are discharged, you can call the unit and asked to speak with the hospitalist on call if the hospitalist that took care of you is not available. Once you are discharged, your primary care physician will handle any further medical issues. Please note that NO REFILLS for any discharge medications will be authorized once you are discharged, as it is imperative that you return to your primary care physician (or establish a relationship with a primary care physician if you do not have one) for your aftercare needs so that they can reassess your need for medications and monitor your lab values.     Today  Chief Complaint  Patient presents with  . Fatigue   Pt is feeling fine with no ON events  ROS:  CONSTITUTIONAL: Denies fevers, chills. Denies any fatigue, weakness.  EYES: Denies blurry vision, double vision, eye pain. EARS, NOSE, THROAT: Denies tinnitus, ear pain, hearing loss. RESPIRATORY: Denies cough, wheeze, shortness of breath.  CARDIOVASCULAR: Denies chest pain, palpitations, edema.  GASTROINTESTINAL: Denies nausea, vomiting, diarrhea,  abdominal pain. Denies bright red blood per rectum. GENITOURINARY: Denies dysuria, hematuria. ENDOCRINE: Denies nocturia or thyroid problems. HEMATOLOGIC AND LYMPHATIC: Denies easy bruising or bleeding. SKIN: Denies rash or lesion. MUSCULOSKELETAL: Denies pain in neck, back, shoulder, knees, hips or arthritic symptoms.  NEUROLOGIC: Denies paralysis, paresthesias.  PSYCHIATRIC: Denies anxiety or depressive symptoms.   VITAL SIGNS:  Blood pressure 141/88, pulse 118, temperature 98 F (36.7 C), temperature source Oral, resp. rate 14, height 5\' 7"  (1.702 m), weight 88.2 kg (194 lb 7.1 oz), SpO2 99 %.  I/O:   Intake/Output Summary (Last 24 hours) at 09/06/15 1314 Last data filed at 09/06/15 1053  Gross per 24 hour  Intake    343 ml  Output   1050 ml  Net   -707 ml    PHYSICAL EXAMINATION:  GENERAL:  65 y.o.-year-old patient lying in the bed with no acute distress.  EYES: Pupils equal, round, reactive to light and accommodation. No scleral icterus. Extraocular muscles intact.  HEENT: Head atraumatic, normocephalic. Oropharynx and nasopharynx clear.  NECK:  Supple, no jugular venous distention. No thyroid enlargement, no tenderness.  LUNGS: Normal breath sounds bilaterally, no wheezing, rales,rhonchi or crepitation. No use of accessory muscles of respiration.  CARDIOVASCULAR: S1, S2 normal. No  murmurs, rubs, or gallops.  ABDOMEN: Soft, non-tender, non-distended. Bowel sounds present. No organomegaly or mass.  EXTREMITIES: No pedal edema, cyanosis, or clubbing.  NEUROLOGIC: Cranial nerves II through XII are intact. Muscle strength 5/5 in all extremities. Sensation intact. Gait not checked.  PSYCHIATRIC: The patient is alert and oriented x 3.  SKIN: No obvious rash, lesion, or ulcer.   DATA REVIEW:   CBC  Recent Labs Lab 09/06/15 0414  WBC 9.8  HGB 14.1  HCT 39.8*  PLT 153    Chemistries   Recent Labs Lab 09/05/15 0642 09/06/15 0414  NA 138 132*  K 3.4* 3.4*  CL 106  100*  CO2 26 21*  GLUCOSE 207* 229*  BUN 33* 21*  CREATININE 1.22 1.05  CALCIUM 8.5* 7.4*  MG 2.0  --     Cardiac Enzymes  Recent Labs Lab 09/04/15 1310  TROPONINI <0.03    Microbiology Results  Results for orders placed or performed during the hospital encounter of 09/04/15  MRSA PCR Screening     Status: None   Collection Time: 09/04/15  4:43 PM  Result Value Ref Range Status   MRSA by PCR NEGATIVE NEGATIVE Final    Comment:        The GeneXpert MRSA Assay (FDA approved for NASAL specimens only), is one component of a comprehensive MRSA colonization surveillance program. It is not intended to diagnose MRSA infection nor to guide or monitor treatment for MRSA infections.     RADIOLOGY:  Dg Chest 2 View  09/04/2015  CLINICAL DATA:  Bronchitis. Chest discomfort and heaviness. Hypertension. EXAM: CHEST  2 VIEW COMPARISON:  None. FINDINGS: The heart size and mediastinal contours are within normal limits. Both lungs are clear. The visualized skeletal structures are unremarkable. IMPRESSION: No active cardiopulmonary disease. Electronically Signed   By: Earle Gell M.D.   On: 09/04/2015 14:19    EKG:   Orders placed or performed during the hospital encounter of 09/04/15  . ED EKG  . ED EKG  . EKG 12-Lead  . EKG 12-Lead      Management plans discussed with the patient, family and they are in agreement.  CODE STATUS:     Code Status Orders        Start     Ordered   09/04/15 1630  Full code   Continuous     09/04/15 1629    Code Status History    Date Active Date Inactive Code Status Order ID Comments User Context   09/04/2015  3:31 PM 09/04/2015  4:30 PM Full Code [id number]  Henreitta Leber, MD ED      TOTAL TIME TAKING CARE OF THIS PATIENT: 45 minutes.    @[facility] @  on 09/06/2015 at 1:14 PM  Between 7am to 6pm - Pager - [phone]  After 6pm go to [email or web address] - password [email or web address] [facility]  Office   [phone]  CC: Primary care physician; Rubbie Battiest, NP

## 2015-09-06 NOTE — Discharge Instructions (Signed)
Activity as tolerated Diet diabetic Follow-up with outpatient diabetic clinic in a week Follow-up with primary care physician in a week Follow-up with endocrinology Dr. Gabriel Carina in a week

## 2015-09-06 NOTE — Progress Notes (Signed)
Inpatient Diabetes Program Recommendations  AACE/ADA: New Consensus Statement on Inpatient Glycemic Control (2015)  Target Ranges:  Prepandial:   less than 140 mg/dL      Peak postprandial:   less than 180 mg/dL (1-2 hours)      Critically ill patients:  140 - 180 mg/dL   Review of Glycemic Control  Results for Luke Coleman, Luke Coleman (MRN [id number]) as of 09/06/2015 10:07  Ref. Range 09/06/2015 00:56 09/06/2015 01:58 09/06/2015 02:59 09/06/2015 04:13 09/06/2015 07:56  Glucose-Capillary Latest Ref Range: 65-99 mg/dL 149 (H) 145 (H) 180 (H) 231 (H) 267 (H)    Diabetes history: A1C 11% on 09/04/15 Outpatient Diabetes medications: Metformin 500mg  bid Current orders for Inpatient glycemic control: Lantus 26 units qday, Novolog 0-9 units tid, Novolog 0-5 units qhs  Inpatient Diabetes Program Recommendations:  Consider Novolog moderate correction 0-15 units tid and Novolog 0-5 units qhs Consider Novolog 4 units tid with meals.  Please order heart healthy/carb modified diet.   Gentry Fitz, RN, BA, MHA, CDE Diabetes Coordinator Inpatient Diabetes Program  [phone] (Team Pager) [phone] ([place]) 09/06/2015 10:07 AM

## 2015-09-09 ENCOUNTER — Telehealth: Payer: Self-pay

## 2015-09-09 NOTE — Telephone Encounter (Signed)
Pharmacy called and the pt's insurance is asking to use an alternative to the Lantus Rx. Please advise/tvw

## 2015-09-10 ENCOUNTER — Encounter: Payer: Self-pay | Admitting: Family Medicine

## 2015-09-10 ENCOUNTER — Other Ambulatory Visit: Payer: Self-pay | Admitting: Nurse Practitioner

## 2015-09-10 ENCOUNTER — Telehealth: Payer: Self-pay

## 2015-09-10 ENCOUNTER — Ambulatory Visit (INDEPENDENT_AMBULATORY_CARE_PROVIDER_SITE_OTHER): Payer: BC Managed Care – PPO | Admitting: Family Medicine

## 2015-09-10 DIAGNOSIS — J320 Chronic maxillary sinusitis: Secondary | ICD-10-CM | POA: Diagnosis not present

## 2015-09-10 MED ORDER — INSULIN DETEMIR 100 UNIT/ML ~~LOC~~ SOLN: 30.0000 [IU] | Freq: Every day | SUBCUTANEOUS | Status: DC

## 2015-09-10 NOTE — Progress Notes (Signed)
Subjective:  Patient ID: Luke Coleman, male    DOB: 1951-05-25  Age: 65 y.o. MRN: [id number]  CC: ? Sinus infection  HPI:  65 year old male with a past medical history of uncontrolled diabetes and chronic sinusitis presents with concerns that he may have a sinus infection.  Patient reports that he's had significant sinus congestion and discolored mucus when he blows his nose. He states that this is been going on since November. He has been treated for sinus infection recently (08/28/15). He states that he's had no improvement. He's continued to use Flonase and nasal rinse. No known exacerbating factors. No associated fevers or chills. Patient requesting antibiotics today.  Social Hx   Social History   Social History  . Marital Status: Married    Spouse Name: Luke Coleman  . Number of Children: 1  . Years of Education: 68   Occupational History  . Scientist, physiological of Continuing Education     [employer]   Social History Main Topics  . Smoking status: Never Smoker   . Smokeless tobacco: Never Used  . Alcohol Use: Yes     Comment: Occasional  . Drug Use: No  . Sexual Activity:    Partners: Female   Other Topics Concern  . None   Social History Narrative   Luke Coleman grew up in [place], [place], [place]. He currently is living in [place], [place] with his wife of 2 months. He was married previously to his second wife for 29 years. Previous to that, he was married to his first wife for 7 years and has 1 daughter from this marriage (37 y/o). He widowed in 2013 (second wife). 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. He obtained a Oceanographer in Scientist, physiological, [place] from [employer]. 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 He enjoys baseball. Enjoys the outdoors. He also enjoys watching old movies. 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     Review of Systems  Constitutional: Negative for fever and chills.  HENT: Positive for congestion and sinus pressure.    Objective:  BP 136/74 mmHg  Pulse 96  Temp(Src) 98.4 F (36.9 C) (Oral)  Ht 5\' 7"  (1.702 m)  Wt 208 lb (94.348 kg)  BMI 32.57 kg/m2  SpO2 96%  BP/Weight 09/10/2015 09/06/2015 [id number]  Systolic BP [id number] -  Diastolic BP 74 88 -  Wt. (Lbs) 208 - 194.45  BMI 32.57 - 30.45   Physical Exam  Constitutional: He appears well-developed. No distress.  HENT:  Head: Normocephalic and atraumatic.  Oropharynx clear. TMs without erythema. Severe right maxillary sinus tenderness to palpation.   Neck: Neck supple.  Cardiovascular: Regular rhythm.  Tachycardia present.   Pulmonary/Chest: Effort normal.  Neurological: He is alert.  Psychiatric: He has a normal mood and affect.  Vitals reviewed.  Lab Results  Component Value Date   WBC 9.8 09/06/2015   HGB 14.1 09/06/2015   HCT 39.8* 09/06/2015   PLT 153 09/06/2015   GLUCOSE 229* 09/06/2015   NA 132* 09/06/2015   K 3.4* 09/06/2015   CL 100* 09/06/2015   CREATININE 1.05 09/06/2015   BUN 21* 09/06/2015   CO2 21* 09/06/2015   HGBA1C 11.0* 09/04/2015   MICROALBUR 2.7* 03/29/2015    Assessment & Plan:   Problem List Items Addressed This Visit    Sinusitis, maxillary, chronic  Patient with a history of chronic sinusitis. He reports recent worsening. Given recent treatment, I doubt this is bacterial in origin. Given persistence of symptoms, sending to ENT for further evaluation. Advise continue Flonase and use of the nasal sinus rinse.      Relevant Orders   Ambulatory referral to ENT     Follow-up: PRN  [place]

## 2015-09-10 NOTE — Telephone Encounter (Signed)
Called pt with Luke Coleman message./tvw

## 2015-09-10 NOTE — Patient Instructions (Signed)
I'm going to hold off on antibiotics given recent antibiotic treatment a few weeks ago.  I am sending you to ENT for evaluation.  Take care  Dr. Lacinda Axon

## 2015-09-10 NOTE — Telephone Encounter (Signed)
I have sent Levemir- the alternative to Lantus to his pharmacy> please call the pharmacy and see that this is has gone through and is a cheaper option for Mr. Luke Coleman. Thank you!

## 2015-09-10 NOTE — Assessment & Plan Note (Signed)
Patient with a history of chronic sinusitis. He reports recent worsening. Given recent treatment, I doubt this is bacterial in origin. Given persistence of symptoms, sending to ENT for further evaluation. Advise continue Flonase and use of the nasal sinus rinse.

## 2015-09-10 NOTE — Progress Notes (Signed)
Pre visit review using our clinic review tool, if applicable. No additional management support is needed unless otherwise documented below in the visit note. 

## 2015-09-12 ENCOUNTER — Ambulatory Visit: Payer: BC Managed Care – PPO | Admitting: Nurse Practitioner

## 2015-09-24 ENCOUNTER — Encounter: Payer: BC Managed Care – PPO | Attending: Family Medicine | Admitting: Dietician

## 2015-09-24 ENCOUNTER — Encounter: Payer: Self-pay | Admitting: Dietician

## 2015-09-24 VITALS — BP 134/78 | Ht 67.0 in | Wt 210.6 lb

## 2015-09-24 DIAGNOSIS — E119 Type 2 diabetes mellitus without complications: Secondary | ICD-10-CM

## 2015-09-24 NOTE — Progress Notes (Signed)
Diabetes Self-Management Education  Visit Type: First/Initial  Appt. Start Time: 1530 Appt. End Time: 1630  09/24/2015  Mr. Luke Coleman, identified by name and date of birth, is a 65 y.o. male with a diagnosis of Diabetes: Type 2.   ASSESSMENT  Blood pressure 134/78, height 5\' 7"  (1.702 m), weight 210 lb 9.6 oz (95.528 kg). Body mass index is 32.98 kg/(m^2).  Overweight Lacks knowledge of diabetes care and diet      Diabetes Self-Management Education - 09/24/15 1718    Visit Information   Visit Type First/Initial   Initial Visit   Diabetes Type Type 2   Health Coping   How would you rate your overall health? Good   Psychosocial Assessment   Patient Belief/Attitude about Diabetes Motivated to manage diabetes   Self-care barriers None   Other persons present Spouse/SO   Patient Concerns Weight Control;Glycemic Control;Medication  prevent complications   Special Needs None   Preferred Learning Style Hands on   Learning Readiness Ready   What is the last grade level you completed in school? masters degree   Complications   Last HgB A1C per patient/outside source 11 %  08-2015   How often do you check your blood sugar? --  3x/day   Fasting Blood glucose range (mg/dL) --  120's-150's   Have you had a dilated eye exam in the past 12 months? No   Have you had a dental exam in the past 12 months? No   Are you checking your feet? No   Dietary Intake   Breakfast --  eats 3 meals/day   Lunch --  eats fried foods and sweets/desserts 2-3x/wk.; eats lunch out 5x/wk.   Snack (evening) --  eats occasional bedtime snack-fruit, popcorn   Beverage(s) --  drinks 4-5 waters/dayand 4-5 unsweetened beverages/diet sodas   Exercise   Exercise Type ADL's  no regular exercise   Patient Education   Previous Diabetes Education No   Disease state  --  definition of type 2 diabetes and treatment options   Nutrition management  Role of diet in the treatment of diabetes and the  relationship between the three main macronutrients and blood glucose level;Food label reading, portion sizes and measuring food.;Carbohydrate counting   Physical activity and exercise  Role of exercise on diabetes management, blood pressure control and cardiac health.   Medications Taught/reviewed insulin injection, site rotation, insulin storage and needle disposal.;Reviewed patients medication for diabetes, action, purpose, timing of dose and side effects.  reviewed use of syringes and insulin pens   Monitoring Purpose and frequency of SMBG.;Taught/evaluated SMBG meter.;Identified appropriate SMBG and/or A1C goals.;Yearly dilated eye exam;Taught/discussed recording of test results and interpretation of SMBG.   Acute complications Taught treatment of hypoglycemia - the 15 rule.;Discussed and identified patients' treatment of hyperglycemia.   Chronic complications Dental care;Retinopathy and reason for yearly dilated eye exams;Relationship between chronic complications and blood glucose control   Psychosocial adjustment Role of stress on diabetes   Personal strategies to promote health Helped patient develop diabetes management plan for (enter comment);Lifestyle issues that need to be addressed for better diabetes care      Individualized Plan for Diabetes Self-Management Training:   Learning Objective:  Patient will have a greater understanding of diabetes self-management. Patient education plan is to attend individual and/or group sessions per assessed needs and concerns.   Plan:   Patient Instructions    Check blood sugars 4 x day before each meal and before bed every day  Exercise:  Try walking on treadmill for    15  minutes   3 days a week  Avoid sugar sweetened drinks (soda, tea, coffee, sports drinks, juices)  Eat 3 meals day,   1  snacks a day at bedtime  Space meals 4-5 hours apart  Make dentist / eye doctor appointment  Bring blood sugar records to the next  appointment/class  Call your doctor for a prescription for:  1. Meter strips (type)  Ultra One Touch test strips  checking  4 times per day  2. Lancets (type)  One Touch Delica Lanceets  checking  4   times per day  Get a Haematologist fast acting glucose and a snack at all times  Rotate injection sites [place] alert ID at all times  Return for appointment/classes on:   Class 1                10-14-15 Class 2                10-21-15 Class 3                10-28-15   Expected Outcomes:   positive  Education material provided: General Meal Planning Guidelines, High/Low blood sugar handout, Ultra One Touch 2 meter  If problems or questions, patient to contact team via:  [phone]  Future DSME appointment:  10-14-15

## 2015-09-24 NOTE — Patient Instructions (Signed)
   Check blood sugars 4 x day before each meal and before bed every day  Exercise:  Try walking on treadmill for    15  minutes   3 days a week  Avoid sugar sweetened drinks (soda, tea, coffee, sports drinks, juices)  Eat 3 meals day,   1  snacks a day at bedtime  Space meals 4-6 hours apart  Make dentist / eye doctor appointment  Bring blood sugar records to the next appointment/class  Call your doctor for a prescription for:  1. Meter strips (type)  Ultra One Touch test strips  checking  4 times per day  2. Lancets (type)  One Touch Delica Lanceets  checking  4   times per day  Get a Sharps container  Carry fast acting glucose and a snack at all times  Rotate injection sites  Return for appointment/classes on:   Class 1                10-14-15 Class 2                10-21-15 Class 3                10-28-15

## 2015-10-04 ENCOUNTER — Ambulatory Visit: Payer: BC Managed Care – PPO | Admitting: Urology

## 2015-10-14 ENCOUNTER — Ambulatory Visit: Payer: BC Managed Care – PPO

## 2015-10-14 ENCOUNTER — Telehealth: Payer: Self-pay | Admitting: Dietician

## 2015-10-14 NOTE — Telephone Encounter (Signed)
Patient left a voicemail message to cancel his attendance at classes. He stated his blood sugars are in control and he does not need to complete the program.

## 2015-10-17 ENCOUNTER — Telehealth: Payer: Self-pay | Admitting: Radiology

## 2015-10-17 MED ORDER — TADALAFIL 5 MG PO TABS: 5.0000 mg | ORAL_TABLET | Freq: Every day | ORAL | Status: DC

## 2015-10-17 NOTE — Telephone Encounter (Signed)
Pt notified of script sent to pharmacy. Pt voices understanding. 

## 2015-10-17 NOTE — Telephone Encounter (Signed)
Script sent to pharmacy.  Please let him know.    [name], MD

## 2015-10-17 NOTE — Telephone Encounter (Signed)
Pt states his insurance has now approved for him to take Cialis 1 tablet daily for his prostate if it is ordered 90 days at a time. He asks for a prescription. Please advise.

## 2015-10-21 ENCOUNTER — Ambulatory Visit: Payer: BC Managed Care – PPO

## 2015-10-21 ENCOUNTER — Encounter: Payer: Self-pay | Admitting: Dietician

## 2015-10-21 NOTE — Progress Notes (Signed)
Pt called on 10-14-15-reports BG's are doing well and he does not want to reschedule diabetes classes-will send discharge letter to MD

## 2015-10-28 ENCOUNTER — Ambulatory Visit: Payer: BC Managed Care – PPO

## 2015-11-14 ENCOUNTER — Telehealth: Payer: Self-pay | Admitting: Nurse Practitioner

## 2015-11-14 MED ORDER — LISINOPRIL-HYDROCHLOROTHIAZIDE 20-12.5 MG PO TABS: 1.0000 | ORAL_TABLET | Freq: Every day | ORAL | Status: DC

## 2015-11-14 NOTE — Telephone Encounter (Signed)
Maggie from [facility] in [place], phone  [phone] would like to have ok to refill pt's lisinopril-hydrochlorothiazide (PRINZIDE,ZESTORETIC) 20-12.5 MG per tablet. Please fax this to [phone].

## 2015-11-14 NOTE — Telephone Encounter (Signed)
Refill given

## 2015-12-11 ENCOUNTER — Other Ambulatory Visit: Payer: Self-pay

## 2015-12-11 DIAGNOSIS — N400 Enlarged prostate without lower urinary tract symptoms: Secondary | ICD-10-CM

## 2015-12-11 MED ORDER — FINASTERIDE 5 MG PO TABS: 5.0000 mg | ORAL_TABLET | Freq: Every day | ORAL | Status: DC

## 2016-01-17 ENCOUNTER — Ambulatory Visit: Payer: BC Managed Care – PPO | Admitting: Urology

## 2016-05-15 ENCOUNTER — Ambulatory Visit (INDEPENDENT_AMBULATORY_CARE_PROVIDER_SITE_OTHER): Payer: BC Managed Care – PPO | Admitting: Family Medicine

## 2016-05-15 ENCOUNTER — Encounter: Payer: Self-pay | Admitting: Family Medicine

## 2016-05-15 VITALS — BP 130/72 | HR 87 | Temp 98.1°F | Wt 209.1 lb

## 2016-05-15 DIAGNOSIS — R05 Cough: Secondary | ICD-10-CM | POA: Diagnosis not present

## 2016-05-15 DIAGNOSIS — H9203 Otalgia, bilateral: Secondary | ICD-10-CM | POA: Diagnosis not present

## 2016-05-15 DIAGNOSIS — R0981 Nasal congestion: Secondary | ICD-10-CM

## 2016-05-15 DIAGNOSIS — J069 Acute upper respiratory infection, unspecified: Secondary | ICD-10-CM | POA: Diagnosis not present

## 2016-05-15 DIAGNOSIS — R059 Cough, unspecified: Secondary | ICD-10-CM

## 2016-05-15 MED ORDER — AMOXICILLIN-POT CLAVULANATE 875-125 MG PO TABS: 1.0000 | ORAL_TABLET | Freq: Two times a day (BID) | ORAL | 0 refills | Status: DC

## 2016-05-15 MED ORDER — HYDROCODONE-HOMATROPINE 5-1.5 MG/5ML PO SYRP: 5.0000 mL | ORAL_SOLUTION | Freq: Four times a day (QID) | ORAL | 0 refills | Status: DC | PRN

## 2016-05-15 NOTE — Progress Notes (Signed)
Subjective:    Patient ID: Luke Coleman, male    DOB: 07-25-1951, 65 y.o.   MRN: [id number]  HPI This is a 65 yo male who presents today with 4 days of nasal congestion, cough productive of thick green mucus that seems to be coming from his throat. No SOB, no wheezing. Ears feel congested, pain over eyes. Hasn't slept for last two nights. Recently restarted flonase. Subjective fever.   DM type 2, blood sugars running 118- 124. Is on new daily injection (he is unsure of the name) for his diabetes.   Past Medical History:  Diagnosis Date  . Acute bacterial sinusitis 08/22/2014  . COPD (chronic obstructive pulmonary disease) ([place])   . Diabetes mellitus without complication ([place])   . Enlarged prostate   . Essential (primary) hypertension 03/01/2013   Overview:  Last Assessment & Plan:  Taking meds as prescribed. Check bmet. Continue to follow.   Marland Kitchen HTN (hypertension) 03/01/2013  . Hypertension   . Sleep apnea    on CPAP   Past Surgical History:  Procedure Laterality Date  . APPENDECTOMY    . CIRCUMCISION  Age 66  . HERNIA REPAIR    . NASAL SEPTUM SURGERY     For deviated septum  . SMALL INTESTINE SURGERY     Adhesions   Family History  Problem Relation Age of Onset  . Early death Mother     MVA - died age 13  . Alcohol abuse Father   . Hypertension Father   . Cancer Father 66    renal cell carcinoma  . Heart disease Father 40    MI - died  . Diabetes Brother   . Diabetes Maternal Uncle   . Diabetes Paternal Aunt   . Kidney disease Maternal Grandmother    Social History  Substance Use Topics  . Smoking status: Never Smoker  . Smokeless tobacco: Never Used  . Alcohol use No     Comment: Occasional      Review of Systems Per HPI    Objective:   Physical Exam  Constitutional: He appears well-developed and well-nourished. No distress.  HENT:  Head: Normocephalic and atraumatic.  Right Ear: External ear normal. A middle ear effusion is present.  Left Ear:  External ear and ear canal normal. Tympanic membrane is retracted.  Nose: Mucosal edema and rhinorrhea present.  Mouth/Throat: Uvula is midline. Posterior oropharyngeal erythema present. No oropharyngeal exudate or posterior oropharyngeal edema.  Cardiovascular: Normal rate, regular rhythm and normal heart sounds.   Pulmonary/Chest: Effort normal and breath sounds normal.  Frequent dry cough.   Musculoskeletal: Normal range of motion.  Neurological: He is alert.  Skin: Skin is warm and dry. He is not diaphoretic.  Psychiatric: He has a normal mood and affect. His behavior is normal. Judgment and thought content normal.  Vitals reviewed.     BP 130/72   Pulse 87   Temp 98.1 F (36.7 C)   Wt 209 lb 1.9 oz (94.9 kg)   SpO2 97%   BMI 32.75 kg/m  Wt Readings from Last 3 Encounters:  05/15/16 209 lb 1.9 oz (94.9 kg)  09/24/15 210 lb 9.6 oz (95.5 kg)  09/10/15 208 lb (94.3 kg)       Assessment & Plan:  1. Cough - HYDROcodone-homatropine (HYCODAN) 5-1.5 MG/5ML syrup; Take 5 mLs by mouth every 6 (six) hours as needed for cough.  Dispense: 120 mL; Refill: 0  2. Otalgia of both ears - OTC analgesics, decongestant, continue flonase  3. Nasal congestion - see #2  4. Acute upper respiratory infection - suspect viral illness, provided with wait and see antibiotic that he can take if not better in 3-4 days - RTC precautions reviwed - amoxicillin-clavulanate (AUGMENTIN) 875-125 MG tablet; Take 1 tablet by mouth 2 (two) times daily.  Dispense: 14 tablet; Refill: 0   Clarene Reamer, FNP-BC  [facility]  05/15/2016 4:03 PM

## 2016-05-15 NOTE — Patient Instructions (Signed)
For nasal congestion you can use Afrin nasal spray for 3 days max, Sudafed, saline nasal spray (generic is fine for all). Can add an over the counter antihistamine like Zyrtec, Claritin (generic is fine)  For cough you can try Delsym. Drink enough fluids to make your urine light yellow. For fever/chill/muscle aches you can take over the counter acetaminophen or ibuprofen.  Please come back in if you are not better in 5-7 days or if you develop wheezing, shortness of breath or persistent vomiting.  If not better, can start antibiotic in 3-4 days

## 2016-07-06 ENCOUNTER — Telehealth: Payer: Self-pay | Admitting: Nurse Practitioner

## 2016-07-06 NOTE — Telephone Encounter (Signed)
Okay to refill? 

## 2016-07-06 NOTE — Telephone Encounter (Signed)
This patient saw you last year, are you willing to refill, they have seen the NP at [facility] only since.  Thanks

## 2016-07-06 NOTE — Telephone Encounter (Signed)
Pharmacy called looking for a refill request for fluticasone (FLONASE) 50 MCG/ACT nasal spray.   [place], [place].

## 2016-07-07 MED ORDER — FLUTICASONE PROPIONATE 50 MCG/ACT NA SUSP: 2.0000 | Freq: Every day | NASAL | 6 refills | Status: DC

## 2016-07-07 NOTE — Telephone Encounter (Signed)
Rx refilled.

## 2016-07-28 ENCOUNTER — Ambulatory Visit (INDEPENDENT_AMBULATORY_CARE_PROVIDER_SITE_OTHER): Payer: BC Managed Care – PPO | Admitting: Family

## 2016-07-28 ENCOUNTER — Encounter: Payer: Self-pay | Admitting: Family

## 2016-07-28 VITALS — BP 146/68 | HR 75 | Temp 97.6°F | Ht 67.0 in | Wt 209.2 lb

## 2016-07-28 DIAGNOSIS — R300 Dysuria: Secondary | ICD-10-CM

## 2016-07-28 DIAGNOSIS — N5082 Scrotal pain: Secondary | ICD-10-CM | POA: Diagnosis not present

## 2016-07-28 LAB — COMPREHENSIVE METABOLIC PANEL
ALBUMIN: 4.6 g/dL (ref 3.5–5.2)
ALK PHOS: 44 U/L (ref 39–117)
ALT: 20 U/L (ref 0–53)
AST: 16 U/L (ref 0–37)
BUN: 24 mg/dL — AB (ref 6–23)
CALCIUM: 9.5 mg/dL (ref 8.4–10.5)
CHLORIDE: 103 meq/L (ref 96–112)
CO2: 29 mEq/L (ref 19–32)
CREATININE: 1.2 mg/dL (ref 0.40–1.50)
GFR: 64.61 mL/min (ref >60.00)
Glucose, Bld: 102 mg/dL — ABNORMAL HIGH (ref 70–99)
POTASSIUM: 4.6 meq/L (ref 3.5–5.1)
Sodium: 140 mEq/L (ref 135–145)
TOTAL PROTEIN: 7.1 g/dL (ref 6.0–8.3)
Total Bilirubin: 0.6 mg/dL (ref 0.2–1.2)

## 2016-07-28 LAB — CBC WITH DIFFERENTIAL/PLATELET
BASOS PCT: 0.5 % (ref 0.0–3.0)
Basophils Absolute: 0 10*3/uL (ref 0.0–0.1)
EOS ABS: 0.2 10*3/uL (ref 0.0–0.7)
Eosinophils Relative: 2.1 % (ref 0.0–5.0)
HEMATOCRIT: 46.2 % (ref 39.0–52.0)
HEMOGLOBIN: 16 g/dL (ref 13.0–17.0)
LYMPHS PCT: 24.9 % (ref 12.0–46.0)
Lymphs Abs: 2 10*3/uL (ref 0.7–4.0)
MCHC: 34.7 g/dL (ref 30.0–36.0)
MCV: 93.2 fl (ref 78.0–100.0)
MONO ABS: 0.5 10*3/uL (ref 0.1–1.0)
Monocytes Relative: 6.3 % (ref 3.0–12.0)
Neutro Abs: 5.4 10*3/uL (ref 1.4–7.7)
Neutrophils Relative %: 66.2 % (ref 43.0–77.0)
Platelets: 178 10*3/uL (ref 150.0–400.0)
RBC: 4.96 Mil/uL (ref 4.22–5.81)
RDW: 13.1 % (ref 11.5–15.5)
WBC: 8.1 10*3/uL (ref 4.0–10.5)

## 2016-07-28 LAB — POCT URINALYSIS DIPSTICK
Bilirubin, UA: NEGATIVE
GLUCOSE UA: NEGATIVE
Ketones, UA: NEGATIVE
LEUKOCYTES UA: NEGATIVE
NITRITE UA: NEGATIVE
PROTEIN UA: NEGATIVE
Spec Grav, UA: 1.02
UROBILINOGEN UA: 0.2
pH, UA: 5.5

## 2016-07-28 MED ORDER — SULFAMETHOXAZOLE-TRIMETHOPRIM 800-160 MG PO TABS: 1.0000 | ORAL_TABLET | Freq: Two times a day (BID) | ORAL | 0 refills | Status: AC

## 2016-07-28 NOTE — Progress Notes (Signed)
Pre visit review using our clinic review tool, if applicable. No additional management support is needed unless otherwise documented below in the visit note. 

## 2016-07-28 NOTE — Progress Notes (Signed)
Subjective:    Patient ID: Luke Coleman, male    DOB: 10-Jul-1951, 65 y.o.   MRN: [id number]  CC: Luke Coleman is a 65 y.o. male who presents today for an acute visit.    HPI: CC: Dysuria for 6 days, slightly improved. No pain with bowel movement. Endorses suprapubic dull ache more on right side, right side, 'feels achey' in hands and feet , all which are better 'this morning.'  Also had pain over right testicle and swelling, no swelling or pain this morning.   No N, V., fever, chills.    No concern for STDs or new sexual partners. No history of recurrent UTIs.  H/o enlarged prostate, DM; h/o prostatitis  Fasting BS 118.       HISTORY:  Past Medical History:  Diagnosis Date  . Acute bacterial sinusitis 08/22/2014  . COPD (chronic obstructive pulmonary disease) ([place])   . Diabetes mellitus without complication ([place])   . Enlarged prostate   . Essential (primary) hypertension 03/01/2013   Overview:  Last Assessment & Plan:  Taking meds as prescribed. Check bmet. Continue to follow.   Marland Kitchen HTN (hypertension) 03/01/2013  . Hypertension   . Sleep apnea    on CPAP   Past Surgical History:  Procedure Laterality Date  . APPENDECTOMY    . CIRCUMCISION  Age 89  . HERNIA REPAIR    . NASAL SEPTUM SURGERY     For deviated septum  . SMALL INTESTINE SURGERY     Adhesions   Family History  Problem Relation Age of Onset  . Early death Mother     MVA - died age 75  . Alcohol abuse Father   . Hypertension Father   . Cancer Father 80    renal cell carcinoma  . Heart disease Father 18    MI - died  . Diabetes Brother   . Diabetes Maternal Uncle   . Diabetes Paternal Aunt   . Kidney disease Maternal Grandmother     Allergies: Patient has no known allergies. Current Outpatient Prescriptions on File Prior to Visit  Medication Sig Dispense Refill  . finasteride (PROSCAR) 5 MG tablet Take 1 tablet (5 mg total) by mouth daily. 90 tablet 2  . fluticasone (FLONASE) 50 MCG/ACT nasal  spray Place 2 sprays into both nostrils daily. 16 g 6  . lisinopril-hydrochlorothiazide (PRINZIDE,ZESTORETIC) 20-12.5 MG tablet Take 1 tablet by mouth daily. 90 tablet 3  . Multiple Vitamin (MULTIVITAMIN WITH MINERALS) TABS tablet Take 1 tablet by mouth daily.    . tadalafil (CIALIS) 5 MG tablet Take 1 tablet (5 mg total) by mouth daily. 90 tablet 3   No current facility-administered medications on file prior to visit.     Social History  Substance Use Topics  . Smoking status: Never Smoker  . Smokeless tobacco: Never Used  . Alcohol use No     Comment: Occasional    Review of Systems  Constitutional: Negative for chills and fever.  Respiratory: Negative for cough.   Cardiovascular: Negative for chest pain and palpitations.  Gastrointestinal: Negative for abdominal distention, abdominal pain, blood in stool, constipation, nausea and vomiting.  Genitourinary: Positive for dysuria. Negative for discharge, penile pain and penile swelling.  Musculoskeletal: Positive for myalgias.      Objective:    BP (!) 146/68   Pulse 75   Temp 97.6 F (36.4 C) (Oral)   Ht 5\' 7"  (1.702 m)   Wt 209 lb 3.2 oz (94.9 kg)   SpO2 95%  BMI 32.77 kg/m    Physical Exam  Constitutional: He appears well-developed and well-nourished.  Cardiovascular: Regular rhythm and normal heart sounds.   Pulmonary/Chest: Effort normal and breath sounds normal. No respiratory distress. He has no wheezes. He has no rales.  Abdominal: Soft. Normal appearance and bowel sounds are normal. He exhibits no distension and no mass. There is no tenderness. There is no rigidity, no rebound, no guarding and no CVA tenderness.  No suprapubic tenderness.   Genitourinary: Rectum normal. Rectal exam shows no external hemorrhoid, no mass and no tenderness. Prostate is enlarged and tender. Right testis shows no mass, no swelling and no tenderness. Left testis shows no mass, no swelling and no tenderness. No penile tenderness. No  discharge found.  Genitourinary Comments: Tender, edematous prostate on exam.   Neurological: He is alert.  Skin: Skin is warm and dry.  Psychiatric: He has a normal mood and affect. His speech is normal and behavior is normal.  Vitals reviewed.      Assessment & Plan:   1. Dysuria UA trace blood. Negative leukocytes, nitrites, glucose. Pending UC. Working diagnoses of bacterial prostatitis. He has atypical symptoms including joint pain in hands and feet, reassured that are improving however want to maintain close follow up. Pending CMP, CBC. F/u 2 days  - CULTURE, URINE COMPREHENSIVE  2. Scrotal pain No pain or swelling on exam. Pending US scrotum to ensure no underlying pathology.   I have discontinued Luke Coleman's pneumococcal 23 valent vaccine, HYDROcodone-homatropine, and amoxicillin-clavulanate. I am also having him start on sulfamethoxazole-trimethoprim. Additionally, I am having him maintain his multivitamin with minerals, tadalafil, lisinopril-hydrochlorothiazide, finasteride, fluticasone, Insulin Glargine-Lixisenatide, and metFORMIN.   Meds ordered this encounter  Medications  . sulfamethoxazole-trimethoprim (BACTRIM DS) 800-160 MG tablet    Sig: Take 1 tablet by mouth 2 (two) times daily.    Dispense:  84 tablet    Refill:  0    Order Specific Question:   Supervising Provider    Answer:   Deborra Medina L [[id number]]  . Insulin Glargine-Lixisenatide 100-33 UNT-MCG/ML SOPN    Sig: Inject into the skin.  . metFORMIN (GLUCOPHAGE-XR) 500 MG 24 hr tablet    Sig: Take by mouth.    Return precautions given.   Risks, benefits, and alternatives of the medications and treatment plan prescribed today were discussed, and patient expressed understanding.   Education regarding symptom management and diagnosis given to patient on AVS.  Continue to follow with Rubbie Battiest, NP for routine health maintenance.   [name] and I agreed with plan.   [name], FNP

## 2016-07-28 NOTE — Patient Instructions (Signed)
Suspect prostatitis; bactrim for 6 weeks.   Labs  Follow up 2 days  If there is no improvement in your symptoms, or if there is any worsening of symptoms, or if you have any additional concerns, please return for re-evaluation; or, if we are closed, consider going to the Emergency Room for evaluation if symptoms urgent.   Prostatitis Prostatitis is swelling or inflammation of the prostate gland. The prostate is a walnut-sized gland that is involved in the production of semen. It is located below a man's bladder, in front of the rectum. There are four types of prostatitis:  Chronic nonbacterial prostatitis. This is the most common type of prostatitis. It may be associated with a viral infection or autoimmune disorder.  Acute bacterial prostatitis. This is the least common type of prostatitis. It starts quickly and is usually associated with a bladder infection, high fever, and shaking chills. It can occur at any age.  Chronic bacterial prostatitis. This type usually results from acute bacterial prostatitis that happens repeatedly (is recurrent) or has not been treated properly. It can occur in men of any age but is most common among middle-aged men whose prostate has begun to get larger. The symptoms are not as severe as symptoms caused by acute bacterial prostatitis.  Prostatodynia or chronic pelvic pain syndrome (CPPS). This type is also called pelvic floor disorder. It is associated with increased muscular tone in the pelvis surrounding the prostate. What are the causes? Bacterial prostatitis is caused by infection from bacteria. Chronic nonbacterial prostatitis may be caused by:  Urinary tract infections (UTIs).  Nerve damage.  A response by the body's disease-fighting system (autoimmune response).  Chemicals in the urine. The causes of the other types of prostatitis are usually not known. What are the signs or symptoms? Symptoms of this condition vary depending upon the type of  prostatitis. If you have acute bacterial prostatitis, you may experience:  Urinary symptoms, such as:  Painful urination.  Burning during urination.  Frequent and sudden urges to urinate.  Inability to start urinating.  A weak or interrupted stream of urine.  Vomiting.  Nausea.  Fever.  Chills.  Inability to empty the bladder completely.  Pain in the:  Muscles or joints.  Lower back.  Lower abdomen. If you have any of the other types of prostatitis, you may experience:  Urinary symptoms, such as:  Sudden urges to urinate.  Frequent urination.  Difficulty starting urination.  Weak urine stream.  Dribbling after urination.  Discharge from the urethra. The urethra is a tube that opens at the end of the penis.  Pain in the:  Testicles.  Penis or tip of the penis.  Rectum.  Area in front of the rectum and below the scrotum (perineum).  Problems with sexual function.  Painful ejaculation.  Bloody semen. How is this diagnosed? This condition may be diagnosed based on:  A physical and medical exam.  Your symptoms.  A urine test to check for bacteria.  An exam in which a health care provider uses a finger to feel the prostate (digital rectal exam).  A test of a sample of semen.  Blood tests.  Ultrasound.  Removal of prostate tissue to be examined under a microscope (biopsy).  Tests to check how your body handles urine (urodynamic tests).  A test to look inside your bladder or urethra (cystoscopy). How is this treated? Treatment for this condition depends on the type of prostatitis. Treatment may involve:  Medicines to relieve pain or inflammation.  Medicines to help relax your muscles.  Physical therapy.  Heat therapy.  Techniques to help you control certain body functions (biofeedback).  Relaxation exercises.  Antibiotic medicine, if your condition is caused by bacteria.  Warm water baths (sitz baths). Sitz baths help with  relaxing your pelvic floor muscles, which helps to relieve pressure on the prostate. Follow these instructions at home:  Take over-the-counter and prescription medicines only as told by your health care provider.  If you were prescribed an antibiotic, take it as told by your health care provider. Do not stop taking the antibiotic even if you start to feel better.  If physical therapy, biofeedback, or relaxation exercises were prescribed, do exercises as instructed.  Take sitz baths as directed by your health care provider. For a sitz bath, sit in warm water that is deep enough to cover your hips and buttocks.  Keep all follow-up visits as told by your health care provider. This is important. Contact a health care provider if:  Your symptoms get worse.  You have a fever. Get help right away if:  You have chills.  You feel nauseous.  You vomit.  You feel light-headed or feel like you are going to faint.  You are unable to urinate.  You have blood or blood clots in your urine. This information is not intended to replace advice given to you by your health care provider. Make sure you discuss any questions you have with your health care provider. Document Released: 08/07/2000 Document Revised: 04/30/2016 Document Reviewed: 04/30/2016 Elsevier Interactive Patient Education  2017 [employer].

## 2016-07-29 ENCOUNTER — Other Ambulatory Visit: Payer: Self-pay | Admitting: Family

## 2016-07-29 DIAGNOSIS — N5089 Other specified disorders of the male genital organs: Secondary | ICD-10-CM

## 2016-07-30 LAB — CULTURE, URINE COMPREHENSIVE: Organism ID, Bacteria: NO GROWTH

## 2016-08-03 ENCOUNTER — Ambulatory Visit: Payer: BC Managed Care – PPO

## 2016-08-03 ENCOUNTER — Telehealth: Payer: Self-pay | Admitting: Family

## 2016-08-03 NOTE — Telephone Encounter (Signed)
Koren Bound when patient calls back   Left message for patient regarding normal urine culture  No bacteria seen however this can happen with prostatitis.   How are his symptoms?  If he is not improving- I would change medication to ciprfloxacin.   Please make f/u appt in 3 weeks .

## 2016-08-03 NOTE — Telephone Encounter (Signed)
Please call pt at [phone]

## 2016-08-04 NOTE — Telephone Encounter (Signed)
Left message for patient to return call back.  

## 2016-08-04 NOTE — Telephone Encounter (Signed)
Patient was informed of results.  Patient understood and no questions, comments, or concerns at this time. Patient will return call to schedule appointment for FU.

## 2016-08-22 ENCOUNTER — Other Ambulatory Visit: Payer: Self-pay | Admitting: Nurse Practitioner

## 2016-11-25 ENCOUNTER — Telehealth: Payer: Self-pay

## 2016-11-25 NOTE — Telephone Encounter (Signed)
Pt pharmacy sent a refill request of cialis. Medication DENIED. Pt needs an appt.

## 2016-12-24 ENCOUNTER — Ambulatory Visit (INDEPENDENT_AMBULATORY_CARE_PROVIDER_SITE_OTHER): Payer: BC Managed Care – PPO | Admitting: Family Medicine

## 2016-12-24 ENCOUNTER — Encounter: Payer: Self-pay | Admitting: Family Medicine

## 2016-12-24 ENCOUNTER — Telehealth: Payer: Self-pay | Admitting: *Deleted

## 2016-12-24 VITALS — BP 142/78 | HR 36 | Temp 97.7°F | Wt 212.2 lb

## 2016-12-24 DIAGNOSIS — I499 Cardiac arrhythmia, unspecified: Secondary | ICD-10-CM | POA: Insufficient documentation

## 2016-12-24 DIAGNOSIS — K529 Noninfective gastroenteritis and colitis, unspecified: Secondary | ICD-10-CM

## 2016-12-24 LAB — COMPREHENSIVE METABOLIC PANEL
ALT: 20 U/L (ref 0–53)
AST: 15 U/L (ref 0–37)
Albumin: 4.7 g/dL (ref 3.5–5.2)
Alkaline Phosphatase: 50 U/L (ref 39–117)
BUN: 29 mg/dL — AB (ref 6–23)
CHLORIDE: 102 meq/L (ref 96–112)
CO2: 27 meq/L (ref 19–32)
CREATININE: 1.47 mg/dL (ref 0.40–1.50)
Calcium: 8.3 mg/dL — ABNORMAL LOW (ref 8.4–10.5)
GFR: 51.06 mL/min — ABNORMAL LOW (ref >60.00)
Glucose, Bld: 146 mg/dL — ABNORMAL HIGH (ref 70–99)
POTASSIUM: 4.4 meq/L (ref 3.5–5.1)
SODIUM: 135 meq/L (ref 135–145)
Total Bilirubin: 0.7 mg/dL (ref 0.2–1.2)
Total Protein: 7.3 g/dL (ref 6.0–8.3)

## 2016-12-24 MED ORDER — ONDANSETRON HCL 4 MG PO TABS: 4.0000 mg | ORAL_TABLET | Freq: Three times a day (TID) | ORAL | 0 refills | Status: DC | PRN

## 2016-12-24 NOTE — Assessment & Plan Note (Signed)
Noted on exam. EKG with PACs and PVCs. Otherwise unremarkable.

## 2016-12-24 NOTE — Telephone Encounter (Signed)
Wife requested a call, pt was seen in the office today.  Pt  reporter to her that his kidneys were shutting down, this raises concerns and she requested a call for better understanding of his visit today.   Contact [name] [phone] is on [facility]

## 2016-12-24 NOTE — Telephone Encounter (Signed)
Dr. Lacinda Axon has seen this patient today.

## 2016-12-24 NOTE — Telephone Encounter (Signed)
pts wife was informed that pt had normal kidney function.  She was told that pt needs to hydrate with water to keep from getting dehydrated.

## 2016-12-24 NOTE — Patient Instructions (Signed)
We will call with your labs results.  Hold the metformin and the BP medication.  Zofran for nausea.  Hydrate, hydrate, hydrate.  Take care  Dr. Lacinda Axon

## 2016-12-24 NOTE — Progress Notes (Signed)
Subjective:  Patient ID: Luke Coleman, male    DOB: 1951/07/29  Age: 66 y.o. MRN: [id number]  CC: Diarrhea  HPI:  66 year old male with hypertension and DM 2 presents with complaints of diarrhea.  Patient reports a three-day history of diarrhea, nausea, and abdominal cramps. He's had a recent sick contact. No associated fever. He does endorse chills and fatigue. He has had some diffuse abdominal cramping/soreness. No vomiting. No hematochezia or melena. He's been taking Pepto-Bismol, Imodium without improvement. He reports decreased by mouth intake. No other associated symptoms. No other complaints or concerns at this time.  Social Hx   Social History   Social History  . Marital status: Married    Spouse name: Robin Searing  . Number of children: 1  . Years of education: 27   Occupational History  . Scientist, physiological of Continuing Education     [employer]   Social History Main Topics  . Smoking status: Never Smoker  . Smokeless tobacco: Never Used  . Alcohol use No     Comment: Occasional  . Drug use: No  . Sexual activity: Yes    Partners: Female   Other Topics Concern  . None   Social History Narrative   Mr. Eltringham grew up in [place], [place], [place]. He currently is living in [place], [place] with his wife of 2 months. He was married previously to his second wife for 29 years. Previous to that, he was married to his first wife for 7 years and has 1 daughter from this marriage (69 y/o). He widowed in 2013 (second wife). 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. He obtained a Oceanographer in Scientist, physiological, [employer] from [employer]. 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 He enjoys baseball. Enjoys the outdoors. He also enjoys watching old movies. 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   Review of Systems  Constitutional:  Positive for chills and fatigue. Negative for fever.  Gastrointestinal: Positive for abdominal pain and diarrhea.   Objective:  BP (!) 142/78   Pulse (!) 36   Temp 97.7 F (36.5 C) (Oral)   Wt 212 lb 4 oz (96.3 kg)   SpO2 97%   BMI 33.24 kg/m   BP/Weight 12/24/2016 07/28/2016 7/42/5956  Systolic BP 387 564 332  Diastolic BP 78 68 72  Wt. (Lbs) 212.25 209.2 209.12  BMI 33.24 32.77 32.75   Physical Exam  Constitutional: He is oriented to person, place, and time. He appears well-developed. No distress.  Appears fatigued.  Cardiovascular: A regularly irregular rhythm present.  Pulmonary/Chest: Effort normal. He has no wheezes. He has no rales.  Abdominal: Soft. He exhibits no distension.  Neurological: He is alert and oriented to person, place, and time.  Psychiatric: He has a normal mood and affect.  Vitals reviewed.   Lab Results  Component Value Date   WBC 8.1 07/28/2016   HGB 16.0 07/28/2016   HCT 46.2 07/28/2016   PLT 178.0 07/28/2016   GLUCOSE 102 (H) 07/28/2016   ALT 20 07/28/2016   AST 16 07/28/2016   NA 140 07/28/2016   K 4.6 07/28/2016   CL 103 07/28/2016   CREATININE 1.20 07/28/2016   BUN 24 (H) 07/28/2016   CO2 29 07/28/2016   HGBA1C 11.0 (H) 09/04/2015   MICROALBUR 2.7 (H) 03/29/2015    Assessment & Plan:   Problem  List Items Addressed This Visit    Gastroenteritis - Primary    New problem. Likely viral in origin. Zofran as needed for nausea. Metabolic panel today to assess for collection like disturbance and potential kidney dysfunction. Advised aggressive hydration. I also advised him to hold his blood pressure medication and metformin in the setting of decreased by mouth intake/diarrhea.      Relevant Orders   Comprehensive metabolic panel   Irregular heartbeat    Noted on exam. EKG with PACs and PVCs. Otherwise unremarkable.      Relevant Orders   EKG 12-Lead (Completed)      Meds ordered this encounter  Medications  . ondansetron  (ZOFRAN) 4 MG tablet    Sig: Take 1 tablet (4 mg total) by mouth every 8 (eight) hours as needed for nausea or vomiting.    Dispense:  20 tablet    Refill:  0   Follow-up: PRN  [place]

## 2016-12-24 NOTE — Progress Notes (Signed)
Pre visit review using our clinic review tool, if applicable. No additional management support is needed unless otherwise documented below in the visit note. 

## 2016-12-24 NOTE — Assessment & Plan Note (Signed)
New problem. Likely viral in origin. Zofran as needed for nausea. Metabolic panel today to assess for collection like disturbance and potential kidney dysfunction. Advised aggressive hydration. I also advised him to hold his blood pressure medication and metformin in the setting of decreased by mouth intake/diarrhea.

## 2016-12-25 ENCOUNTER — Other Ambulatory Visit: Payer: Self-pay | Admitting: Family Medicine

## 2017-01-14 ENCOUNTER — Other Ambulatory Visit: Payer: Self-pay | Admitting: Family Medicine

## 2017-01-14 DIAGNOSIS — N400 Enlarged prostate without lower urinary tract symptoms: Secondary | ICD-10-CM

## 2017-02-02 ENCOUNTER — Other Ambulatory Visit: Payer: Self-pay | Admitting: Family Medicine

## 2017-03-04 ENCOUNTER — Other Ambulatory Visit: Payer: Self-pay | Admitting: Family Medicine

## 2017-04-05 ENCOUNTER — Other Ambulatory Visit: Payer: Self-pay | Admitting: Family Medicine

## 2017-04-12 ENCOUNTER — Other Ambulatory Visit: Payer: Self-pay | Admitting: Family Medicine

## 2017-04-12 DIAGNOSIS — N400 Enlarged prostate without lower urinary tract symptoms: Secondary | ICD-10-CM

## 2017-05-06 ENCOUNTER — Other Ambulatory Visit: Payer: Self-pay | Admitting: Family Medicine

## 2017-05-10 ENCOUNTER — Telehealth: Payer: Self-pay | Admitting: Family

## 2017-05-10 NOTE — Telephone Encounter (Signed)
Left message for patient to call office needs FU appointment for [facility] Quality metrics for Colonoscopy and for Microalbumin. Needs to FU PCP. Please scheduled

## 2017-05-13 ENCOUNTER — Encounter (INDEPENDENT_AMBULATORY_CARE_PROVIDER_SITE_OTHER): Payer: Self-pay

## 2017-05-13 ENCOUNTER — Encounter: Payer: Self-pay | Admitting: Family

## 2017-05-13 ENCOUNTER — Ambulatory Visit (INDEPENDENT_AMBULATORY_CARE_PROVIDER_SITE_OTHER): Payer: BC Managed Care – PPO | Admitting: Family

## 2017-05-13 VITALS — BP 130/70 | HR 72 | Temp 98.5°F | Ht 67.0 in | Wt 217.2 lb

## 2017-05-13 DIAGNOSIS — N419 Inflammatory disease of prostate, unspecified: Secondary | ICD-10-CM

## 2017-05-13 DIAGNOSIS — M25572 Pain in left ankle and joints of left foot: Secondary | ICD-10-CM

## 2017-05-13 DIAGNOSIS — G8929 Other chronic pain: Secondary | ICD-10-CM | POA: Insufficient documentation

## 2017-05-13 DIAGNOSIS — E118 Type 2 diabetes mellitus with unspecified complications: Secondary | ICD-10-CM | POA: Diagnosis not present

## 2017-05-13 DIAGNOSIS — I10 Essential (primary) hypertension: Secondary | ICD-10-CM | POA: Diagnosis not present

## 2017-05-13 LAB — PSA: PSA: 0.47 ng/mL (ref 0.10–4.00)

## 2017-05-13 LAB — COMPREHENSIVE METABOLIC PANEL
ALBUMIN: 4.6 g/dL (ref 3.5–5.2)
ALK PHOS: 45 U/L (ref 39–117)
ALT: 21 U/L (ref 0–53)
AST: 14 U/L (ref 0–37)
BUN: 33 mg/dL — ABNORMAL HIGH (ref 6–23)
CO2: 30 meq/L (ref 19–32)
Calcium: 9.1 mg/dL (ref 8.4–10.5)
Chloride: 102 mEq/L (ref 96–112)
Creatinine, Ser: 1.68 mg/dL — ABNORMAL HIGH (ref 0.40–1.50)
GFR: 43.71 mL/min — ABNORMAL LOW (ref >60.00)
GLUCOSE: 145 mg/dL — AB (ref 70–99)
Potassium: 4.5 mEq/L (ref 3.5–5.1)
SODIUM: 139 meq/L (ref 135–145)
Total Bilirubin: 0.5 mg/dL (ref 0.2–1.2)
Total Protein: 7 g/dL (ref 6.0–8.3)

## 2017-05-13 LAB — MICROALBUMIN / CREATININE URINE RATIO
Creatinine,U: 349.6 mg/dL
MICROALB/CREAT RATIO: 1.9 mg/g (ref 0.0–30.0)
Microalb, Ur: 6.6 mg/dL — ABNORMAL HIGH (ref 0.0–1.9)

## 2017-05-13 LAB — POCT URINALYSIS DIPSTICK
Blood, UA: NEGATIVE
Glucose, UA: NEGATIVE
LEUKOCYTES UA: NEGATIVE
NITRITE UA: NEGATIVE
PH UA: 5.5 (ref 5.0–8.0)
Spec Grav, UA: 1.025 (ref 1.010–1.025)
Urobilinogen, UA: 0.2 E.U./dL

## 2017-05-13 LAB — URINALYSIS, MICROSCOPIC ONLY: RBC / HPF: NONE SEEN (ref 0–?)

## 2017-05-13 MED ORDER — SULFAMETHOXAZOLE-TRIMETHOPRIM 800-160 MG PO TABS: 1.0000 | ORAL_TABLET | Freq: Two times a day (BID) | ORAL | 0 refills | Status: DC

## 2017-05-13 NOTE — Assessment & Plan Note (Addendum)
suspect achilles tendinopathy. Reassured by benign exam. Also reassured this symptom is improving. Offered referral to sports medicine for [name] diagnosis and rehab; patient politely declines and would like to give it more time. Emphasized importance of conservative therapy including ice, rest, compression. Return precautions given.

## 2017-05-13 NOTE — Assessment & Plan Note (Signed)
Follows with endocrine. Declines a1c today as this is done by endocrine.

## 2017-05-13 NOTE — Progress Notes (Signed)
Subjective:    Patient ID: Luke Coleman, male    DOB: 24-Jan-1951, 66 y.o.   MRN: [id number]  CC: Luke Coleman is a 66 y.o. male who presents today for follow up.   HPI: Feels like 'has prostate infection' with low back pain x 2 weeks, worsening. States dysuria, frequency. Trying to drink lots of water   No longer taking cialis . Had started on years ago for enlarged prostate.   NO fever, chills, N, V, hematuria, penile discharge or pain.   Also complains of left ankle pain and pulled a muscle in ankle, improving. Injury occurred when walking a treadmill at 'too high of an incline' 6 months. Hard to walk on and would like to get back to gym. Tried ibuprofen without much relief.   HTN-compliant with medication. Denies exertional chest pain or pressure, numbness or tingling radiating to left arm or jaw, palpitations, dizziness, frequent headaches, changes in vision, or shortness of breath.   DM- follows with endocrine, sees them next month who monitors a1c.       HISTORY:  Past Medical History:  Diagnosis Date  . Acute bacterial sinusitis 08/22/2014  . COPD (chronic obstructive pulmonary disease) ([place])   . Diabetes mellitus without complication ([place])   . Enlarged prostate   . Essential (primary) hypertension 03/01/2013   Overview:  Last Assessment & Plan:  Taking meds as prescribed. Check bmet. Continue to follow.   Marland Kitchen HTN (hypertension) 03/01/2013  . Hypertension   . Sleep apnea    on CPAP   Past Surgical History:  Procedure Laterality Date  . APPENDECTOMY    . CIRCUMCISION  Age 81  . HERNIA REPAIR    . NASAL SEPTUM SURGERY     For deviated septum  . SMALL INTESTINE SURGERY     Adhesions   Family History  Problem Relation Age of Onset  . Early death Mother        MVA - died age 37  . Alcohol abuse Father   . Hypertension Father   . Cancer Father 64       renal cell carcinoma  . Heart disease Father 46       MI - died  . Diabetes Brother   . Diabetes Maternal  Uncle   . Diabetes Paternal Aunt   . Kidney disease Maternal Grandmother     Allergies: Patient has no known allergies. Current Outpatient Prescriptions on File Prior to Visit  Medication Sig Dispense Refill  . finasteride (PROSCAR) 5 MG tablet take 1 tablet by mouth once daily 90 tablet 0  . fluticasone (FLONASE) 50 MCG/ACT nasal spray Place 2 sprays into both nostrils daily. 16 g 6  . Insulin Glargine-Lixisenatide 100-33 UNT-MCG/ML SOPN Inject into the skin.    Marland Kitchen lisinopril-hydrochlorothiazide (PRINZIDE,ZESTORETIC) 20-12.5 MG tablet take 1 tablet by mouth once daily 30 tablet 0  . metFORMIN (GLUCOPHAGE-XR) 500 MG 24 hr tablet Take by mouth.    . Multiple Vitamin (MULTIVITAMIN WITH MINERALS) TABS tablet Take 1 tablet by mouth daily.    . ondansetron (ZOFRAN) 4 MG tablet Take 1 tablet (4 mg total) by mouth every 8 (eight) hours as needed for nausea or vomiting. 20 tablet 0   No current facility-administered medications on file prior to visit.     Social History  Substance Use Topics  . Smoking status: Never Smoker  . Smokeless tobacco: Never Used  . Alcohol use No     Comment: Occasional    Review of Systems  Constitutional: Negative for chills and fever.  Respiratory: Negative for cough.   Cardiovascular: Negative for chest pain and palpitations.  Gastrointestinal: Negative for nausea and vomiting.  Genitourinary: Positive for dysuria, frequency and urgency. Negative for discharge.  Musculoskeletal: Positive for joint swelling.      Objective:    BP 130/70   Pulse 72   Temp 98.5 F (36.9 C) (Oral)   Ht 5\' 7"  (1.702 m)   Wt 217 lb 3.2 oz (98.5 kg)   SpO2 97%   BMI 34.02 kg/m  BP Readings from Last 3 Encounters:  05/13/17 130/70  12/24/16 (!) 142/78  07/28/16 (!) 146/68   Wt Readings from Last 3 Encounters:  05/13/17 217 lb 3.2 oz (98.5 kg)  12/24/16 212 lb 4 oz (96.3 kg)  07/28/16 209 lb 3.2 oz (94.9 kg)    Physical Exam  Constitutional: He appears  well-developed and well-nourished.  Cardiovascular: Regular rhythm and normal heart sounds.   Pulmonary/Chest: Effort normal and breath sounds normal. No respiratory distress. He has no wheezes. He has no rhonchi. He has no rales.  Genitourinary: Prostate is enlarged and tender.  Genitourinary Comments: No masses appreciated. Marked tenderness  Musculoskeletal:       Left ankle: He exhibits normal range of motion, no swelling, no ecchymosis and no laceration. Tenderness.       Feet:  Tenderness posterior calcaneus as marked on diagram. No ecchymosis, swelling appreciated.  Neurological: He is alert.  Skin: Skin is warm and dry.  Psychiatric: He has a normal mood and affect. His speech is normal and behavior is normal.  Vitals reviewed.      Assessment & Plan:   Problem List Items Addressed This Visit      Cardiovascular and Mediastinum   Essential (primary) hypertension    Controlled. Continue current regimen.       Relevant Orders   Comprehensive metabolic panel (Completed)     Endocrine   Diabetes mellitus, type 2 ([place])    Follows with endocrine. Declines a1c today as this is done by endocrine.         Genitourinary   Prostatitis - Primary      Due to prior history prostatitis and exam, patient and I agreed to go ahead and treat empirically for prostatitis. Pending urine studies. Follow-up in 6 weeks' time to determine if we should continue antibiotics. Return precautions given.      Relevant Medications   sulfamethoxazole-trimethoprim (BACTRIM DS) 800-160 MG tablet   Other Relevant Orders   Microalbumin / creatinine urine ratio (Completed)   POCT urinalysis dipstick (Completed)   CULTURE, URINE COMPREHENSIVE   PSA (Completed)   Urine Microscopic Only (Completed)     Other   Acute left ankle pain    suspect achilles tendinopathy. Reassured by benign exam. Also reassured this symptom is improving. Offered referral to sports medicine for [name] diagnosis and rehab;  patient politely declines and would like to give it more time. Emphasized importance of conservative therapy including ice, rest, compression. Return precautions given.           I have discontinued Mr. Diebold's tadalafil. I am also having him maintain his multivitamin with minerals, fluticasone, Insulin Glargine-Lixisenatide, metFORMIN, ondansetron, finasteride, lisinopril-hydrochlorothiazide, and sulfamethoxazole-trimethoprim.   Meds ordered this encounter  Medications  . DISCONTD: sulfamethoxazole-trimethoprim (BACTRIM DS) 800-160 MG tablet    Sig: Take 1 tablet by mouth 2 (two) times daily. 6 weeks duration    Dispense:  84 tablet    Refill:  0    Order Specific Question:   Supervising Provider    Answer:   Deborra Medina L [[id number]]  . sulfamethoxazole-trimethoprim (BACTRIM DS) 800-160 MG tablet    Sig: Take 1 tablet by mouth 2 (two) times daily. 6 weeks duration    Dispense:  84 tablet    Refill:  0    Return precautions given.   Risks, benefits, and alternatives of the medications and treatment plan prescribed today were discussed, and patient expressed understanding.   Education regarding symptom management and diagnosis given to patient on AVS.  Continue to follow with Burnard Hawthorne, FNP for routine health maintenance.   [name] and I agreed with plan.   [name], FNP

## 2017-05-13 NOTE — Patient Instructions (Addendum)
Let me know about colonoscopy and if you change your mind  Let me know about ankle pain.  Labs today Urine  Will treat empirically for prostatitis.   Follow up in 6 weeks to see if we need to continue therapy   Prostatitis Prostatitis is swelling or inflammation of the prostate gland. The prostate is a walnut-sized gland that is involved in the production of semen. It is located below a man's bladder, in front of the rectum. There are four types of prostatitis:  Chronic nonbacterial prostatitis. This is the most common type of prostatitis. It may be associated with a viral infection or autoimmune disorder.  Acute bacterial prostatitis. This is the least common type of prostatitis. It starts quickly and is usually associated with a bladder infection, high fever, and shaking chills. It can occur at any age.  Chronic bacterial prostatitis. This type usually results from acute bacterial prostatitis that happens repeatedly (is recurrent) or has not been treated properly. It can occur in men of any age but is most common among middle-aged men whose prostate has begun to get larger. The symptoms are not as severe as symptoms caused by acute bacterial prostatitis.  Prostatodynia or chronic pelvic pain syndrome (CPPS). This type is also called pelvic floor disorder. It is associated with increased muscular tone in the pelvis surrounding the prostate.  What are the causes? Bacterial prostatitis is caused by infection from bacteria. Chronic nonbacterial prostatitis may be caused by:  Urinary tract infections (UTIs).  Nerve damage.  A response by the body's disease-fighting system (autoimmune response).  Chemicals in the urine.  The causes of the other types of prostatitis are usually not known. What are the signs or symptoms? Symptoms of this condition vary depending upon the type of prostatitis. If you have acute bacterial prostatitis, you may experience:  Urinary symptoms, such  as: ? Painful urination. ? Burning during urination. ? Frequent and sudden urges to urinate. ? Inability to start urinating. ? A weak or interrupted stream of urine.  Vomiting.  Nausea.  Fever.  Chills.  Inability to empty the bladder completely.  Pain in the: ? Muscles or joints. ? Lower back. ? Lower abdomen.  If you have any of the other types of prostatitis, you may experience:  Urinary symptoms, such as: ? Sudden urges to urinate. ? Frequent urination. ? Difficulty starting urination. ? Weak urine stream. ? Dribbling after urination.  Discharge from the urethra. The urethra is a tube that opens at the end of the penis.  Pain in the: ? Testicles. ? Penis or tip of the penis. ? Rectum. ? Area in front of the rectum and below the scrotum (perineum).  Problems with sexual function.  Painful ejaculation.  Bloody semen.  How is this diagnosed? This condition may be diagnosed based on:  A physical and medical exam.  Your symptoms.  A urine test to check for bacteria.  An exam in which a health care provider uses a finger to feel the prostate (digital rectal exam).  A test of a sample of semen.  Blood tests.  Ultrasound.  Removal of prostate tissue to be examined under a microscope (biopsy).  Tests to check how your body handles urine (urodynamic tests).  A test to look inside your bladder or urethra (cystoscopy).  How is this treated? Treatment for this condition depends on the type of prostatitis. Treatment may involve:  Medicines to relieve pain or inflammation.  Medicines to help relax your muscles.  Physical therapy.  Heat therapy.  Techniques to help you control certain body functions (biofeedback).  Relaxation exercises.  Antibiotic medicine, if your condition is caused by bacteria.  Warm water baths (sitz baths). Sitz baths help with relaxing your pelvic floor muscles, which helps to relieve pressure on the prostate.  Follow  these instructions at home:  Take over-the-counter and prescription medicines only as told by your health care provider.  If you were prescribed an antibiotic, take it as told by your health care provider. Do not stop taking the antibiotic even if you start to feel better.  If physical therapy, biofeedback, or relaxation exercises were prescribed, do exercises as instructed.  Take sitz baths as directed by your health care provider. For a sitz bath, sit in warm water that is deep enough to cover your hips and buttocks.  Keep all follow-up visits as told by your health care provider. This is important. Contact a health care provider if:  Your symptoms get worse.  You have a fever. Get help right away if:  You have chills.  You feel nauseous.  You vomit.  You feel light-headed or feel like you are going to faint.  You are unable to urinate.  You have blood or blood clots in your urine. This information is not intended to replace advice given to you by your health care provider. Make sure you discuss any questions you have with your health care provider. Document Released: 08/07/2000 Document Revised: 04/30/2016 Document Reviewed: 04/30/2016 Elsevier Interactive Patient Education  2017 [employer].

## 2017-05-13 NOTE — Assessment & Plan Note (Signed)
   Due to prior history prostatitis and exam, patient and I agreed to go ahead and treat empirically for prostatitis. Pending urine studies. Follow-up in 6 weeks' time to determine if we should continue antibiotics. Return precautions given.

## 2017-05-13 NOTE — Assessment & Plan Note (Signed)
Controlled. Continue current regimen. 

## 2017-05-15 ENCOUNTER — Encounter: Payer: Self-pay | Admitting: Family

## 2017-05-15 LAB — CULTURE, URINE COMPREHENSIVE
MICRO NUMBER: 81041682
RESULT:: NO GROWTH
SPECIMEN QUALITY: ADEQUATE

## 2017-05-30 ENCOUNTER — Other Ambulatory Visit: Payer: Self-pay | Admitting: Family Medicine

## 2017-06-30 ENCOUNTER — Other Ambulatory Visit: Payer: Self-pay | Admitting: Family

## 2017-07-22 ENCOUNTER — Other Ambulatory Visit: Payer: Self-pay | Admitting: Family Medicine

## 2017-07-22 DIAGNOSIS — N400 Enlarged prostate without lower urinary tract symptoms: Secondary | ICD-10-CM

## 2017-07-26 IMAGING — CR DG CHEST 2V
2 series · 2 of 2 positions shown · non-contrast
Comparison: None.

CLINICAL DATA: Bronchitis. Chest discomfort and heaviness.
Hypertension.

EXAM:
CHEST  2 VIEW

[chest pa]
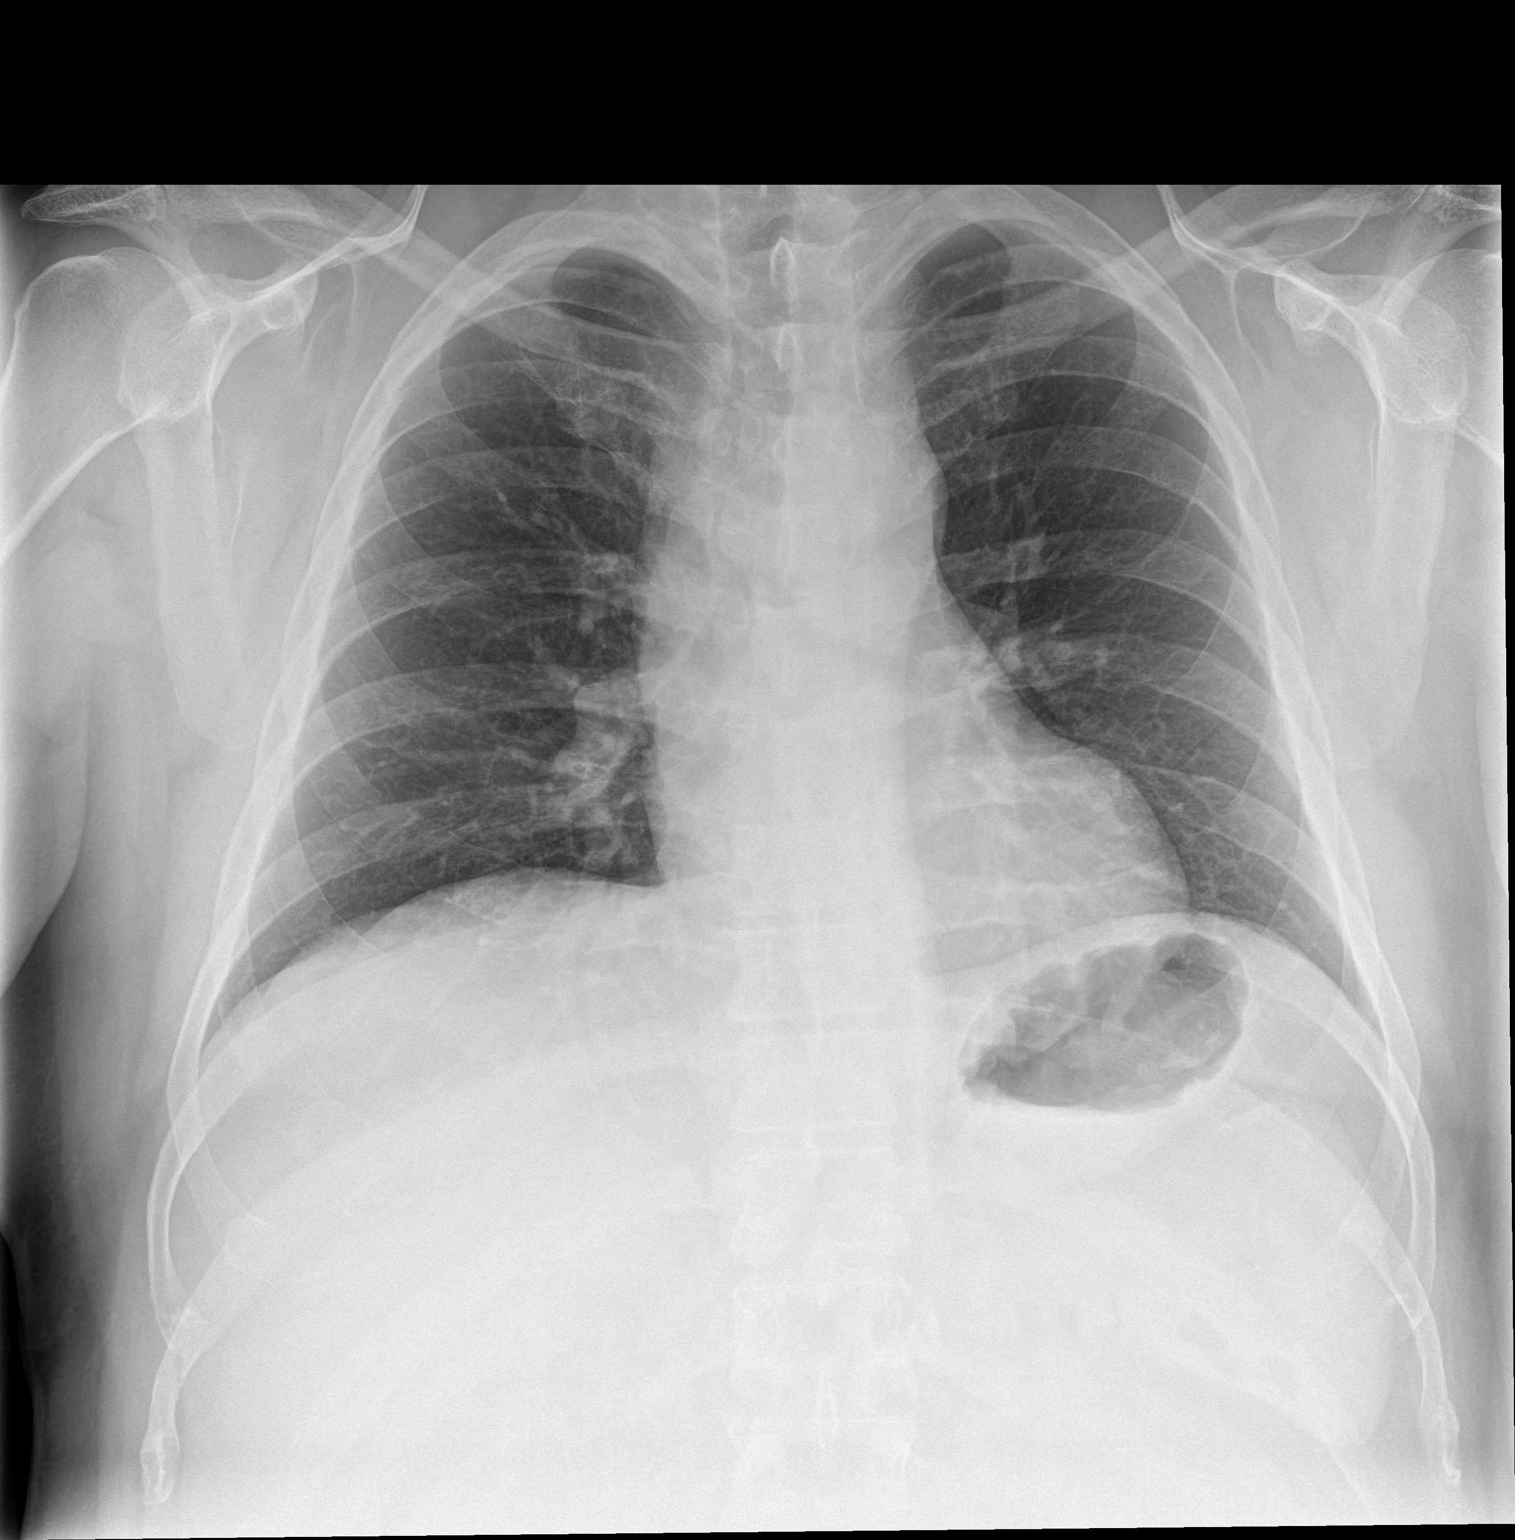

[chest lat]
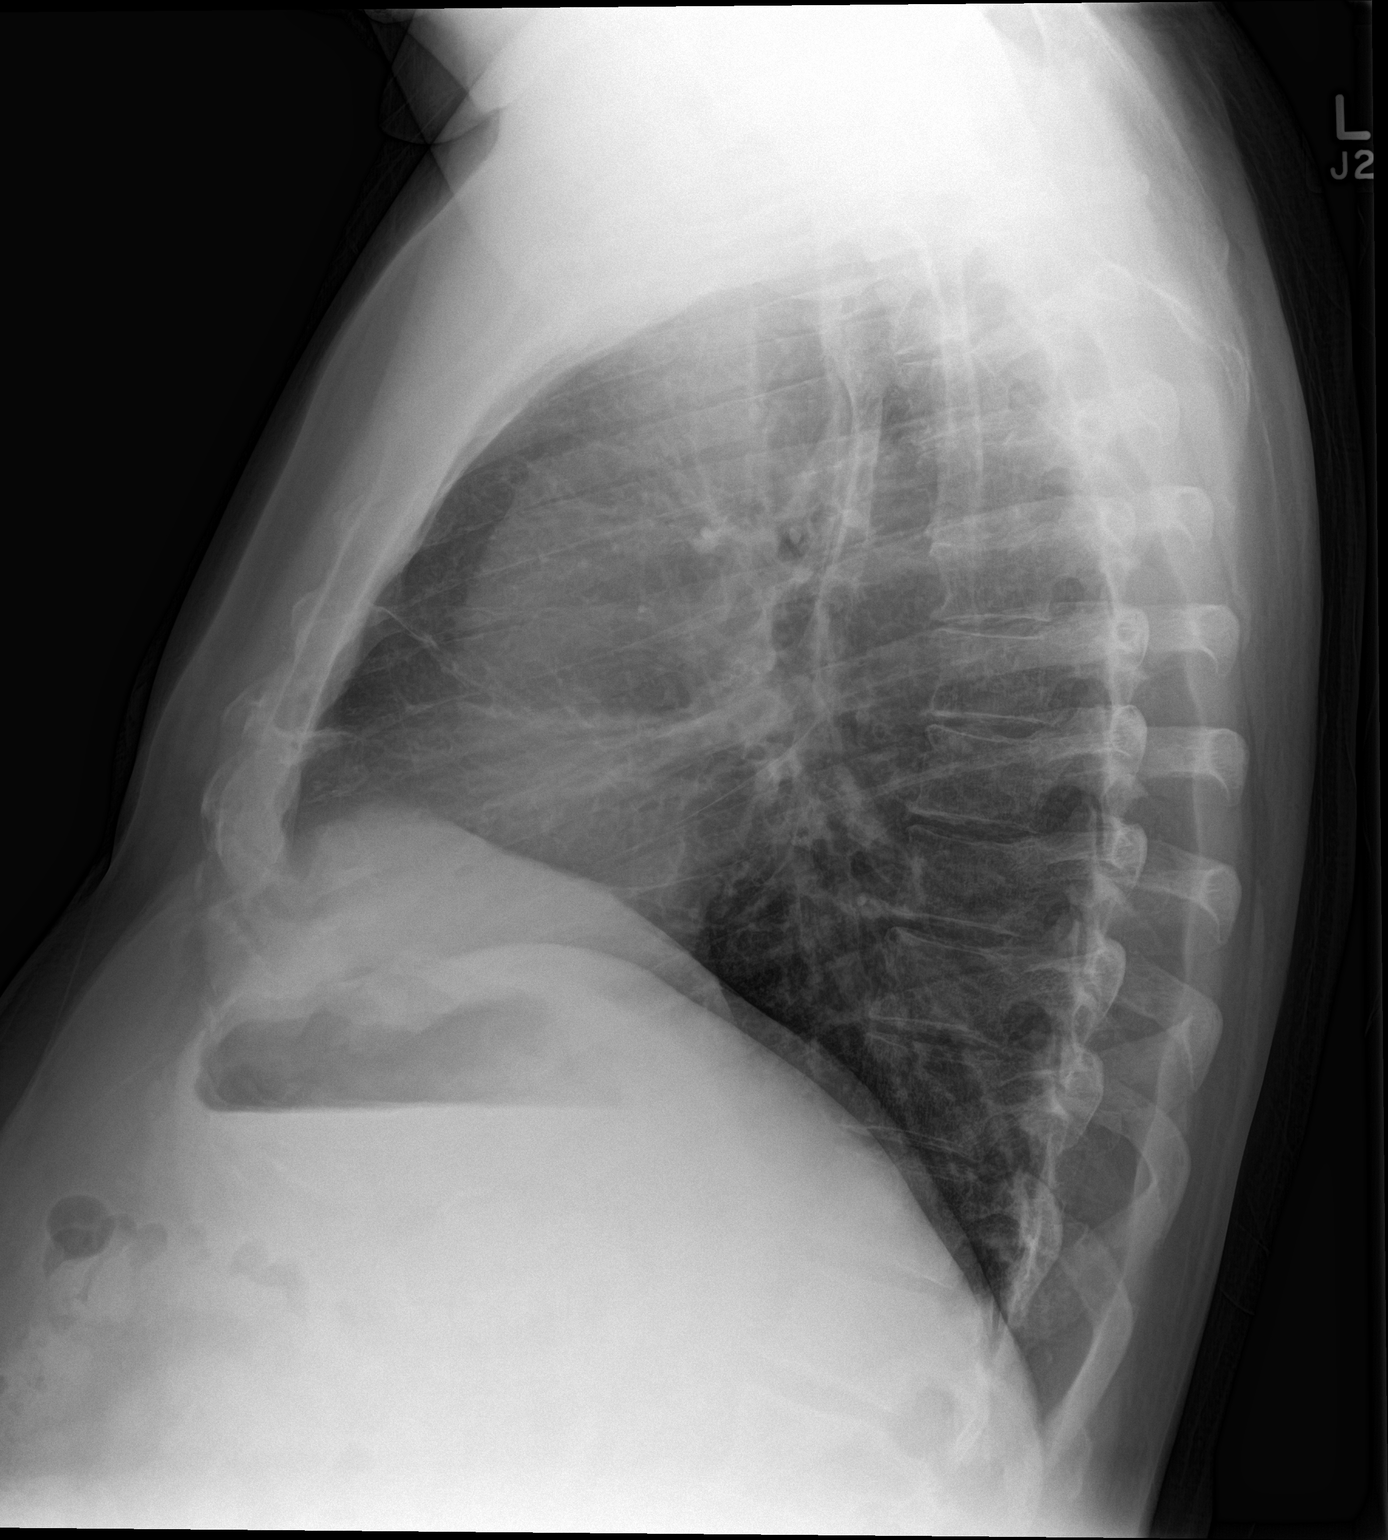

[2 of 2 positions shown; findings below may reference images not displayed]

FINDINGS: The heart size and mediastinal contours are within normal limits.
Both lungs are clear. The visualized skeletal structures are
unremarkable.
IMPRESSION: No active cardiopulmonary disease.

## 2017-08-08 ENCOUNTER — Other Ambulatory Visit: Payer: Self-pay | Admitting: Family

## 2017-08-11 ENCOUNTER — Other Ambulatory Visit: Payer: Self-pay

## 2017-08-11 MED ORDER — LISINOPRIL-HYDROCHLOROTHIAZIDE 20-12.5 MG PO TABS: 1.0000 | ORAL_TABLET | Freq: Every day | ORAL | 1 refills | Status: DC

## 2017-09-29 ENCOUNTER — Telehealth: Payer: Self-pay | Admitting: *Deleted

## 2017-09-29 ENCOUNTER — Ambulatory Visit: Payer: BC Managed Care – PPO | Admitting: Family Medicine

## 2017-09-29 DIAGNOSIS — Z0289 Encounter for other administrative examinations: Secondary | ICD-10-CM

## 2017-09-29 NOTE — Telephone Encounter (Signed)
Copied from [place] [id number]. Topic: Quick Communication - Appointment Cancellation >> Sep 29, 2017  8:14 AM Darl Householder, RMA wrote: Patient called to cancel appointment scheduled for 09/29/17 @ 3:15 pm. Patient has not rescheduled their appointment.

## 2017-09-29 NOTE — Telephone Encounter (Signed)
No need to charge late cancellation fee thanks.

## 2017-09-29 NOTE — Telephone Encounter (Signed)
Should patient be cancelled a late cancellation fee?

## 2017-11-05 ENCOUNTER — Ambulatory Visit (INDEPENDENT_AMBULATORY_CARE_PROVIDER_SITE_OTHER): Payer: BC Managed Care – PPO

## 2017-11-05 VITALS — BP 118/70 | HR 98 | Temp 98.0°F | Resp 14 | Ht 67.0 in | Wt 216.4 lb

## 2017-11-05 DIAGNOSIS — Z0000 Encounter for general adult medical examination without abnormal findings: Secondary | ICD-10-CM | POA: Diagnosis not present

## 2017-11-05 DIAGNOSIS — I10 Essential (primary) hypertension: Secondary | ICD-10-CM | POA: Diagnosis not present

## 2017-11-05 DIAGNOSIS — Z1159 Encounter for screening for other viral diseases: Secondary | ICD-10-CM | POA: Diagnosis not present

## 2017-11-05 DIAGNOSIS — R739 Hyperglycemia, unspecified: Secondary | ICD-10-CM

## 2017-11-05 DIAGNOSIS — Z1211 Encounter for screening for malignant neoplasm of colon: Secondary | ICD-10-CM

## 2017-11-05 DIAGNOSIS — Z23 Encounter for immunization: Secondary | ICD-10-CM | POA: Diagnosis not present

## 2017-11-05 NOTE — Progress Notes (Addendum)
Subjective:   Luke Coleman is a 67 y.o. male who presents for an Initial Medicare Annual Wellness Visit.  Review of Systems  No ROS.  Medicare Wellness Visit. Additional risk factors are reflected in the social history.  Cardiac Risk Factors include: advanced age (>60men, >13 women);hypertension;diabetes mellitus;obesity (BMI >30kg/m2);male gender    Objective:    Today's Vitals   11/05/17 1503  BP: 118/70  Pulse: 98  Resp: 14  Temp: 98 F (36.7 C)  TempSrc: Oral  SpO2: 96%  Weight: 216 lb 6.4 oz (98.2 kg)  Height: 5\' 7"  (1.702 m)   Body mass index is 33.89 kg/m.  Advanced Directives 11/05/2017 09/24/2015 09/04/2015 09/04/2015  Does Patient Have a Medical Advance Directive? No No No No  Would patient like information on creating a medical advance directive? Yes (MAU/Ambulatory/Procedural Areas - Information given) No - patient declined information Yes - Spiritual care consult ordered -    Current Medications (verified) Outpatient Encounter Medications as of 11/05/2017  Medication Sig  . Dulaglutide (TRULICITY) 1.5 GL/8.7FI SOPN Inject into the skin.  . fluticasone (FLONASE) 50 MCG/ACT nasal spray Place 2 sprays into both nostrils daily.  . Insulin Glargine-Lixisenatide 100-33 UNT-MCG/ML SOPN Inject into the skin.  Marland Kitchen lisinopril-hydrochlorothiazide (PRINZIDE,ZESTORETIC) 20-12.5 MG tablet Take 1 tablet by mouth daily.  . Multiple Vitamin (MULTIVITAMIN WITH MINERALS) TABS tablet Take 1 tablet by mouth daily.  . ondansetron (ZOFRAN) 4 MG tablet Take 1 tablet (4 mg total) by mouth every 8 (eight) hours as needed for nausea or vomiting.  . [DISCONTINUED] finasteride (PROSCAR) 5 MG tablet take 1 tablet by mouth once daily  . [DISCONTINUED] sulfamethoxazole-trimethoprim (BACTRIM DS) 800-160 MG tablet Take 1 tablet by mouth 2 (two) times daily. 6 weeks duration  . metFORMIN (GLUCOPHAGE-XR) 500 MG 24 hr tablet Take by mouth.   No facility-administered encounter medications on file  as of 11/05/2017.     Allergies (verified) Patient has no known allergies.   History: Past Medical History:  Diagnosis Date  . Acute bacterial sinusitis 08/22/2014  . COPD (chronic obstructive pulmonary disease) ([place])   . Diabetes mellitus without complication ([place])   . Enlarged prostate   . Essential (primary) hypertension 03/01/2013   Overview:  Last Assessment & Plan:  Taking meds as prescribed. Check bmet. Continue to follow.   Marland Kitchen HTN (hypertension) 03/01/2013  . Hypertension   . Sleep apnea    on CPAP   Past Surgical History:  Procedure Laterality Date  . APPENDECTOMY    . CIRCUMCISION  Age 55  . HERNIA REPAIR    . NASAL SEPTUM SURGERY     For deviated septum  . SMALL INTESTINE SURGERY     Adhesions   Family History  Problem Relation Age of Onset  . Early death Mother        MVA - died age 79  . Alcohol abuse Father   . Hypertension Father   . Cancer Father 43       renal cell carcinoma  . Heart disease Father 24       MI - died  . Diabetes Brother   . Sleep apnea Brother   . Sleep apnea Daughter   . Sleep apnea Brother   . Diabetes Maternal Uncle   . Diabetes Paternal Aunt   . Kidney disease Maternal Grandmother    Social History   Socioeconomic History  . Marital status: Married    Spouse name: Robin Searing  . Number of children: 1  . Years  of education: 4  . Highest education level: None  Social Needs  . Financial resource strain: Not hard at all  . Food insecurity - worry: Never true  . Food insecurity - inability: Never true  . Transportation needs - medical: No  . Transportation needs - non-medical: No  Occupational History  . Occupation: Scientist, physiological of Orthoptist    Comment: Media planner  Tobacco Use  . Smoking status: Never Smoker  . Smokeless tobacco: Never Used  Substance and Sexual Activity  . Alcohol use: No    Alcohol/week: 0.0 oz    Comment: Occasional  . Drug use: No  . Sexual activity: Yes    Partners: Female    Other Topics Concern  . None  Social History Narrative   Mr. Crisostomo grew up in [place], [place], [place]. He currently is living in [place], [place] with his wife of 2 months. He was married previously to his second wife for 29 years. Previous to that, he was married to his first wife for 7 years and has 1 daughter from this marriage (34 y/o). He widowed in 2013 (second wife). 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. He obtained a Oceanographer in Scientist, physiological, [employer] from [employer]. 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 He enjoys baseball. Enjoys the outdoors. He also enjoys watching old movies. 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   Tobacco Counseling Counseling given: Not Answered   Clinical Intake:  Pre-visit preparation completed: Yes  Pain : No/denies pain     Nutritional Status: BMI > 30  Obese Diabetes: No  How often do you need to have someone help you when you read instructions, pamphlets, or other written materials from your doctor or pharmacy?: 1 - Never  Interpreter Needed?: No     Activities of Daily Living In your present state of health, do you have any difficulty performing the following activities: 11/05/2017  Hearing? N  Vision? N  Difficulty concentrating or making decisions? N  Walking or climbing stairs? N  Dressing or bathing? N  Doing errands, shopping? N  Preparing Food and eating ? N  Using the Toilet? N  In the past six months, have you accidently leaked urine? N  Do you have problems with loss of bowel control? N  Managing your Medications? N  Managing your Finances? N  Housekeeping or managing your Housekeeping? N  Some recent data might be hidden     Immunizations and Health Maintenance Immunization History  Administered Date(s) Administered  . Influenza Whole 05/24/2012  .  Influenza,inj,Quad PF,6+ Mos 04/23/2014  . Influenza-Unspecified 05/06/2017  . Pneumococcal Conjugate-13 11/05/2017  . Pneumococcal Polysaccharide-23 09/12/2008   Health Maintenance Due  Topic Date Due  . Hepatitis C Screening  September 16, 1950  . TETANUS/TDAP  09/23/1970  . COLONOSCOPY  01/17/2015  . OPHTHALMOLOGY EXAM  05/25/2015  . HEMOGLOBIN A1C  03/03/2016  . FOOT EXAM  03/28/2016  . PNA vac Low Risk Adult (1 of 2 - PCV13) 09/23/2016    Patient Care Team: Burnard Hawthorne, FNP as PCP - General (Family Medicine)  Indicate any recent Medical Services you may have received from other than [facility] providers in the past year (date may be approximate).    Assessment:   This is a routine wellness examination for Daelyn.  The goal of the wellness visit is to  assist the patient how to close the gaps in care and create a preventative care plan for the patient.   The roster of all physicians providing medical care to patient is listed in the Snapshot section of the chart.  Osteoporosis risk reviewed.    Safety issues reviewed; Smoke and carbon monoxide detectors in the home. No firearms or firearms locked in a safe within the home. Wears seatbelts when driving or riding with others. No violence in the home.  They do not have excessive sun exposure.  Discussed the need for sun protection: hats, long sleeves and the use of sunscreen if there is significant sun exposure.  Patient is alert, normal appearance, oriented to person/place/and time. Correctly identified the president of the [place] and recalls of 3/3 words. Performs simple calculations and can read correct time from watch face. Displays appropriate judgement.  No new identified risk were noted.  No failures at ADL's or IADL's.    BMI- discussed the importance of a healthy diet, water intake and the benefits of aerobic exercise. Educational material provided.   24 hour diet recall: Regular diet  Dental- every 6 months.  Eye- Visual  acuity not assessed per patient preference since they have regular follow up with the ophthalmologist.  Wears corrective lenses.  Sleep patterns- Sleep is not resting well due to CPAP not in use.   Request new hose and mask; currently with [facility]. Follow up with PCP.  Prevnar 13 administered R deltoid, tolerated well. Educational material provided.  Colonoscopy declined.   Labs ordered per patient request.  Hearing/Vision screen Hearing Screening Comments: Patient is able to hear conversational tones without difficulty.  No issues reported.   Vision Screening Comments: Followed by [facility]) Legally blind R eye Wears corrective lenses Visual acuity not assessed per patient preference since they have regular follow up with the ophthalmologist  Dietary issues and exercise activities discussed: Current Exercise Habits: Home exercise routine, Type of exercise: walking, Time (Minutes): 20, Frequency (Times/Week): 4, Weekly Exercise (Minutes/Week): 80, Intensity: Mild  Goals    . Increase physical activity     Exercise as tolerated      Depression Screen PHQ 2/9 Scores 11/05/2017 05/13/2017 07/28/2016 09/24/2015  PHQ - 2 Score 0 0 0 0    Fall Risk Fall Risk  11/05/2017 05/13/2017 07/28/2016 09/24/2015 08/28/2015  Falls in the past year? No No No No No    Cognitive Function: MMSE - Mini Mental State Exam 11/05/2017  Orientation to time 5  Orientation to Place 5  Registration 3  Attention/ Calculation 5  Recall 3  Language- name 2 objects 2  Language- repeat 1  Language- follow 3 step command 3  Language- read & follow direction 1  Write a sentence 1  Copy design 1  Total score 30        Screening Tests Health Maintenance  Topic Date Due  . Hepatitis C Screening  05-08-1951  . TETANUS/TDAP  09/23/1970  . COLONOSCOPY  01/17/2015  . OPHTHALMOLOGY EXAM  05/25/2015  . HEMOGLOBIN A1C  03/03/2016  . FOOT EXAM  03/28/2016  . PNA vac Low Risk Adult  (1 of 2 - PCV13) 09/23/2016  . INFLUENZA VACCINE  Completed       Plan:   End of life planning; Advanced aging; Advanced directives discussed.  No HCPOA/Living Will.  Additional information provided to help them start the conversation with family.  Copy of HCPOA/Living Will requested upon completion. Time spent on  this topic is 20 minutes.  I have personally reviewed and noted the following in the patient's chart:   . Medical and social history . Use of alcohol, tobacco or illicit drugs  . Current medications and supplements . Functional ability and status . Nutritional status . Physical activity . Advanced directives . List of other physicians . Hospitalizations, surgeries, and ER visits in previous 12 months . Vitals . Screenings to include cognitive, depression, and falls . Referrals and appointments  In addition, I have reviewed and discussed with patient certain preventive protocols, quality metrics, and best practice recommendations. A written personalized care plan for preventive services as well as general preventive health recommendations were provided to patient.     Varney Biles, LPN   1/59/4585     Agree with plan. [name], NP

## 2017-11-05 NOTE — Patient Instructions (Addendum)
  Luke Coleman , Thank you for taking time to come for your Medicare Wellness Visit. I appreciate your ongoing commitment to your health goals. Please review the following plan we discussed and let me know if I can assist you in the future.   Follow up with [name] FNP as needed.    Bring a copy of your [place] and/or Living Will to be scanned into chart once completed.   Have a great day!  These are the goals we discussed: Goals    . Increase physical activity     Exercise as tolerated       This is a list of the screening recommended for you and due dates:  Health Maintenance  Topic Date Due  .  Hepatitis C: One time screening is recommended by Center for Disease Control  (CDC) for  adults born from [birth year] through 1964.   19-Apr-1951  . Tetanus Vaccine  09/23/1970  . Colon Cancer Screening  01/17/2015  . Eye exam for diabetics  05/25/2015  . Hemoglobin A1C  03/03/2016  . Complete foot exam   03/28/2016  . Pneumonia vaccines (1 of 2 - PCV13) 09/23/2016  . Flu Shot  Completed

## 2017-11-07 LAB — HEPATITIS C ANTIBODY
HEP C AB: NONREACTIVE
SIGNAL TO CUT-OFF: 0.01 (ref <1.00)

## 2017-11-08 LAB — COMPREHENSIVE METABOLIC PANEL
AG Ratio: 1.9 (calc) (ref 1.0–2.5)
ALBUMIN MSPROF: 4.8 g/dL (ref 3.6–5.1)
ALT: 32 U/L (ref 9–46)
AST: 22 U/L (ref 10–35)
Alkaline phosphatase (APISO): 61 U/L (ref 40–115)
BUN / CREAT RATIO: 17 (calc) (ref 6–22)
BUN: 24 mg/dL (ref 7–25)
CHLORIDE: 101 mmol/L (ref 98–110)
CO2: 19 mmol/L — ABNORMAL LOW (ref 20–32)
CREATININE: 1.41 mg/dL — AB (ref 0.70–1.25)
Calcium: 9.3 mg/dL (ref 8.6–10.3)
GLOBULIN: 2.5 g/dL (ref 1.9–3.7)
GLUCOSE: 133 mg/dL — AB (ref 65–99)
POTASSIUM: 4.3 mmol/L (ref 3.5–5.3)
Sodium: 137 mmol/L (ref 135–146)
Total Bilirubin: 0.3 mg/dL (ref 0.2–1.2)
Total Protein: 7.3 g/dL (ref 6.1–8.1)

## 2017-11-08 LAB — CBC WITH DIFFERENTIAL/PLATELET
BASOS PCT: 0.7 %
Basophils Absolute: 62 cells/uL (ref 0–200)
EOS PCT: 2.3 %
Eosinophils Absolute: 202 cells/uL (ref 15–500)
HCT: 44.4 % (ref 38.5–50.0)
Hemoglobin: 16.1 g/dL (ref 13.2–17.1)
Lymphs Abs: 2631 cells/uL (ref 850–3900)
MCH: 32.2 pg (ref 27.0–33.0)
MCHC: 36.3 g/dL — ABNORMAL HIGH (ref 32.0–36.0)
MCV: 88.8 fL (ref 80.0–100.0)
MPV: 11.8 fL (ref 7.5–12.5)
Monocytes Relative: 8.2 %
NEUTROS ABS: 5183 {cells}/uL (ref 1500–7800)
Neutrophils Relative %: 58.9 %
PLATELETS: 190 10*3/uL (ref 140–400)
RBC: 5 10*6/uL (ref 4.20–5.80)
RDW: 13.2 % (ref 11.0–15.0)
TOTAL LYMPHOCYTE: 29.9 %
WBC mixed population: 722 cells/uL (ref 200–950)
WBC: 8.8 10*3/uL (ref 3.8–10.8)

## 2017-11-08 LAB — TSH: TSH: 1 mIU/L (ref 0.40–4.50)

## 2017-11-10 ENCOUNTER — Telehealth: Payer: Self-pay | Admitting: Family

## 2017-11-10 DIAGNOSIS — G4733 Obstructive sleep apnea (adult) (pediatric): Secondary | ICD-10-CM

## 2017-11-10 NOTE — Telephone Encounter (Signed)
Copied from [place] [id number]. Topic: Quick Communication - See Telephone Encounter >> Nov 10, 2017  1:35 PM Bea Graff, NT wrote: CRM for notification. See Telephone encounter for: Pt needing an order for a new mask and hose for his CPAP or for a sleep study.   11/10/17.

## 2017-11-10 NOTE — Telephone Encounter (Signed)
Please advise 

## 2017-11-10 NOTE — Telephone Encounter (Signed)
Please advise. Thank you

## 2017-11-10 NOTE — Telephone Encounter (Signed)
flonase refill Last OV: 05/13/17 with [name] Last Refill:? Original prescription written 07/07/16 by [place]: [facility]

## 2017-11-10 NOTE — Telephone Encounter (Signed)
Do we have to repeat study melissa?

## 2017-11-10 NOTE — Telephone Encounter (Signed)
Copied from [place] [phone]. Topic: Quick Communication - Rx Refill/Question >> Nov 10, 2017  1:33 PM Bea Graff, NT wrote: Medication: fluticasone Asencion Islam)   Has the patient contacted their pharmacy? Yes.     (Agent: If no, request that the patient contact the pharmacy for the refill.)   Preferred Pharmacy (with phone number or street name): [facility] on [address] in [place]: Please be advised that RX refills may take up to 3 business days. We ask that you follow-up with your pharmacy.

## 2017-11-10 NOTE — Telephone Encounter (Signed)
Spoke with patient last sleep study 10 years ago , moved here 5 years ago.   Used previous equipment.   ? Do repeat sleep study.

## 2017-11-11 ENCOUNTER — Other Ambulatory Visit: Payer: Self-pay

## 2017-11-11 ENCOUNTER — Encounter: Payer: Self-pay | Admitting: Family

## 2017-11-11 MED ORDER — FLUTICASONE PROPIONATE 50 MCG/ACT NA SUSP: 2.0000 | Freq: Every day | NASAL | 6 refills | Status: DC

## 2017-11-11 MED ORDER — FLUTICASONE PROPIONATE 50 MCG/ACT NA SUSP
2.0000 | Freq: Every day | NASAL | 6 refills | Status: DC
Start: 2017-11-11 — End: 2017-11-15

## 2017-11-11 NOTE — Telephone Encounter (Signed)
See other message for documentation  on CPAP

## 2017-11-11 NOTE — Telephone Encounter (Signed)
If he has not had a sleep study in 10 yrs then yes he will need a new study. We will also need his baseline study as well if he has never had one here. He will need to get the report from where he had it last.

## 2017-11-11 NOTE — Telephone Encounter (Signed)
Script sent  

## 2017-11-12 NOTE — Telephone Encounter (Signed)
Please call pt and let them know I have reordered sleep study ( see below message from [place]) Does pt have report from baseline he could get for [name]  Melissa, I have re ordered study

## 2017-11-15 MED ORDER — FLUTICASONE PROPIONATE 50 MCG/ACT NA SUSP: 2.0000 | Freq: Every day | NASAL | 6 refills | Status: DC

## 2017-11-15 NOTE — Telephone Encounter (Signed)
Patient notified of Sleep study order ,patient does not have copy of first sleep study but was performed at [facility] in [place] placed medical release at front desk for signature.

## 2017-11-18 NOTE — Telephone Encounter (Signed)
Pt called to inform sleep study was done w/ Dr. Monia Sabal. Luke Coleman [phone]; pt meant to tell his pcp this, he wants pcp to see if he can get a new cpap maching or his equipment fixed, pt would like to get the release form emailed to him so that he can sign it and fax it back, call pt advise

## 2017-11-18 NOTE — Telephone Encounter (Signed)
Left voicemail for patient to call, we can fax medical records release to you.

## 2017-11-19 NOTE — Telephone Encounter (Signed)
Please advise 

## 2017-11-23 ENCOUNTER — Encounter: Payer: Self-pay | Admitting: *Deleted

## 2017-11-23 NOTE — Telephone Encounter (Signed)
[place] message sent.

## 2017-11-24 ENCOUNTER — Encounter: Payer: Self-pay | Admitting: Family

## 2017-11-24 NOTE — Telephone Encounter (Signed)
Luke Coleman,  As discussed, once he signs to get prior study, he will still need supplies.

## 2017-11-26 NOTE — Telephone Encounter (Signed)
He has not come by yet. Still waiting.

## 2017-12-01 NOTE — Telephone Encounter (Signed)
Patient brought by medical records release signed. Called [facility] and left message for medical records requesting fax number. Medical records release located on my desk.in folder for  upcoming appointment

## 2017-12-02 NOTE — Telephone Encounter (Signed)
Medical records release faxed to [facility].   Left voicemail advising patient .

## 2017-12-02 NOTE — Telephone Encounter (Signed)
Copied from [place] [id number]. Topic: Quick Communication - Office Called Patient >> Dec 01, 2017  4:15 PM Clayton Lefort, Patriciaann Clan, CMA wrote: Reason for FUW:TKTC message for [facility] requesting fax number  >> Dec 01, 2017  4:44 PM Bea Graff, NT wrote: [place] [facility] calling pt with their fax number. Fax#: [phone]

## 2017-12-22 ENCOUNTER — Encounter: Payer: Self-pay | Admitting: Family

## 2018-01-07 NOTE — Telephone Encounter (Signed)
No, I have not received any records for him yet.

## 2018-03-01 ENCOUNTER — Encounter: Payer: Self-pay | Admitting: Nurse Practitioner

## 2018-03-01 ENCOUNTER — Ambulatory Visit: Payer: BC Managed Care – PPO | Admitting: Nurse Practitioner

## 2018-03-01 ENCOUNTER — Other Ambulatory Visit: Payer: Self-pay

## 2018-03-01 VITALS — BP 114/71 | HR 87 | Temp 97.9°F | Ht 67.0 in | Wt 213.4 lb

## 2018-03-01 DIAGNOSIS — I10 Essential (primary) hypertension: Secondary | ICD-10-CM

## 2018-03-01 DIAGNOSIS — R1030 Lower abdominal pain, unspecified: Secondary | ICD-10-CM

## 2018-03-01 DIAGNOSIS — Z7689 Persons encountering health services in other specified circumstances: Secondary | ICD-10-CM | POA: Diagnosis not present

## 2018-03-01 DIAGNOSIS — R300 Dysuria: Secondary | ICD-10-CM

## 2018-03-01 LAB — POCT URINALYSIS DIPSTICK
Bilirubin, UA: NEGATIVE
Blood, UA: NEGATIVE
Glucose, UA: POSITIVE — AB
Ketones, UA: NEGATIVE
Leukocytes, UA: NEGATIVE
Nitrite, UA: NEGATIVE
Protein, UA: NEGATIVE
Spec Grav, UA: 1.015 (ref 1.010–1.025)
Urobilinogen, UA: 0.2 E.U./dL
pH, UA: 5 (ref 5.0–8.0)

## 2018-03-01 MED ORDER — LISINOPRIL-HYDROCHLOROTHIAZIDE 20-12.5 MG PO TABS: 1.0000 | ORAL_TABLET | Freq: Every day | ORAL | 1 refills | Status: DC

## 2018-03-01 NOTE — Patient Instructions (Addendum)
Luke Coleman,   Thank you for coming in to clinic today.  1. Take prednisone taper 10 mg tablets Day 1 (Today): Take 6 pills at one time Day 2: Take 6 pills  Day 3: Take 5 pills Day 4: Take 4 pills Day 5: Take 3 pills Day 6: Take 2 pills Day 7: Take 1 pill then stop.  2. Continue eating a bland diet.  Over next 1-3 days, if no improvement please call Clinic.  Your symptoms are consistent with a GI infection or possible diverticulitis.  3. You may need podiatry for your foot if you continue having pain.  Physical therapy is a second option if you need.  4. You may transition back to primary care for diabetes as long as tomorrow's A1c is less than 8.0%  Please schedule a follow-up appointment with [name], AGNP. Return in about 3 months (around 06/01/2018) for diabetes, hypertension.  If you have any other questions or concerns, please feel free to call the clinic or send a message through [place]. You may also schedule an earlier appointment if necessary.  You will receive a survey after today's visit either digitally by e-mail or paper by [employer]. Your experiences and feedback matter to [name].  Please respond so we know how we are doing as we provide care for you.   [name], DNP, AGNP-BC Adult Gerontology Nurse Practitioner [facility], [facility]          Plantar Fascia Stretches / Exercises  See other page with pictures of each exercise.  Start with 1 or 2 of these exercises that you are most comfortable with. Do not do any exercises that cause you significant worsening pain. Some of these may cause some "stretching soreness" but it should go away after you stop the exercise, and get better over time. Gradually increase up to 3-4 exercises as tolerated.  You may begin exercising the muscles of your foot right away by gently stretching them as follows:  Stretching: Towel stretch: Sit on a hard surface with your injured leg stretched out in front  of you. Loop a towel around the ball of your foot and pull the towel toward your body keeping your knee straight. Hold this position for 15 to 30 seconds then relax. Repeat 3 times. When the towel stretch becomes to easy, you may begin doing the standing calf stretch.  Standing calf stretch: Facing a wall, put your hands against the wall at about eye level. Keep the injured leg back, the uninjured leg forward, and the heel of your injured leg on the floor. Turn your injured foot slightly inward (as if you were pigeon-toed) as you slowly lean into the wall until you feel a stretch in the back of your calf. Hold for 15 to 30 seconds. Repeat 3 times. Do this exercise several times each day. When you can stand comfortably on your injured foot, you can begin stretching the bottom of your foot using the plantar fascia stretch.  Plantar fascia stretch: Stand with the ball of your injured foot on a stair. Reach for the bottom step with your heel until you feel a stretch in the arch of your foot. Hold this position for 15 to 30 seconds and then relax. Repeat 3 times. After you have stretched the bottom muscles of your foot, you can begin strengthening the top muscles of your foot.  Frozen can roll: Roll your bare injured foot back and forth from your heel to your mid-arch over  a frozen juice can. Repeat for 3 to 5 minutes. This exercise is particularly helpful if done first thing in the morning.  Towel pickup: With your heel on the ground, pick up a towel with your toes. Release. Repeat 10 to 20 times. When this gets easy, add more resistance by placing a book or small weight on the towel.  Static and dynamic balance exercises: Place a chair next to your non-injured leg and stand upright. (This will provide you with balance if needed.) Stand on your injured foot. Try to raise the arch of your foot while keeping your toes on the floor. Try to maintain this position and balance on your injured side for 30  seconds. This exercise can be made more difficult by doing it on a piece of foam or a pillow, or with your eyes closed.  Stand in the same position as above. Keep your foot in this position and reach forward in front of you with your injured side's hand, allowing your knee to bend. Repeat this 10 times while maintaining the arch height. This exercise can be made more difficult by reaching farther in front of you. Do 2 sets.  Stand in the same position as above. While maintaining your arch height, reach the injured side's hand across your body toward the chair. The farther you reach, the more challenging the exercise. Do 2 sets of 10.  Next, you can begin strengthening the muscles of your foot and lower leg by using elastic tubing.  Strengthening: Resisted dorsiflexion: Sit with your injured leg out straight and your foot facing a doorway. Tie a loop in one end of the tubing. Put your foot through the loop so that the tubing goes around the arch of your foot. Tie a knot in the other end of the tubing and shut the knot in the door. Move backward until there is tension in the tubing. Keeping your knee straight, pull your foot toward your body, stretching the tubing. Slowly return to the starting position. Do 3 sets of 10.  Resisted plantar flexion: Sit with your leg outstretched and loop the middle section of the tubing around the ball of your foot. Hold the ends of the tubing in both hands. Gently press the ball of your foot down and point your toes, stretching the tubing. Return to the starting position. Do 3 sets of 10.  Resisted inversion: Sit with your legs out straight and cross your uninjured leg over your injured ankle. Wrap the tubing around the ball of your injured foot and then loop it around your uninjured foot so that the tubing is anchored there at one end. Hold the other end of the tubing in your hand. Turn your injured foot inward and upward. This will stretch the tubing. Return to the  starting position. Do 3 sets of 10.  Resisted eversion: Sit with both legs stretched out in front of you, with your feet about a shoulder's width apart. Tie a loop in one end of the tubing. Put your injured foot through the loop so that the tubing goes around the arch of that foot and wraps around the outside of the uninjured foot. Hold onto the other end of the tubing with your hand to provide tension. Turn your injured foot up and out. Make sure you keep your uninjured foot still so that it will allow the tubing to stretch as you move your injured foot. Return to the starting position. Do 3 sets of 10.

## 2018-03-01 NOTE — Progress Notes (Signed)
Subjective:    Patient ID: Luke Coleman, male    DOB: [birth year], 67 y.o.   MRN: [id number]  Luke Coleman is a 67 y.o. male presenting on 03/01/2018 for Establish Care and Back Pain (lower back pain, stomach burning, and nauseated x 1 week)   HPI Establish Care New Provider Pt last seen by PCP Dr. Lacinda Axon, 1.5 years ago.  Obtain records from [facility].    General review: T2DM - Dr. Sharrie Rothman --> Patient prefers to have no specialist.   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    Stomach/Back Pain Stomach pain, headache, back pain, nausea today - continuous.  Last night had new stomach and back pain.  Fatigue. - Occasionally has constipation/diarrhea,  - Has taken Tums for heartburn, but no relief.  Past Medical History:  Diagnosis Date  . Acute bacterial sinusitis 08/22/2014  . COPD (chronic obstructive pulmonary disease) ([place])   . Diabetes mellitus without complication ([place])   . Enlarged prostate   . Essential (primary) hypertension 03/01/2013   Overview:  Last Assessment & Plan:  Taking meds as prescribed. Check bmet. Continue to follow.   Marland Kitchen HTN (hypertension) 03/01/2013  . Hypertension   . Sleep apnea    on CPAP   Past Surgical History:  Procedure Laterality Date  . APPENDECTOMY    . CIRCUMCISION  Age 41  . HERNIA REPAIR    . NASAL SEPTUM SURGERY     For deviated septum  . SMALL INTESTINE SURGERY     Adhesions   Social History   Socioeconomic History  . Marital status: Married    Spouse name: Luke Coleman  . Number of children: 1  . Years of education: 53  . Highest education level: Not on file  Occupational History  . Occupation: Scientist, physiological of Orthoptist    Comment: Media planner  Social Needs  . Financial resource strain: Not hard at all  . Food insecurity:    Worry: Never true    Inability: Never true  . Transportation needs:    Medical: No    Non-medical: No  Tobacco Use  . Smoking status: Never  Smoker  . Smokeless tobacco: Never Used  Substance and Sexual Activity  . Alcohol use: No    Alcohol/week: 0.0 oz    Comment: Occasional  . Drug use: No  . Sexual activity: Yes    Partners: Female  Lifestyle  . Physical activity:    Days per week: 0 days    Minutes per session: Not on file  . Stress: Not on file  Relationships  . Social connections:    Talks on phone: Not on file    Gets together: Not on file    Attends religious service: Not on file    Active member of club or organization: Not on file    Attends meetings of clubs or organizations: Not on file    Relationship status: Not on file  . Intimate partner violence:    Fear of current or ex partner: No    Emotionally abused: No    Physically abused: No    Forced sexual activity: No  Other Topics Concern  . Not on file  Social History Narrative   Luke Coleman grew up in [place], [place], [place]. He currently is living in [place], [place] with his wife of 2 months. He was married previously to his second wife for 29 years. Previous to that, he was married to  his first wife for 7 years and has 1 Luke Coleman from this marriage (67 y/o). He widowed in 2013 (second wife). 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. He obtained a Oceanographer in Scientist, physiological, [place] from [employer]. 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 He enjoys baseball. Enjoys the outdoors. He also enjoys watching old movies. 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   Family History  Problem Relation Age of Onset  . Early death Luke Coleman        MVA - died age 42  . Alcohol abuse Luke Coleman   . Hypertension Luke Coleman   . Cancer Luke Coleman 3       renal cell carcinoma  . Heart disease Luke Coleman 79       MI - died  . Diabetes Brother   . Sleep apnea Brother   . Sleep apnea Luke Coleman   . Sleep apnea Brother   . Diabetes Maternal  Uncle   . Diabetes Paternal Aunt   . Kidney disease Maternal Grandmother    Current Outpatient Medications on File Prior to Visit  Medication Sig  . Dulaglutide (TRULICITY) 1.5 JS/2.8BT SOPN Inject into the skin.  Marland Kitchen empagliflozin (JARDIANCE) 25 MG TABS tablet Take by mouth.  . fluticasone (FLONASE) 50 MCG/ACT nasal spray Place 2 sprays into both nostrils daily.  Marland Kitchen glucose blood (ONE TOUCH ULTRA TEST) test strip USE TO TEST THREE TIMES DAILY  . Insulin Glargine-Lixisenatide 100-33 UNT-MCG/ML SOPN Inject into the skin.  . Lancets 28G MISC 3 (three) times daily. Use as instructed. DX: E11.9  . lisinopril-hydrochlorothiazide (PRINZIDE,ZESTORETIC) 20-12.5 MG tablet Take 1 tablet by mouth daily.  . metFORMIN (GLUCOPHAGE-XR) 500 MG 24 hr tablet Take by mouth.   No current facility-administered medications on file prior to visit.     Review of Systems  Respiratory: Negative.   Cardiovascular: Negative.   Gastrointestinal: Positive for abdominal pain and diarrhea.  Musculoskeletal: Positive for arthralgias and back pain.   Per HPI unless specifically indicated above     Objective:    BP 114/71 (BP Location: Right Arm, Patient Position: Sitting, Cuff Size: Normal)   Pulse 87   Temp 97.9 F (36.6 C) (Oral)   Ht 5\' 7"  (1.702 m)   Wt 213 lb 6.4 oz (96.8 kg)   BMI 33.42 kg/m   Wt Readings from Last 3 Encounters:  03/01/18 213 lb 6.4 oz (96.8 kg)  11/05/17 216 lb 6.4 oz (98.2 kg)  05/13/17 217 lb 3.2 oz (98.5 kg)    Physical Exam  Constitutional: He is oriented to person, place, and time. He appears well-developed and well-nourished. No distress.  HENT:  Head: Normocephalic and atraumatic.  Cardiovascular: Normal rate, regular rhythm, S1 normal, S2 normal, normal heart sounds and intact distal pulses.  Pulmonary/Chest: Effort normal and breath sounds normal. No respiratory distress.  Abdominal: Soft. Normal appearance. Bowel sounds are increased. There is no hepatosplenomegaly. There  is generalized tenderness (mild generalized abdominal tenderness with greater RLQ tendernes and greatest LLQ tenderness w/ palpation) and tenderness in the right lower quadrant and left lower quadrant. There is no rigidity, no rebound, no guarding, no CVA tenderness, no tenderness at McBurney's point and negative Murphy's sign. No hernia.  Musculoskeletal:  Achilles swelling - tenderness up to muscle  Neurological: He is alert and oriented to person, place, and time.  Skin: Skin is warm and dry.  Capillary refill takes less than 2 seconds.  Psychiatric: He has a normal mood and affect. His behavior is normal. Judgment and thought content normal.  Vitals reviewed.  Results for orders placed or performed in visit on 03/01/18  POCT Urinalysis Dipstick  Result Value Ref Range   Color, UA dark yellow    Clarity, UA clear    Glucose, UA Positive (A) Negative   Bilirubin, UA neg    Ketones, UA neg    Spec Grav, UA 1.015 1.010 - 1.025   Blood, UA negative    pH, UA 5.0 5.0 - 8.0   Protein, UA Negative Negative   Urobilinogen, UA 0.2 0.2 or 1.0 E.U./dL   Nitrite, UA negative    Leukocytes, UA Negative Negative   Appearance     Odor        Assessment & Plan:   Problem List Items Addressed This Visit      Cardiovascular and Mediastinum   HTN (hypertension) Stable. Needs refill on meds - provided.   Relevant Medications   lisinopril-hydrochlorothiazide (PRINZIDE,ZESTORETIC) 20-12.5 MG tablet    Other Visit Diagnoses    Dysuria    -  Primary Not related to UTI. UA neg.  Stay well hydrated. Follow-up prn   Relevant Orders   POCT Urinalysis Dipstick (Completed)   Encounter to establish care     Previous PCP was at [facility], Dr. Lacinda Axon.  Records are reviewed in [facility].  Past medical, family, and surgical history reviewed w/ patient in clinic.     Lower abdominal pain     Acute lower abdominal pain, Likely gastroenteritis vs diverticulitis.  No current evidence of systemic infection.  Plan: 1.  Eat bland diet, stay well hydrated for 1-3 days. 2. Followup closely if persistent symptoms or worsening.      Meds ordered this encounter  Medications  . lisinopril-hydrochlorothiazide (PRINZIDE,ZESTORETIC) 20-12.5 MG tablet    Sig: Take 1 tablet by mouth daily.    Dispense:  90 tablet    Refill:  1    Order Specific Question:   Supervising Provider    Answer:   Olin Hauser [[id number]]    Follow up plan: Return in about 3 months (around 06/01/2018) for diabetes, hypertension.  [name], DNP, AGPCNP-BC Adult Gerontology Primary Care Nurse Practitioner [facility] 03/01/2018, 2:21 PM

## 2018-03-21 ENCOUNTER — Institutional Professional Consult (permissible substitution): Payer: Self-pay | Admitting: Neurology

## 2018-06-01 ENCOUNTER — Encounter: Payer: Self-pay | Admitting: Nurse Practitioner

## 2018-06-02 ENCOUNTER — Ambulatory Visit: Payer: BC Managed Care – PPO | Admitting: Nurse Practitioner

## 2018-06-02 ENCOUNTER — Other Ambulatory Visit: Payer: Self-pay

## 2018-06-02 ENCOUNTER — Encounter: Payer: Self-pay | Admitting: Nurse Practitioner

## 2018-06-02 VITALS — BP 135/77 | HR 72 | Temp 97.9°F | Ht 67.0 in | Wt 208.6 lb

## 2018-06-02 DIAGNOSIS — G4733 Obstructive sleep apnea (adult) (pediatric): Secondary | ICD-10-CM | POA: Insufficient documentation

## 2018-06-02 DIAGNOSIS — Z23 Encounter for immunization: Secondary | ICD-10-CM

## 2018-06-02 DIAGNOSIS — M25472 Effusion, left ankle: Secondary | ICD-10-CM

## 2018-06-02 DIAGNOSIS — I10 Essential (primary) hypertension: Secondary | ICD-10-CM | POA: Diagnosis not present

## 2018-06-02 DIAGNOSIS — N401 Enlarged prostate with lower urinary tract symptoms: Secondary | ICD-10-CM

## 2018-06-02 DIAGNOSIS — E1165 Type 2 diabetes mellitus with hyperglycemia: Secondary | ICD-10-CM

## 2018-06-02 DIAGNOSIS — R350 Frequency of micturition: Secondary | ICD-10-CM

## 2018-06-02 DIAGNOSIS — M25572 Pain in left ankle and joints of left foot: Secondary | ICD-10-CM

## 2018-06-02 LAB — POCT GLYCOSYLATED HEMOGLOBIN (HGB A1C): Hemoglobin A1C: 6.6 % — AB (ref 4.0–5.6)

## 2018-06-02 MED ORDER — PREDNISONE 10 MG PO TABS: ORAL_TABLET | ORAL | 0 refills | Status: DC

## 2018-06-02 NOTE — Progress Notes (Signed)
Subjective:    Patient ID: Luke Coleman, male    DOB: 02/15/1951, 67 y.o.   MRN: [id number]  Luke Coleman is a 67 y.o. male presenting on 06/02/2018 for Ankle Pain (left ankle pain x 1 yr) and Diabetes   HPI Left ankle pain  Present x 1 year.  Pain is intermittent.  In am is worse.  Swelling remains, but is persistent.  He did break an ankle in HS, but does not remember which one was broken.  Has had significant improvement over last 1 year, but slow progress since.  Continued improvement from last visit.  First noted this injury when increasing exercise intensity with higher treadmill incline.  He noted pain and admits he should have stopped, but kept walking.  He now only has pain after walking long distances or later in the day after standing for extended periods of time.  Diabetes Pt presents today for follow up of Type 2 diabetes mellitus. He is checking fasting am CBG at home with a range of 110-147, 120-130 usually - Current diabetic medications include: Trulicity and Jardiance - He is not currently symptomatic.  - He denies polydipsia, polyphagia, polyuria, headaches, diaphoresis, shakiness, chills, pain, numbness or tingling in extremities and changes in vision.  Has clouds, "dirty vision," and halos at night. - Clinical course has been stable. - He  reports an exercise routine that includes low intensity activity of walking his dog every morning. - His diet is moderate in salt, moderate in fat, and moderate in carbohydrates. - Weight trend: decreasing   PREVENTION: Eye exam current (within one year): no Foot exam current (within one year): no - done today Lipid/ASCVD risk reduction - on statin: no - Patient refuses statin at this time.  Has not taken in past, however. Kidney protection - on ace or arb: yes Recent Labs    06/02/18 0849  HGBA1C 6.6*   Enlarged Prostate Cialis in past once daily for BPH. Requests to consider this again.  Has had nocturia x 4 recently.  Is  not currently wearing CPAP as he has no electrical cord and needs a replacement mask.   Hypertension - He is checking BP at home or outside of clinic.  Readings 148/78 last reading.  Pulse rate is higher. - Current medications: lisinopril-hydrochlorothiazide 20-12.5 mg one tab once daily, tolerating with side effects increased urination - He is not currently symptomatic. - Pt denies headache, lightheadedness, dizziness, changes in vision, chest tightness/pressure, palpitations, leg swelling, sudden loss of speech or loss of consciousness.  Social History   Tobacco Use  . Smoking status: Never Smoker  . Smokeless tobacco: Never Used  Substance Use Topics  . Alcohol use: No    Alcohol/week: 0.0 standard drinks    Comment: Occasional  . Drug use: No    Review of Systems Per HPI unless specifically indicated above     Objective:    BP 135/77 (BP Location: Right Arm, Patient Position: Sitting, Cuff Size: Normal)   Pulse 72   Temp 97.9 F (36.6 C) (Oral)   Ht 5\' 7"  (1.702 m)   Wt 208 lb 9.6 oz (94.6 kg)   BMI 32.67 kg/m   Wt Readings from Last 3 Encounters:  06/02/18 208 lb 9.6 oz (94.6 kg)  03/01/18 213 lb 6.4 oz (96.8 kg)  11/05/17 216 lb 6.4 oz (98.2 kg)    Physical Exam  Constitutional: He is oriented to person, place, and time. He appears well-developed and well-nourished. No distress.  HENT:  Head: Normocephalic and atraumatic.  Neck: Normal range of motion. Neck supple. Carotid bruit is not present.  Cardiovascular: Normal rate, regular rhythm, S1 normal, S2 normal, normal heart sounds and intact distal pulses.  Pulmonary/Chest: Effort normal and breath sounds normal. No respiratory distress.  Abdominal: Soft. Bowel sounds are normal. He exhibits no distension. There is no hepatosplenomegaly. There is no tenderness. No hernia.  Musculoskeletal: He exhibits no edema (pedal).       Right ankle: Normal. He exhibits normal range of motion and no swelling.       Left  ankle: He exhibits swelling (mild swelling medial aspect of ankle anterior to medial malleolus). He exhibits normal range of motion, no ecchymosis, no deformity, no laceration and normal pulse. No tenderness. No lateral malleolus, no medial malleolus, no AITFL, no CF ligament, no posterior TFL, no head of 5th metatarsal and no proximal fibula tenderness found. Achilles tendon exhibits no pain and no defect.  Neurological: He is alert and oriented to person, place, and time.  Skin: Skin is warm and dry. Capillary refill takes less than 2 seconds.  Psychiatric: He has a normal mood and affect. His behavior is normal. Judgment and thought content normal.  Vitals reviewed.  Diabetic Foot Exam - Simple   Simple Foot Form Diabetic Foot exam was performed with the following findings:  Yes 06/02/2018  8:45 AM  Visual Inspection No deformities, no ulcerations, no other skin breakdown bilaterally:  Yes Sensation Testing Intact to touch and monofilament testing bilaterally:  Yes Pulse Check Posterior Tibialis and Dorsalis pulse intact bilaterally:  Yes Comments     Results for orders placed or performed in visit on 06/02/18  POCT glycosylated hemoglobin (Hb A1C)  Result Value Ref Range   Hemoglobin A1C 6.6 (A) 4.0 - 5.6 %   HbA1c POC (<> result, manual entry)     HbA1c, POC (prediabetic range)     HbA1c, POC (controlled diabetic range)        Assessment & Plan:   Problem List Items Addressed This Visit      Cardiovascular and Mediastinum   Essential (primary) hypertension     Respiratory   Obstructive sleep apnea syndrome   Relevant Orders   Ambulatory referral to [facility]     Endocrine   Diabetes mellitus, type 2 ([place]) - Primary    Controlled and stable T2DM with A1c 6.6%.  Patient now no longer follows with Endocrinology as control has been gained.  Continues slow weight loss.  Goal A1c < 7.0%. - Complications - hyperglycemia, likely has cataracts.  Plan:  1. Continue current  therapy: trulicity and jardiance 2. Encourage improved lifestyle: - low carb/low glycemic diet reinforced prior education - Increase physical activity to 30 minutes most days of the week.  Explained that increased physical activity increases body's use of sugar for energy. 3. Check fasting am CBG and bring log to next visit for review 4. Continue ACEi.  Discussed statin.  Patient declines to start.  Would like to seek lipid panel before starting.  Discussed CV risks, pleomorphic effects and patient continues to decline. 5. DM Foot exam done today with normal findings.   and Advised to schedule DM ophtho exam, send record. 6. Follow-up 3 months.      Relevant Orders   POCT glycosylated hemoglobin (Hb A1C) (Completed)   COMPLETE METABOLIC PANEL WITH GFR   Lipid panel    Other Visit Diagnoses    Benign prostatic hyperplasia with urinary frequency  Worsening, but may be associated with less CPAP use for OSA.  Patient is not currently taking medications, but would like to resume Cialis.    Plan: 1. Recommended he return to Urology for evaluation, consideration of Cialis.  Patient declines referral today as he wants to resume use of CPAP. 2. Repeat PSA. 3. Reinforced regular use of CPAP machine to control OSA. 4. Followup prn.   Relevant Orders   PSA   Pain and swelling of left ankle     Likely achilles tendinitis.  Patient with known injury approx 1 year ago, slow improvement/progress since, but continues to improve.  Conservative therapy not fully effective.  Plan: 1. Start prednisone taper over 7 days Day 1-2: 60 mg, Day 3: 50 mg, Day 4: 40 mg; Day 5: 30 mg; Day 6 20 mg; Day 7: 10 mg then stop. 2. Consider referral in future to podiatry or sports medicine. 3. Increase physical activity slowly and as tolerated.   4. Followup prn.  I   Relevant Medications   predniSONE (DELTASONE) 10 MG tablet   Needs flu shot     Pt < age 92.  Needs annual influenza vaccine.  Plan: 1. Administer  high dose fluzone today.    Relevant Orders   Flu vaccine HIGH DOSE PF (Fluzone High dose) (Completed)      Meds ordered this encounter  Medications  . predniSONE (DELTASONE) 10 MG tablet    Sig: Day 1-2 take 6 pills. Day 3 take 5 pills then reduce by 1 pill each day.    Dispense:  27 tablet    Refill:  0    Order Specific Question:   Supervising Provider    Answer:   Olin Hauser [[id number]]    Follow up plan: Return in about 3 months (around 09/02/2018) for diabetes.  [name], DNP, AGPCNP-BC Adult Gerontology Primary Care Nurse Practitioner [facility] 06/02/2018, 8:17 AM

## 2018-06-02 NOTE — Patient Instructions (Addendum)
Luke Coleman,   Thank you for coming in to clinic today.  1. Your provider would like to you have your annual eye exam. Please contact your current eye doctor or here are some good options for you to contact.   [facility]   Address: [address] [place], [place] [address] Phone: [phone]  Website: [email or web address] [address], [place], [place] [address] Phone: [phone] [email or web address]  [facility]  Address: [place], [place], [place] [address] Phone: [phone]   [facility] [address] [place], [place] [address] Phone: [phone]  [facility] Address: [place], [place], [place] [address]  Phone: [phone]  2. No changes to DM or hypertension medications.  3. Call us if you do not hear from [facility].  4. For ankle pain - most likely is achilles tendonitis. - Take prednisone taper 10 mg tablets Day 1 (Friday): Take 6 pills at one time Day 2: Take 6 pills  Day 3: Take 5 pills Day 4: Take 4 pills Day 5: Take 3 pills Day 6: Take 2 pills Day 7: Take 1 pill then stop.  Please schedule a follow-up appointment with [name], AGNP. Return in about 3 months (around 09/02/2018) for diabetes.  If you have any other questions or concerns, please feel free to call the clinic or send a message through [facility]. You may also schedule an earlier appointment if necessary.  You will receive a survey after today's visit either digitally by e-mail or paper by [employer]. Your experiences and feedback matter to [name].  Please respond so we know how we are doing as we provide care for you.   [name], DNP, AGNP-BC Adult Gerontology Nurse Practitioner [facility], [facility]   Achilles Tendinitis Achilles tendinitis is inflammation of the tough, cord-like band that attaches the lower leg muscles to the heel bone (Achilles tendon). This is usually caused by overusing the  tendon and the ankle joint. Achilles tendinitis usually gets better over time with treatment and caring for yourself at home. It can take weeks or months to heal completely. What are the causes? This condition may be caused by:  A sudden increase in exercise or activity, such as running.  Doing the same exercises or activities (such as jumping) over and over.  Not warming up calf muscles before exercising.  Exercising in shoes that are worn out or not made for exercise.  Having arthritis or a bone growth (spur) on the back of the heel bone. This can rub against the tendon and hurt it.  Age-related wear and tear. Tendons become less flexible with age and more likely to be injured.  What are the signs or symptoms? Common symptoms of this condition include:  Pain in the Achilles tendon or in the back of the leg, just above the heel. The pain usually gets worse with exercise.  Stiffness or soreness in the back of the leg, especially in the morning.  Swelling of the skin over the Achilles tendon.  Thickening of the tendon.  Bone spurs at the bottom of the Achilles tendon, near the heel.  Trouble standing on tiptoe.  How is this diagnosed? This condition is diagnosed based on your symptoms and a physical exam. You may have tests, including:  X-rays.  MRI.  How is this treated? The goal of treatment is to relieve symptoms and help your injury heal. Treatment may  include:  Decreasing or stopping activities that caused the tendinitis. This may mean switching to low-impact exercises like biking or swimming.  Icing the injured area.  Doing physical therapy, including strengthening and stretching exercises.  NSAIDs to help relieve pain and swelling.  Using supportive shoes, wraps, heel lifts, or a walking boot (air cast).  Surgery. This may be done if your symptoms do not improve after 6 months.  Using high-energy shock wave impulses to stimulate the healing process  (extracorporeal shock wave therapy). This is rare.  Injection of medicines to help relieve inflammation (corticosteroids). This is rare.  Follow these instructions at home: If you have an air cast:  Wear the cast as told by your health care provider. Remove it only as told by your health care provider.  Loosen the cast if your toes tingle, become numb, or turn cold and blue. Activity  Gradually return to your normal activities once your health care provider approves. Do not do activities that cause pain. ? Consider doing low-impact exercises, like cycling or swimming.  If you have an air cast, ask your health care provider when it is safe for you to drive.  If physical therapy was prescribed, do exercises as told by your health care provider or physical therapist. Managing pain, stiffness, and swelling  Raise (elevate) your foot above the level of your heart while you are sitting or lying down.  Move your toes often to avoid stiffness and to lessen swelling.  If directed, put ice on the injured area: ? Put ice in a plastic bag. ? Place a towel between your skin and the bag. ? Leave the ice on for 20 minutes, 2-3 times a day General instructions  If directed, wrap your foot with an elastic bandage or other wrap. This can help keep your tendon from moving too much while it heals. Your health care provider will show you how to wrap your foot correctly.  Wear supportive shoes or heel lifts only as told by your health care provider.  Take over-the-counter and prescription medicines only as told by your health care provider.  Keep all follow-up visits as told by your health care provider. This is important. Contact a health care provider if:  You have symptoms that gets worse.  You have pain that does not get better with medicine.  You develop new, unexplained symptoms.  You develop warmth and swelling in your foot.  You have a fever. Get help right away if:  You have a  sudden popping sound or sensation in your Achilles tendon followed by severe pain.  You cannot move your toes or foot.  You cannot put any weight on your foot. Summary  Achilles tendinitis is inflammation of the tough, cord-like band that attaches the lower leg muscles to the heel bone (Achilles tendon).  This condition is usually caused by overusing the tendon and the ankle joint. It can also be caused by arthritis or normal aging.  The most common symptoms of this condition include pain, swelling, or stiffness in the Achilles tendon or in the back of the leg.  This condition is usually treated with rest, NSAIDs, and physical therapy. This information is not intended to replace advice given to you by your health care provider. Make sure you discuss any questions you have with your health care provider. Document Released: 05/20/2005 Document Revised: 06/29/2016 Document Reviewed: 06/29/2016 Elsevier Interactive Patient Education  2017 [employer].

## 2018-06-02 NOTE — Assessment & Plan Note (Signed)
Mildly elevated hypertension.  BP goal < 130/80.  Pt is working on lifestyle modifications.  Taking medications tolerating well without side effects. No currently identified complications.  Plan: 1. Continue taking lisinopril-HCTZ without change.  Consider increasing HCTZ if no improvement at next visit. 2. Obtain labs today  3. Encouraged heart healthy diet and increasing exercise to 30 minutes most days of the week. 4. Check BP 1-2 x per week at home, keep log, and bring to clinic at next appointment. 5. Follow up 3 months.

## 2018-06-02 NOTE — Assessment & Plan Note (Signed)
Not controlled.  Patient has not been using CPAP.  Needs some replacement supplies.  [facility].

## 2018-06-02 NOTE — Assessment & Plan Note (Signed)
Controlled and stable T2DM with A1c 6.6%.  Patient now no longer follows with Endocrinology as control has been gained.  Continues slow weight loss.  Goal A1c < 7.0%. - Complications - hyperglycemia, likely has cataracts.  Plan:  1. Continue current therapy: trulicity and jardiance 2. Encourage improved lifestyle: - low carb/low glycemic diet reinforced prior education - Increase physical activity to 30 minutes most days of the week.  Explained that increased physical activity increases body's use of sugar for energy. 3. Check fasting am CBG and bring log to next visit for review 4. Continue ACEi.  Discussed statin.  Patient declines to start.  Would like to seek lipid panel before starting.  Discussed CV risks, pleomorphic effects and patient continues to decline. 5. DM Foot exam done today with normal findings.   and Advised to schedule DM ophtho exam, send record. 6. Follow-up 3 months.

## 2018-06-03 LAB — COMPLETE METABOLIC PANEL WITH GFR
AG Ratio: 1.9 (calc) (ref 1.0–2.5)
ALT: 31 U/L (ref 9–46)
AST: 18 U/L (ref 10–35)
Albumin: 4.8 g/dL (ref 3.6–5.1)
Alkaline phosphatase (APISO): 56 U/L (ref 40–115)
BUN/Creatinine Ratio: 18 (calc) (ref 6–22)
BUN: 23 mg/dL (ref 7–25)
CO2: 25 mmol/L (ref 20–32)
Calcium: 9.5 mg/dL (ref 8.6–10.3)
Chloride: 103 mmol/L (ref 98–110)
Creat: 1.3 mg/dL — ABNORMAL HIGH (ref 0.70–1.25)
GFR, Est African American: 66 mL/min/{1.73_m2} (ref >=60)
GFR, Est Non African American: 57 mL/min/{1.73_m2} — ABNORMAL LOW (ref >=60)
Globulin: 2.5 g/dL (calc) (ref 1.9–3.7)
Glucose, Bld: 120 mg/dL — ABNORMAL HIGH (ref 65–99)
Potassium: 4.5 mmol/L (ref 3.5–5.3)
Sodium: 138 mmol/L (ref 135–146)
Total Bilirubin: 0.7 mg/dL (ref 0.2–1.2)
Total Protein: 7.3 g/dL (ref 6.1–8.1)

## 2018-06-03 LAB — PSA: PSA: 1.4 ng/mL (ref <=4.0)

## 2018-06-03 LAB — LIPID PANEL
Cholesterol: 134 mg/dL (ref <200)
HDL: 34 mg/dL — ABNORMAL LOW (ref >40)
LDL Cholesterol (Calc): 74 mg/dL (calc)
Non-HDL Cholesterol (Calc): 100 mg/dL (calc) (ref <130)
Total CHOL/HDL Ratio: 3.9 (calc) (ref <5.0)
Triglycerides: 157 mg/dL — ABNORMAL HIGH (ref <150)

## 2018-06-09 ENCOUNTER — Ambulatory Visit: Payer: BC Managed Care – PPO | Admitting: Nurse Practitioner

## 2018-06-28 ENCOUNTER — Other Ambulatory Visit: Payer: Self-pay

## 2018-06-28 ENCOUNTER — Ambulatory Visit (INDEPENDENT_AMBULATORY_CARE_PROVIDER_SITE_OTHER): Payer: BC Managed Care – PPO | Admitting: Nurse Practitioner

## 2018-06-28 ENCOUNTER — Encounter: Payer: Self-pay | Admitting: Nurse Practitioner

## 2018-06-28 VITALS — BP 135/85 | HR 72 | Temp 98.0°F | Ht 67.0 in | Wt 211.0 lb

## 2018-06-28 DIAGNOSIS — M7989 Other specified soft tissue disorders: Secondary | ICD-10-CM | POA: Diagnosis not present

## 2018-06-28 DIAGNOSIS — L03031 Cellulitis of right toe: Secondary | ICD-10-CM | POA: Diagnosis not present

## 2018-06-28 DIAGNOSIS — E1165 Type 2 diabetes mellitus with hyperglycemia: Secondary | ICD-10-CM | POA: Diagnosis not present

## 2018-06-28 DIAGNOSIS — M79674 Pain in right toe(s): Secondary | ICD-10-CM

## 2018-06-28 MED ORDER — BASAGLAR KWIKPEN 100 UNIT/ML ~~LOC~~ SOPN: 26.0000 [IU] | PEN_INJECTOR | Freq: Every day | SUBCUTANEOUS | 2 refills | Status: DC

## 2018-06-28 MED ORDER — SULFAMETHOXAZOLE-TRIMETHOPRIM 800-160 MG PO TABS: 1.0000 | ORAL_TABLET | Freq: Two times a day (BID) | ORAL | 0 refills | Status: AC

## 2018-06-28 MED ORDER — CEFTRIAXONE SODIUM 1 G IJ SOLR
1.0000 g | Freq: Once | INTRAMUSCULAR | Status: AC
  Administered 2018-06-28: 1 g via INTRAMUSCULAR

## 2018-06-28 NOTE — Progress Notes (Signed)
Subjective:    Patient ID: Luke Coleman, male    DOB: 01-Apr-1951, 67 y.o.   MRN: [id number]  Luke Coleman is a 67 y.o. male presenting on 06/28/2018 for Gout (Right great toe x 2 weeks. Painful to the touch and to put pressure on the that area. Pt currently taking 600 MG  Ibuprofen twice daily. ) and Nausea (x 2 weeks)   HPI R great toe Patient presents with right great toe pain and swelling x2 weeks.  He states it is painful to the touch and painful to put manual pressure on his foot.  He denies any previous episodes of gout.  Swelling is of PIP joint RIGHT great toe.  He states it is not painful to walk. - He is also had associated nausea x 2 weeks -Patient has diabetes and reports blood sugars higher since toe swelling.  Were previously around 40 and now has been 150-170 in the mornings.  Social History   Tobacco Use  . Smoking status: Never Smoker  . Smokeless tobacco: Never Used  Substance Use Topics  . Alcohol use: No    Alcohol/week: 0.0 standard drinks    Comment: Occasional  . Drug use: No    Review of Systems Per HPI unless specifically indicated above     Objective:    BP 135/85   Pulse 72   Temp 98 F (36.7 C) (Oral)   Ht 5\' 7"  (1.702 m)   Wt 211 lb (95.7 kg)   BMI 33.05 kg/m   Wt Readings from Last 3 Encounters:  06/28/18 211 lb (95.7 kg)  06/02/18 208 lb 9.6 oz (94.6 kg)  03/01/18 213 lb 6.4 oz (96.8 kg)    Physical Exam  Constitutional: He is oriented to person, place, and time. He appears well-developed and well-nourished. No distress.  HENT:  Head: Normocephalic and atraumatic.  Cardiovascular: Normal rate, regular rhythm, S1 normal, S2 normal, normal heart sounds and intact distal pulses.  Pulmonary/Chest: Effort normal and breath sounds normal. No respiratory distress.  Abdominal: Soft. Bowel sounds are normal. He exhibits no distension. There is no tenderness.  Musculoskeletal:       Right foot: There is tenderness and swelling.        Feet:  Neurological: He is alert and oriented to person, place, and time.  Skin: Skin is warm and dry.  Psychiatric: He has a normal mood and affect. His behavior is normal.  Vitals reviewed.    Results for orders placed or performed in visit on 06/02/18  COMPLETE METABOLIC PANEL WITH GFR  Result Value Ref Range   Glucose, Bld 120 (H) 65 - 99 mg/dL   BUN 23 7 - 25 mg/dL   Creat 1.30 (H) 0.70 - 1.25 mg/dL   GFR, Est Non African American 57 (L) > OR = 60 mL/min/1.52m2   GFR, Est African American 66 > OR = 60 mL/min/1.47m2   BUN/Creatinine Ratio 18 6 - 22 (calc)   Sodium 138 135 - 146 mmol/L   Potassium 4.5 3.5 - 5.3 mmol/L   Chloride 103 98 - 110 mmol/L   CO2 25 20 - 32 mmol/L   Calcium 9.5 8.6 - 10.3 mg/dL   Total Protein 7.3 6.1 - 8.1 g/dL   Albumin 4.8 3.6 - 5.1 g/dL   Globulin 2.5 1.9 - 3.7 g/dL (calc)   AG Ratio 1.9 1.0 - 2.5 (calc)   Total Bilirubin 0.7 0.2 - 1.2 mg/dL   Alkaline phosphatase (APISO) 56 40 - 115 U/L  AST 18 10 - 35 U/L   ALT 31 9 - 46 U/L  Lipid panel  Result Value Ref Range   Cholesterol 134 <200 mg/dL   HDL 34 (L) >40 mg/dL   Triglycerides 157 (H) <150 mg/dL   LDL Cholesterol (Calc) 74 mg/dL (calc)   Total CHOL/HDL Ratio 3.9 <5.0 (calc)   Non-HDL Cholesterol (Calc) 100 <130 mg/dL (calc)  PSA  Result Value Ref Range   PSA 1.4 < OR = 4.0 ng/mL  POCT glycosylated hemoglobin (Hb A1C)  Result Value Ref Range   Hemoglobin A1C 6.6 (A) 4.0 - 5.6 %   HbA1c POC (<> result, manual entry)     HbA1c, POC (prediabetic range)     HbA1c, POC (controlled diabetic range)        Assessment & Plan:   Problem List Items Addressed This Visit      Endocrine   Diabetes mellitus, type 2 ([place]) Patient is taking Jardiance and 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  Given likely cellulitis of right great toe, will stop Jardiance.  Plan: 1.  Start Jardiance 2.  Start Basaglar 26 units once daily increase by 2 units every 3 days of  blood sugars remain greater than 130. 3.  Follow-up 7 to 10 days if symptoms worsen and after improvement of foot pain to consider return to [place].   Relevant Medications   Insulin Glargine (BASAGLAR KWIKPEN) 100 UNIT/ML SOPN    Other Visit Diagnoses    Cellulitis of toe of right foot    -  Primary Acute cellulitis of right great toe possible association with pain swelling and erythema to gout.  However, symptoms of nausea and increased blood sugars more consistent with infection.  Plan: 1.  Administer 1g IM Rocephin in clinic.  Patient requested IM injection of antibiotics and cephalosporins are helpful for skin infections.  Discussed is not preferred antibiotic. 2. START Bactrin 800-160 mg tab.  Take 1 tab twice daily for 10 days. 3. Reviewed signs and symptoms of complications of wound.  Patient aware of when to seek additional treatment. 4. Follow-up 7-10 days if no improvement or worsening symptoms.   Relevant Medications   cefTRIAXone (ROCEPHIN) injection 1 g (Completed)   sulfamethoxazole-trimethoprim (BACTRIM DS,SEPTRA DS) 800-160 MG tablet   Other Relevant Orders   CBC with Differential/Platelet (Completed)   Pain and swelling of toe, right       Relevant Orders   Uric acid (Completed)         Meds ordered this encounter  Medications  . cefTRIAXone (ROCEPHIN) injection 1 g  . sulfamethoxazole-trimethoprim (BACTRIM DS,SEPTRA DS) 800-160 MG tablet    Sig: Take 1 tablet by mouth 2 (two) times daily for 10 days.    Dispense:  20 tablet    Refill:  0    Order Specific Question:   Supervising Provider    Answer:   Olin Hauser [[id number]]  . Insulin Glargine (BASAGLAR KWIKPEN) 100 UNIT/ML SOPN    Sig: Inject 0.26 mLs (26 Units total) into the skin daily. Increase by 2 units every 3 days if fasting sugar is > 130 to max of 40 units daily.    Dispense:  4 pen    Refill:  2    Order Specific Question:   Supervising Provider    Answer:   Olin Hauser  [[id number]]    Follow up plan: Return 7-10 days if symptoms worsen or fail to improve and as  scheduled.  [name], DNP, AGPCNP-BC Adult Gerontology Primary Care Nurse Practitioner [facility] 06/28/2018, 10:13 AM

## 2018-06-28 NOTE — Patient Instructions (Addendum)
Luke Coleman,   Thank you for coming in to clinic today.  1. STOP Jardiance because of likely toe infection. May be gout - will find out with labs (uric acid).  2. START Basaglar 26 units once daily around the same time of day.  Every 3 days if CBG is greater than 130, increase by 2 units up to max of 40 units daily.  Then, call clinic if not at goals.  3. Continue Trulicity  4. Labs today.  5. For suspected cellulitis, you received 1g Rocephin injection today. - START Bactrim 800-160 mg one tablet every 12 hours for 10 days.  - Watch for any worsening infection (increased redness, swelling, drainage, or open sores.   - Continue pushing skin fold away from nail to prevent ingrown toenail.   - Call clinic if not improving or worsening at any time.  Please schedule a follow-up appointment with [name], AGNP. Return 7-10 days if symptoms worsen or fail to improve and as scheduled.  If you have any other questions or concerns, please feel free to call the clinic or send a message through [place]. You may also schedule an earlier appointment if necessary.  You will receive a survey after today's visit either digitally by e-mail or paper by [employer]. Your experiences and feedback matter to [name].  Please respond so we know how we are doing as we provide care for you.  [name], DNP, AGNP-BC Adult Gerontology Nurse Practitioner [facility], [facility]   Cellulitis, Adult Cellulitis is a skin infection. The infected area is usually red and tender. This condition occurs most often in the arms and lower legs. The infection can travel to the muscles, blood, and underlying tissue and become serious. It is very important to get treated for this condition. What are the causes? Cellulitis is caused by bacteria. The bacteria enter through a break in the skin, such as a cut, burn, insect bite, open sore, or crack. What increases the risk? This condition is more likely to occur  in people who:  Have a weak defense system (immune system).  Have open wounds on the skin such as cuts, burns, bites, and scrapes. Bacteria can enter the body through these open wounds.  Are older.  Have diabetes.  Have a type of long-lasting (chronic) liver disease (cirrhosis) or kidney disease.  Use IV drugs.  What are the signs or symptoms? Symptoms of this condition include:  Redness, streaking, or spotting on the skin.  Swollen area of the skin.  Tenderness or pain when an area of the skin is touched.  Warm skin.  Fever.  Chills.  Blisters.  How is this diagnosed? This condition is diagnosed based on a medical history and physical exam. You may also have tests, including:  Blood tests.  Lab tests.  Imaging tests.  How is this treated? Treatment for this condition may include:  Medicines, such as antibiotic medicines or antihistamines.  Supportive care, such as rest and application of cold or warm cloths (cold or warm compresses) to the skin.  Hospital care, if the condition is severe.  The infection usually gets better within 1-2 days of treatment. Follow these instructions at home:  Take over-the-counter and prescription medicines only as told by your health care provider.  If you were prescribed an antibiotic medicine, take it as told by your health care provider. Do not stop taking the antibiotic even if you start to feel better.  Drink enough fluid to keep your  urine clear or pale yellow.  Do not touch or rub the infected area.  Raise (elevate) the infected area above the level of your heart while you are sitting or lying down.  Apply warm or cold compresses to the affected area as told by your health care provider.  Keep all follow-up visits as told by your health care provider. This is important. These visits let your health care provider make sure a more serious infection is not developing. Contact a health care provider if:  You have a  fever.  Your symptoms do not improve within 1-2 days of starting treatment.  Your bone or joint underneath the infected area becomes painful after the skin has healed.  Your infection returns in the same area or another area.  You notice a swollen bump in the infected area.  You develop new symptoms.  You have a general ill feeling (malaise) with muscle aches and pains. Get help right away if:  Your symptoms get worse.  You feel very sleepy.  You develop vomiting or diarrhea that persists.  You notice red streaks coming from the infected area.  Your red area gets larger or turns dark in color. This information is not intended to replace advice given to you by your health care provider. Make sure you discuss any questions you have with your health care provider. Document Released: 05/20/2005 Document Revised: 12/19/2015 Document Reviewed: 06/19/2015 Elsevier Interactive Patient Education  [employer].

## 2018-06-29 LAB — URIC ACID: Uric Acid, Serum: 5.2 mg/dL (ref 4.0–8.0)

## 2018-06-29 LAB — CBC WITH DIFFERENTIAL/PLATELET
Basophils Absolute: 28 cells/uL (ref 0–200)
Basophils Relative: 0.4 %
Eosinophils Absolute: 168 cells/uL (ref 15–500)
Eosinophils Relative: 2.4 %
HCT: 44.7 % (ref 38.5–50.0)
Hemoglobin: 15.4 g/dL (ref 13.2–17.1)
Lymphs Abs: 1792 cells/uL (ref 850–3900)
MCH: 31.9 pg (ref 27.0–33.0)
MCHC: 34.5 g/dL (ref 32.0–36.0)
MCV: 92.5 fL (ref 80.0–100.0)
MPV: 11.4 fL (ref 7.5–12.5)
Monocytes Relative: 7 %
Neutro Abs: 4522 cells/uL (ref 1500–7800)
Neutrophils Relative %: 64.6 %
Platelets: 177 10*3/uL (ref 140–400)
RBC: 4.83 10*6/uL (ref 4.20–5.80)
RDW: 13.6 % (ref 11.0–15.0)
Total Lymphocyte: 25.6 %
WBC mixed population: 490 cells/uL (ref 200–950)
WBC: 7 10*3/uL (ref 3.8–10.8)

## 2018-07-01 ENCOUNTER — Encounter: Payer: Self-pay | Admitting: Nurse Practitioner

## 2018-07-04 ENCOUNTER — Encounter: Payer: Self-pay | Admitting: Nurse Practitioner

## 2018-07-04 ENCOUNTER — Ambulatory Visit: Payer: BC Managed Care – PPO | Admitting: Nurse Practitioner

## 2018-07-04 ENCOUNTER — Other Ambulatory Visit: Payer: Self-pay

## 2018-07-04 VITALS — BP 114/69 | HR 85 | Temp 97.8°F | Ht 67.0 in | Wt 209.1 lb

## 2018-07-04 DIAGNOSIS — R21 Rash and other nonspecific skin eruption: Secondary | ICD-10-CM | POA: Diagnosis not present

## 2018-07-04 DIAGNOSIS — N481 Balanitis: Secondary | ICD-10-CM

## 2018-07-04 DIAGNOSIS — L03031 Cellulitis of right toe: Secondary | ICD-10-CM | POA: Diagnosis not present

## 2018-07-04 MED ORDER — CEPHALEXIN 500 MG PO CAPS: 500.0000 mg | ORAL_CAPSULE | Freq: Two times a day (BID) | ORAL | 0 refills | Status: AC

## 2018-07-04 MED ORDER — CLINDAMYCIN PHOSPHATE 1 % EX GEL: Freq: Two times a day (BID) | CUTANEOUS | 0 refills | Status: DC

## 2018-07-04 NOTE — Patient Instructions (Addendum)
Luke Coleman,   Thank you for coming in to clinic today.  1. Balanitis or Fournier's gangrene are likely causes. - Balanitis is most likely. - Start clindamycin topical twice daily for 7 days. - STOP Bactrim and change to Keflex.   Take Keflex 500 mg twice daily for 10 days.   Please schedule a follow-up appointment with [name], AGNP. Return 5-7 days if symptoms worsen or fail to improve.  If you have any other questions or concerns, please feel free to call the clinic or send a message through [place]. You may also schedule an earlier appointment if necessary.  You will receive a survey after today's visit either digitally by e-mail or paper by [employer]. Your experiences and feedback matter to [name].  Please respond so we know how we are doing as we provide care for you.   [name], DNP, AGNP-BC Adult Gerontology Nurse Practitioner [facility], [facility]    Balanitis Balanitis is swelling and irritation (inflammation) of the head of the penis (glans penis). The condition may also cause inflammation of the skin around the glans penis (foreskin) in men who have not been circumcised. It may develop because of an infection or another medical condition. Balanitis occurs most often among men who have not had their foreskin removed (uncircumcised men). Balanitis sometimes causes scarring of the penis or foreskin, which can require surgery. Untreated balanitis can increase the risk of penile cancer. What are the causes? Common causes of this condition include:  Poor personal hygiene, especially in uncircumcised men. Not cleaning the glans penis and foreskin well can result in buildup of bacteria, viruses, and yeast, which can lead to infection and inflammation.  Irritation and lack of air flow due to fluid (smegma) that can build up on the glans penis.  Other causes include:  Chemical irritation from products such as soaps or shower gels (especially those that have  fragrance), condoms, personal lubricants, petroleum jelly, spermicides, or fabric softeners.  Skin conditions, such as eczema, dermatitis, and psoriasis.  Allergies to medicines, such as tetracycline and sulfa drugs.  Certain medical conditions, including liver cirrhosis, congestive heart failure, diabetes, and kidney disease.  Infections, such as candidiasis, HPV (human papillomavirus), herpes simplex, gonorrhea, and syphilis.  Severe obesity.  What increases the risk? The following factors may make you more likely to develop this condition:  Having diabetes. This is the most common risk factor.  Having a tight foreskin that is difficult to pull back (retract) past the glans.  Having sexual intercourse without using a condom.  What are the signs or symptoms? Symptoms of this condition include:  Discharge from under the foreskin.  A bad smell.  Pain or difficulty retracting the foreskin.  Tenderness, redness, and swelling of the glans.  A rash or sores on the glans or foreskin.  Itchiness.  Inability to get an erection due to pain.  Difficulty urinating.  Scarring of the penis or foreskin, in some cases.  How is this diagnosed? This condition may be diagnosed based on:  A physical exam.  Testing a swab of discharge to check for bacterial or fungal infection.  Blood tests: ? To check for viruses that can cause balanitis. ? To check your blood sugar (glucose) level. High blood glucose could be a sign of diabetes, which can cause balanitis.  How is this treated? Treatment for balanitis depends on the cause. Treatment may include:  Improving personal hygiene. Your health care provider may recommend sitting in a bath  of warm water that is deep enough to cover your hips and buttocks (sitz bath).  Medicines such as: ? Creams or ointments to reduce swelling (steroids) or to treat an infection. ? Antibiotic medicine. ? Antifungal medicine.  Surgery to remove or  cut the foreskin (circumcision). This may be done if you have scarring on the foreskin that makes it difficult to retract.  Controlling other medical problems that may be causing your condition or making it worse.  Follow these instructions at home:  Do not have sex until the condition clears up, or until your health care provider approves.  Keep your penis clean and dry. Take sitz baths as recommended by your health care provider.  Avoid products that irritate your skin or make symptoms worse, such as soaps and shower gels that have fragrance.  Take over-the-counter and prescription medicines only as told by your health care provider. ? If you were prescribed an antibiotic medicine or a cream or ointment, use it as told by your health care provider. Do not stop using your medicine, cream, or ointment even if you start to feel better. ? Do not drive or use heavy machinery while taking prescription pain medicine. Contact a health care provider if:  Your symptoms get worse or do not improve with home care.  You develop chills or a fever.  You have trouble urinating.  You cannot retract your foreskin. Get help right away if:  You develop severe pain.  You are unable to urinate. Summary  Balanitis is inflammation of the head of the penis (glans penis) caused by irritation or infection.  Balanitis causes pain, redness, and swelling of the glans penis.  This condition is most common among uncircumcised men who do not keep their glans penis clean and in men who have diabetes.  Treatment may include creams or ointments.  Good hygiene is important for prevention. This includes pulling back the foreskin when washing your penis. This information is not intended to replace advice given to you by your health care provider. Make sure you discuss any questions you have with your health care provider. Document Released: 12/27/2008 Document Revised: 06/29/2016 Document Reviewed:  06/29/2016 Elsevier Interactive Patient Education  2017 [employer].

## 2018-07-04 NOTE — Progress Notes (Signed)
Subjective:    Patient ID: Luke Coleman, male    DOB: Feb 22, 1951, 68 y.o.   MRN: [id number]  Luke Coleman is a 67 y.o. male presenting on 07/04/2018 for Penile blister (pt notice some discomfort w/ penis. Now develop blisters and redness at the head of his penis x 3 days )   HPI Penile lesion Burning and redness of head of penis.   - Used monistat without relief.   - Clothes are painful to touching. - Rash is progressing and worsening "quickly." - Patient denies any recent sexual activity.  Only in mutually monogamous relationship and no concerns for contracting any new STI.  - New Bactrim on 06/28/2018 - no Rash any other location of the body.  Toe is improving with less redness and pain. - Blood sugars are steady since stopping Jardiance.  No involvement of scrotum.  Social History   Tobacco Use  . Smoking status: Never Smoker  . Smokeless tobacco: Never Used  Substance Use Topics  . Alcohol use: No    Alcohol/week: 0.0 standard drinks    Comment: Occasional  . Drug use: No    Review of Systems Per HPI unless specifically indicated above     Objective:    BP 114/69 (BP Location: Right Arm, Patient Position: Sitting, Cuff Size: Large)   Pulse 85   Temp 97.8 F (36.6 C) (Oral)   Ht 5\' 7"  (1.702 m)   Wt 209 lb 1.6 oz (94.8 kg)   BMI 32.75 kg/m   Wt Readings from Last 3 Encounters:  07/04/18 209 lb 1.6 oz (94.8 kg)  06/28/18 211 lb (95.7 kg)  06/02/18 208 lb 9.6 oz (94.6 kg)    Physical Exam  Constitutional: He is oriented to person, place, and time. He appears well-developed and well-nourished. No distress.  HENT:  Head: Normocephalic and atraumatic.  Abdominal: Hernia confirmed negative in the right inguinal area and confirmed negative in the left inguinal area.  Genitourinary: Penile erythema and penile tenderness present. No discharge found.     Lymphadenopathy: No inguinal adenopathy noted on the right or left side.  Neurological: He is alert and  oriented to person, place, and time.  Skin: Skin is warm and dry. Capillary refill takes less than 2 seconds.  Psychiatric: He has a normal mood and affect. His behavior is normal. Judgment and thought content normal.  Vitals reviewed.   Results for orders placed or performed in visit on 06/28/18  Uric acid  Result Value Ref Range   Uric Acid, Serum 5.2 4.0 - 8.0 mg/dL  CBC with Differential/Platelet  Result Value Ref Range   WBC 7.0 3.8 - 10.8 Thousand/uL   RBC 4.83 4.20 - 5.80 Million/uL   Hemoglobin 15.4 13.2 - 17.1 g/dL   HCT 44.7 38.5 - 50.0 %   MCV 92.5 80.0 - 100.0 fL   MCH 31.9 27.0 - 33.0 pg   MCHC 34.5 32.0 - 36.0 g/dL   RDW 13.6 11.0 - 15.0 %   Platelets 177 140 - 400 Thousand/uL   MPV 11.4 7.5 - 12.5 fL   Neutro Abs 4,522 1,500 - 7,800 cells/uL   Lymphs Abs 1,792 850 - 3,900 cells/uL   WBC mixed population 490 200 - 950 cells/uL   Eosinophils Absolute 168 15 - 500 cells/uL   Basophils Absolute 28 0 - 200 cells/uL   Neutrophils Relative % 64.6 %   Total Lymphocyte 25.6 %   Monocytes Relative 7.0 %   Eosinophils Relative 2.4 %  Basophils Relative 0.4 %      Assessment & Plan:   Problem List Items Addressed This Visit    None    Visit Diagnoses    Penile rash    -  Primary   Relevant Orders   Ambulatory referral to Urology   Balanitis       Relevant Medications   clindamycin (CLINDAGEL) 1 % gel   cephALEXin (KEFLEX) 500 MG capsule   Cellulitis of toe of right foot       Relevant Medications   cephALEXin (KEFLEX) 500 MG capsule    # Balanitis Patient with likely acute reaction to sulfa antibiotics.  Cannot exclude fournier's gangrene with history of Jardiance use, but is not consistent with exam and no scrotal involvement with history of sulfa use. - No current concern for STI given lifestyle.  Plan: 1. Treat with clindamycin topically. 2. Transition from sulfa to Keflex to complete SSTI treatment 3. Continue off Jardiance for possible side  effects. 4. Referral urology for evaluation.  Meds ordered this encounter  Medications  . clindamycin (CLINDAGEL) 1 % gel    Sig: Apply topically 2 (two) times daily.    Dispense:  30 g    Refill:  0    Order Specific Question:   Supervising Provider    Answer:   Olin Hauser [[id number]]  . cephALEXin (KEFLEX) 500 MG capsule    Sig: Take 1 capsule (500 mg total) by mouth 2 (two) times daily for 10 days.    Dispense:  20 capsule    Refill:  0    Order Specific Question:   Supervising Provider    Answer:   Olin Hauser [[id number]]    Follow up plan: Return 5-7 days if symptoms worsen or fail to improve.  [name], DNP, AGPCNP-BC Adult Gerontology Primary Care Nurse Practitioner [facility] 07/04/2018, 11:49 AM

## 2018-07-05 ENCOUNTER — Encounter: Payer: Self-pay | Admitting: Urology

## 2018-07-05 ENCOUNTER — Ambulatory Visit: Payer: BC Managed Care – PPO | Admitting: Urology

## 2018-07-05 VITALS — BP 133/82 | HR 99 | Ht 67.0 in | Wt 211.6 lb

## 2018-07-05 DIAGNOSIS — L271 Localized skin eruption due to drugs and medicaments taken internally: Secondary | ICD-10-CM

## 2018-07-05 MED ORDER — TRIAMCINOLONE ACETONIDE 0.025 % EX OINT: 1.0000 "application " | TOPICAL_OINTMENT | Freq: Two times a day (BID) | CUTANEOUS | 0 refills | Status: DC

## 2018-07-05 NOTE — Progress Notes (Signed)
3:37 PM  07/05/18   Luke Coleman 19-Sep-1950 [id number]  Referring provider: [name], NP [place], [place] [id number]  CC: voiding issues  HPI: Patient is a 67 year old Caucasian male who presents today for a penile rash with his wife, Luke Coleman.    He states that he had the sudden onset of pain associated with a rash on the head of penis one week ago.  The pain is intense and worsened by contact.  He has not noted anything that has helped the pain.   He denies any injury to his penis.  He has no urinary issues associated with the rash except pain when the urine comes in contact with the skin on the head of the penis.  He has not had any penile discharge.  Patient denies any gross hematuria, dysuria or suprapubic/flank pain.  Patient denies any fevers, chills, nausea or vomiting.   He had an episode of gout in his right toe recently that was treated with Rocephin and Bactrim.  He is a diabetic and was switched to Insulin Glargine from his Jardiance.    He was last seen by Luke Coleman in our office in 05/2016: 67 yo M with BPH, ED  who returns today 4 recheck of a post void residual into reassess urinary symptoms. Last week, he presented to our clinic with acute worsening of his urinary symptoms after taking cold medication.   He has since stopped his Flomax and is restarted his Cialis. He continues to take finasteride.   Urine culture negative last week.   Today, he is doing a little bit better but continues to have urinary frequency and nocturia.   He does feel that he is emptying his bladder little bit better than last week.   He reports that  he has had an extremely dry mouth and has been drinking tones of water over the past few days.  He underwent cystoscopy in 05/2015  which showed enlarged median lobe.  His PVR was previously 51 cc.  Uroflow with peak flow 5.0 mL/s without obstructed pattern.   He was previously started on finasteride in October 2015 as well as Cialis 5  mg daily which worked well ( has issues with cost of medication).  He does have a remote history of TURP 20 year ago in [place].    He reported an increase in frequency to his PCP recently and would like that addressed once the penile rash has been addressed.    PMH: Past Medical History:  Diagnosis Date  . Acute bacterial sinusitis 08/22/2014  . COPD (chronic obstructive pulmonary disease) ([place])   . Diabetes mellitus without complication ([place])   . Enlarged prostate   . Essential (primary) hypertension 03/01/2013   Overview:  Last Assessment & Plan:  Taking meds as prescribed. Check bmet. Continue to follow.   Marland Kitchen HTN (hypertension) 03/01/2013  . Hypertension   . Sleep apnea    on CPAP    Surgical History: Past Surgical History:  Procedure Laterality Date  . APPENDECTOMY    . CIRCUMCISION  Age 34  . HERNIA REPAIR    . NASAL SEPTUM SURGERY     For deviated septum  . SMALL INTESTINE SURGERY     Adhesions    Home Medications:  Allergies as of 07/05/2018   No Known Allergies     Medication List        Accurate as of 07/05/18  3:37 PM. Always use your most  recent med list.          BASAGLAR KWIKPEN 100 UNIT/ML Sopn Inject 0.26 mLs (26 Units total) into the skin daily. Increase by 2 units every 3 days if fasting sugar is > 130 to max of 40 units daily.   cephALEXin 500 MG capsule Commonly known as:  KEFLEX Take 1 capsule (500 mg total) by mouth 2 (two) times daily for 10 days.   clindamycin 1 % gel Commonly known as:  CLINDAGEL Apply topically 2 (two) times daily.   fluticasone 50 MCG/ACT nasal spray Commonly known as:  FLONASE Place 2 sprays into both nostrils daily.   Lancets 28G Misc 3 (three) times daily. Use as instructed. DX: E11.9   lisinopril-hydrochlorothiazide 20-12.5 MG tablet Commonly known as:  PRINZIDE,ZESTORETIC Take 1 tablet by mouth daily.   ONE TOUCH ULTRA TEST test strip Generic drug:  glucose blood USE TO TEST THREE TIMES DAILY     sulfamethoxazole-trimethoprim 800-160 MG tablet Commonly known as:  BACTRIM DS,SEPTRA DS Take 1 tablet by mouth 2 (two) times daily for 10 days.   TRULICITY 1.5 KK/9.3GH Sopn Generic drug:  Dulaglutide Inject into the skin.       Allergies: No Known Allergies  Family History: Family History  Problem Relation Age of Onset  . Early death Mother        MVA - died age 28  . Alcohol abuse Father   . Hypertension Father   . Cancer Father 89       renal cell carcinoma  . Heart disease Father 65       MI - died  . Diabetes Brother   . Sleep apnea Brother   . Sleep apnea Daughter   . Sleep apnea Brother   . Diabetes Maternal Uncle   . Diabetes Paternal Aunt   . Kidney disease Maternal Grandmother     Social History:  reports that he has never smoked. He has never used smokeless tobacco. He reports that he does not drink alcohol or use drugs.  Physical Exam: BP 133/82 (BP Location: Left Arm, Patient Position: Sitting, Cuff Size: Large)   Pulse 99   Ht 5\' 7"  (1.702 m)   Wt 211 lb 9.6 oz (96 kg)   BMI 33.14 kg/m   Constitutional: Well nourished. Alert and oriented, No acute distress. HEENT: [place] AT, moist mucus membranes. Trachea midline, no masses. Cardiovascular: No clubbing, cyanosis, or edema. Respiratory: Normal respiratory effort, no increased work of breathing. GI: Abdomen is soft, non tender, non distended, no abdominal masses. Liver and spleen not palpable.  No hernias appreciated.  Stool sample for occult testing is not indicated.   GU: No CVA tenderness.  No bladder fullness or masses.  Patient with circumcised phallus.  Urethral meatus is patent.  No penile discharge. Glans of penis is dry.  A well demarcated area of erythema is present on the ventral surface of the glans that extends to the right lateral coronal ridge.  Head of penis is exquisitely tender.  Scrotum without lesions, cysts, rashes and/or edema.  Testicles are located scrotally bilaterally. No masses  are appreciated in the testicles. Left and right epididymis are normal. Rectal: Not performed.   Skin: No rashes, bruises or suspicious lesions. Lymph: No cervical or inguinal adenopathy. Neurologic: Grossly intact, no focal deficits, moving all 4 extremities. Psychiatric: Normal mood and affect.  Laboratory Data:  Lab Results  Component Value Date   CREATININE 1.30 (H) 06/02/2018    Lab Results  Component Value  Date   PSA 1.4 06/02/2018   PSA 0.47 05/13/2017    Lab Results  Component Value Date   HGBA1C 6.6 (A) 06/02/2018    Results for orders placed or performed in visit on 06/28/18  Uric acid  Result Value Ref Range   Uric Acid, Serum 5.2 4.0 - 8.0 mg/dL  CBC with Differential/Platelet  Result Value Ref Range   WBC 7.0 3.8 - 10.8 Thousand/uL   RBC 4.83 4.20 - 5.80 Million/uL   Hemoglobin 15.4 13.2 - 17.1 g/dL   HCT 44.7 38.5 - 50.0 %   MCV 92.5 80.0 - 100.0 fL   MCH 31.9 27.0 - 33.0 pg   MCHC 34.5 32.0 - 36.0 g/dL   RDW 13.6 11.0 - 15.0 %   Platelets 177 140 - 400 Thousand/uL   MPV 11.4 7.5 - 12.5 fL   Neutro Abs 4,522 1,500 - 7,800 cells/uL   Lymphs Abs 1,792 850 - 3,900 cells/uL   WBC mixed population 490 200 - 950 cells/uL   Eosinophils Absolute 168 15 - 500 cells/uL   Basophils Absolute 28 0 - 200 cells/uL   Neutrophils Relative % 64.6 %   Total Lymphocyte 25.6 %   Monocytes Relative 7.0 %   Eosinophils Relative 2.4 %   Basophils Relative 0.4 %    1. Painful penile rash Explained to the patient that I have seen something similar with another patient after taking an antibiotic and believe it is a fixed drug eruption.  He recently had been on NSAIDS and Bactrim which are medications with a history of causing fixed drug eruptions.  I would like him to be evaluated by dermatology tomorrow for conformation.  I have advised him not to start the Keflex and Clindamycin gel at this time as that would only worsen his condition further if it is a fixed drug eruption.   I have sent in a steroid cream for him to use topically and advised him to take OTC Benadryl.  I will contact him tomorrow after his dermatology appointment.     2. BPH (benign prostatic hyperplasia) Address on return  3. ED Address on return  4. Incomplete bladder emptying  Address on return  [address], [address] [place], [place] [address] [phone]

## 2018-07-06 ENCOUNTER — Other Ambulatory Visit: Payer: Self-pay | Admitting: Urology

## 2018-07-06 MED ORDER — TADALAFIL 5 MG PO TABS: 5.0000 mg | ORAL_TABLET | Freq: Every day | ORAL | 0 refills | Status: DC | PRN

## 2018-07-06 NOTE — Progress Notes (Unsigned)
Please call Luke Coleman and have him follow up in one month for I PSS and PVR with me.

## 2018-07-07 ENCOUNTER — Telehealth: Payer: Self-pay

## 2018-07-07 NOTE — Telephone Encounter (Signed)
Prior authorization for tadalafil 5mg  done via covermymeds.  Waiting response.

## 2018-07-11 ENCOUNTER — Encounter: Payer: Self-pay | Admitting: Nurse Practitioner

## 2018-07-11 ENCOUNTER — Telehealth: Payer: Self-pay

## 2018-07-11 NOTE — Telephone Encounter (Signed)
Prior authorization for tadalafil was denied.

## 2018-07-22 ENCOUNTER — Encounter: Payer: Self-pay | Admitting: Nurse Practitioner

## 2018-07-22 DIAGNOSIS — E1165 Type 2 diabetes mellitus with hyperglycemia: Secondary | ICD-10-CM

## 2018-07-26 MED ORDER — GLUCOSE BLOOD VI STRP: ORAL_STRIP | 11 refills | Status: DC

## 2018-07-26 MED ORDER — INSULIN PEN NEEDLE 32G X 4 MM MISC: 1.0000 | Freq: Every day | 3 refills | Status: DC

## 2018-07-27 ENCOUNTER — Telehealth: Payer: Self-pay | Admitting: Nurse Practitioner

## 2018-07-27 NOTE — Telephone Encounter (Signed)
Left message for patient to call back  

## 2018-07-27 NOTE — Telephone Encounter (Signed)
Pt thinks he has the flu and there are no appts available today.  He asked if something could be called in [phone]

## 2018-07-27 NOTE — Telephone Encounter (Signed)
unfortunately we have no opening today advised patient to go to urgent care or do E visit but patient rather wants med's to be send advised patient that provider needs to evaluate first before sending the flu medication. He is willing to schedule an appointment tomorrow and also aware to go to urgent care or ER if his Sxs gets worst.

## 2018-07-27 NOTE — Telephone Encounter (Signed)
Reviewed.  Agree with advice given.  Considering recent abx allergy, will not want to treat with any abx without being seen.

## 2018-07-28 ENCOUNTER — Ambulatory Visit
Admission: RE | Admit: 2018-07-28 | Discharge: 2018-07-28 | Disposition: A | Discharge status: Home / Self Care | Payer: BC Managed Care – PPO | Source: Ambulatory Visit | Attending: Family Medicine | Admitting: Family Medicine

## 2018-07-28 ENCOUNTER — Ambulatory Visit (INDEPENDENT_AMBULATORY_CARE_PROVIDER_SITE_OTHER): Payer: BC Managed Care – PPO | Admitting: Family Medicine

## 2018-07-28 ENCOUNTER — Inpatient Hospital Stay: Admit: 2018-07-28 | Payer: Self-pay

## 2018-07-28 ENCOUNTER — Encounter: Payer: Self-pay | Admitting: Family Medicine

## 2018-07-28 VITALS — BP 132/74 | HR 95 | Temp 98.4°F | Resp 16 | Ht 67.0 in | Wt 214.0 lb

## 2018-07-28 DIAGNOSIS — Z8709 Personal history of other diseases of the respiratory system: Secondary | ICD-10-CM | POA: Insufficient documentation

## 2018-07-28 DIAGNOSIS — J4521 Mild intermittent asthma with (acute) exacerbation: Secondary | ICD-10-CM | POA: Diagnosis not present

## 2018-07-28 DIAGNOSIS — R6889 Other general symptoms and signs: Secondary | ICD-10-CM

## 2018-07-28 LAB — POCT INFLUENZA A/B
Influenza A, POC: NEGATIVE
Influenza B, POC: NEGATIVE

## 2018-07-28 MED ORDER — ALBUTEROL SULFATE (2.5 MG/3ML) 0.083% IN NEBU: 2.5000 mg | INHALATION_SOLUTION | Freq: Four times a day (QID) | RESPIRATORY_TRACT | 1 refills | Status: DC | PRN

## 2018-07-28 MED ORDER — IPRATROPIUM BROMIDE 0.06 % NA SOLN: 2.0000 | Freq: Four times a day (QID) | NASAL | 0 refills | Status: DC

## 2018-07-28 MED ORDER — PREDNISONE 50 MG PO TABS: 50.0000 mg | ORAL_TABLET | Freq: Every day | ORAL | 0 refills | Status: DC

## 2018-07-28 NOTE — Progress Notes (Signed)
Subjective:    Patient ID: Luke Coleman, male    DOB: 09-18-1950, 67 y.o.   MRN: [id number]  Rashied Corallo is a 67 y.o. male presenting on 07/28/2018 for Chills (bodyache, sinus pain, ear pain, chest congestion, HA onset 3 days patient had a flu shot)  Patient presents for a same day appointment.  PCP is [name], AGPCNP-BC  HPI   Flu-like symptoms / COUGH / SINUSITUS Reports symptoms started 3 days ago with acute onset sinus congestion and pain with ear pain, also associated with body aches and some chills without documented fever. He felt warm sometimes as well subjective fever. He took Tylenol cold and flu as needed with temporary relief. - he has history of possible COPD vs Asthma in past, he has a nebulizer machine at home but no medicine for it, has not had this problem in a while several years. He used to work in [employer] and this caused it more often - He uses Flonase regularly. History of allergies - Admits initially he had a productive cough with darker colored sputum that has improved - Admits some initial headache - Denies any chest pain or tightness, rash, nausea vomiting abdominal pain   Health Maintenance: UTD Flu Vaccine 06/02/18  Depression screen [facility] 2/9 07/28/2018 11/05/2017 05/13/2017  Decreased Interest 0 0 0  Down, Depressed, Hopeless 0 0 0  PHQ - 2 Score 0 0 0    Social History   Tobacco Use  . Smoking status: Never Smoker  . Smokeless tobacco: Never Used  Substance Use Topics  . Alcohol use: No    Alcohol/week: 0.0 standard drinks    Comment: Occasional  . Drug use: No    Review of Systems Per HPI unless specifically indicated above     Objective:    BP 132/74   Pulse 95   Temp 98.4 F (36.9 C) (Oral)   Resp 16   Ht 5\' 7"  (1.702 m)   Wt 214 lb (97.1 kg)   SpO2 97%   BMI 33.52 kg/m   Wt Readings from Last 3 Encounters:  07/28/18 214 lb (97.1 kg)  07/05/18 211 lb 9.6 oz (96 kg)  07/04/18 209 lb 1.6 oz (94.8 kg)    Physical  Exam  Constitutional: He is oriented to person, place, and time. He appears well-developed and well-nourished. No distress.  Mildly tired appearing, comfortable, cooperative  HENT:  Head: Normocephalic and atraumatic.  Mouth/Throat: Oropharynx is clear and moist.  Frontal / maxillary sinuses non-tender. Nares patent with congestion without  purulence. Bilateral TMs with some possible mild effusion slightly opaque without bulging or erythema.   Oropharynx with post nasal drainage without erythema, exudates, edema or asymmetry.  Eyes: Conjunctivae are normal. Right eye exhibits no discharge. Left eye exhibits no discharge.  Neck: Normal range of motion. Neck supple.  Cardiovascular: Normal rate, regular rhythm, normal heart sounds and intact distal pulses.  No murmur heard. Pulmonary/Chest: Effort normal. No respiratory distress. He has no wheezes. He has no rales.  Coarse breath sounds bilateral lung bases, non focal. Frequent coughing  Musculoskeletal: Normal range of motion. He exhibits no edema.  Lymphadenopathy:    He has no cervical adenopathy.  Neurological: He is alert and oriented to person, place, and time.  Skin: Skin is warm and dry. No rash noted. He is not diaphoretic. No erythema.  Psychiatric: He has a normal mood and affect. His behavior is normal.  Well groomed, good eye contact, normal speech and thoughts  Nursing note and vitals reviewed.  I have personally reviewed the radiology report from 07/28/18 STAT CXR.  CLINICAL DATA:  Productive cough since 07/25/2018.  EXAM: CHEST - 2 VIEW  COMPARISON:  PA and lateral chest 09/04/2015.  FINDINGS: The lungs are clear. Heart size is normal. No pneumothorax or pleural effusion. No bony abnormality.  IMPRESSION: No acute disease.   Electronically Signed   By: Inge Rise M.D.   On: 07/28/2018 10:28  Results for orders placed or performed in visit on 07/28/18  POCT Influenza A/B  Result Value Ref Range     Influenza A, POC Negative Negative   Influenza B, POC Negative Negative      Assessment & Plan:   Problem List Items Addressed This Visit    None    Visit Diagnoses    Mild intermittent asthma with acute exacerbation    -  Primary   Relevant Medications   albuterol (PROVENTIL) (2.5 MG/3ML) 0.083% nebulizer solution   predniSONE (DELTASONE) 50 MG tablet   Flu-like symptoms       Relevant Medications   ipratropium (ATROVENT) 0.06 % nasal spray   Other Relevant Orders   POCT Influenza A/B (Completed)   DG Chest 2 View (Completed)     Consistent with mild acute asthma exacerbation in setting of mild persistent asthma infrequent flare, 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 No flu contact - Wheezing bronchospasm on exam No recent prior exacerbation  Plan: Rapid Flu test today - NEGATIVE, clinical suspicion is less, given history of asthma now and >72 hours, elect to not empirically treat with negative test  STAT CXR today - reviewed results above, no focal infection or pneumonia, called patient after visit to review  Start Prednisone burst 50mg  daily x 5 days Refill albuterol for nebulizer at home - use q 4 hr PRN for now 3-5 days then PRN Start Atrovent nasal spray decongestant 2 sprays in each nostril up to 4 times daily for 7 days Return criteria given to follow-up vs when to go to ED  Reconsider antibiotic therapy next week if not improved or worsening sign of infection  Meds ordered this encounter  Medications  . ipratropium (ATROVENT) 0.06 % nasal spray    Sig: Place 2 sprays into both nostrils 4 (four) times daily. For up to 5-7 days then stop.    Dispense:  15 mL    Refill:  0  . albuterol (PROVENTIL) (2.5 MG/3ML) 0.083% nebulizer solution    Sig: Take 3 mLs (2.5 mg total) by nebulization every 6 (six) hours as needed for wheezing or shortness of  breath.    Dispense:  150 mL    Refill:  1  . predniSONE (DELTASONE) 50 MG tablet    Sig: Take 1 tablet (50 mg total) by mouth daily with breakfast.    Dispense:  5 tablet    Refill:  0    Follow up plan: Return in about 1 week (around 08/04/2018), or if symptoms worsen or fail to improve, for bronchitis / flu like.  Nobie Putnam, [facility] 07/28/2018, 9:16 AM

## 2018-07-28 NOTE — Patient Instructions (Addendum)
Thank you for coming to the office today.  1. It sounds like you had an Upper Respiratory Virus that has settled into a Bronchitis, lower respiratory tract infection. I don't have concerns for pneumonia today, and think that this should gradually improve. Once you are feeling better, the cough may take a few weeks to fully resolve. I do hear wheezing and coarse breath sounds, this may be due to the virus  Chest x-ray today - stay tuned for results  Flu test was negative, still not always guaranteed to be 100%  Most likely will treat Asthma vs COPD with nebulizer every 4 hours as needed for next few days.  Start Atrovent nasal spray decongestant 2 sprays in each nostril up to 4 times daily for 7 days  Continue Flonase  May try OTC Mucinex (plain without DM or sinus) up to 7-10 days then stop  - Use nasal saline (Simply Saline or Ocean Spray) to flush nasal congestion multiple times a day, may help cough - Drink plenty of fluids to improve congestion  If your symptoms seem to worsen instead of improve over next several days, including significant fever / chills, worsening shortness of breath, worsening wheezing, or nausea / vomiting and can't take medicines - return sooner or go to hospital Emergency Department for more immediate treatment.   Please schedule a Follow-up Appointment to: Return in about 1 week (around 08/04/2018), or if symptoms worsen or fail to improve, for bronchitis / flu like.  If you have any other questions or concerns, please feel free to call the office or send a message through [place]. You may also schedule an earlier appointment if necessary.  Additionally, you may be receiving a survey about your experience at our office within a few days to 1 week by e-mail or mail. We value your feedback.  Nobie Putnam, DO [place]

## 2018-07-31 DIAGNOSIS — J0100 Acute maxillary sinusitis, unspecified: Secondary | ICD-10-CM

## 2018-08-01 MED ORDER — AMOXICILLIN-POT CLAVULANATE 875-125 MG PO TABS: 1.0000 | ORAL_TABLET | Freq: Two times a day (BID) | ORAL | 0 refills | Status: DC

## 2018-08-01 NOTE — Telephone Encounter (Signed)
Mychart message

## 2018-08-01 NOTE — Addendum Note (Signed)
Addended by: Olin Hauser on: 08/01/2018 05:11 PM   Modules accepted: Orders

## 2018-08-03 ENCOUNTER — Ambulatory Visit: Payer: BC Managed Care – PPO | Admitting: Urology

## 2018-08-05 ENCOUNTER — Other Ambulatory Visit: Payer: Self-pay

## 2018-08-09 LAB — HM DIABETES EYE EXAM

## 2018-08-10 ENCOUNTER — Encounter: Payer: Self-pay | Admitting: Family Medicine

## 2018-08-11 ENCOUNTER — Other Ambulatory Visit: Payer: Self-pay | Admitting: Family Medicine

## 2018-08-11 DIAGNOSIS — R6889 Other general symptoms and signs: Secondary | ICD-10-CM

## 2018-08-15 ENCOUNTER — Telehealth: Payer: Self-pay | Admitting: Urology

## 2018-08-15 NOTE — Telephone Encounter (Signed)
Luke Coleman needs to have a follow up appointment regarding his penile rash and his urinary symptoms.

## 2018-08-20 ENCOUNTER — Other Ambulatory Visit: Payer: Self-pay | Admitting: Family Medicine

## 2018-08-20 DIAGNOSIS — R6889 Other general symptoms and signs: Secondary | ICD-10-CM

## 2018-08-22 ENCOUNTER — Other Ambulatory Visit: Payer: Self-pay | Admitting: Nurse Practitioner

## 2018-08-22 DIAGNOSIS — I10 Essential (primary) hypertension: Secondary | ICD-10-CM

## 2018-08-28 ENCOUNTER — Other Ambulatory Visit: Payer: Self-pay | Admitting: Family

## 2018-09-05 ENCOUNTER — Ambulatory Visit: Payer: BC Managed Care – PPO | Admitting: Nurse Practitioner

## 2018-09-27 ENCOUNTER — Ambulatory Visit: Payer: BC Managed Care – PPO | Admitting: Urology

## 2018-10-17 ENCOUNTER — Encounter: Payer: Self-pay | Admitting: *Deleted

## 2018-10-26 ENCOUNTER — Encounter: Payer: Self-pay | Admitting: Emergency Medicine

## 2018-10-26 ENCOUNTER — Encounter
Admission: RE | Disposition: A | Discharge status: Home / Self Care | Payer: Self-pay | Source: Home / Self Care | Attending: Ophthalmology

## 2018-10-26 ENCOUNTER — Ambulatory Visit
Admission: RE | Admit: 2018-10-26 | Discharge: 2018-10-26 | Disposition: A | Discharge status: Home / Self Care | Payer: BC Managed Care – PPO | Attending: Ophthalmology | Admitting: Ophthalmology

## 2018-10-26 ENCOUNTER — Ambulatory Visit: Payer: BC Managed Care – PPO | Admitting: Anesthesiology

## 2018-10-26 ENCOUNTER — Other Ambulatory Visit: Payer: Self-pay

## 2018-10-26 DIAGNOSIS — J449 Chronic obstructive pulmonary disease, unspecified: Secondary | ICD-10-CM | POA: Insufficient documentation

## 2018-10-26 DIAGNOSIS — Z7984 Long term (current) use of oral hypoglycemic drugs: Secondary | ICD-10-CM | POA: Insufficient documentation

## 2018-10-26 DIAGNOSIS — I10 Essential (primary) hypertension: Secondary | ICD-10-CM | POA: Diagnosis not present

## 2018-10-26 DIAGNOSIS — Z79899 Other long term (current) drug therapy: Secondary | ICD-10-CM | POA: Insufficient documentation

## 2018-10-26 DIAGNOSIS — H2512 Age-related nuclear cataract, left eye: Secondary | ICD-10-CM | POA: Insufficient documentation

## 2018-10-26 DIAGNOSIS — G4730 Sleep apnea, unspecified: Secondary | ICD-10-CM | POA: Diagnosis not present

## 2018-10-26 HISTORY — PX: CATARACT EXTRACTION W/PHACO: SHX586

## 2018-10-26 LAB — GLUCOSE, CAPILLARY: GLUCOSE-CAPILLARY: 107 mg/dL — AB (ref 70–99)

## 2018-10-26 SURGERY — PHACOEMULSIFICATION, CATARACT, WITH IOL INSERTION
Anesthesia: Monitor Anesthesia Care | Site: Eye | Laterality: Left

## 2018-10-26 MED ORDER — SODIUM CHLORIDE 0.9 % IV SOLN: INTRAVENOUS | Status: DC

## 2018-10-26 MED ORDER — SODIUM HYALURONATE 23 MG/ML IO SOLN
INTRAOCULAR | Status: DC | PRN
  Administered 2018-10-26: 0.6 mL via INTRAOCULAR

## 2018-10-26 MED ORDER — LIDOCAINE HCL (PF) 4 % IJ SOLN
INTRAOCULAR | Status: DC | PRN
  Administered 2018-10-26: 4 mL via OPHTHALMIC

## 2018-10-26 MED ORDER — MOXIFLOXACIN HCL 0.5 % OP SOLN
OPHTHALMIC | Status: AC
  Filled 2018-10-26: qty 3

## 2018-10-26 MED ORDER — POVIDONE-IODINE 5 % OP SOLN
OPHTHALMIC | Status: DC | PRN
  Administered 2018-10-26: 1 via OPHTHALMIC

## 2018-10-26 MED ORDER — TETRACAINE HCL 0.5 % OP SOLN
OPHTHALMIC | Status: AC
  Administered 2018-10-26: 1 [drp] via OPHTHALMIC
  Filled 2018-10-26: qty 4

## 2018-10-26 MED ORDER — MIDAZOLAM HCL 2 MG/2ML IJ SOLN
INTRAMUSCULAR | Status: AC
  Filled 2018-10-26: qty 2

## 2018-10-26 MED ORDER — EPINEPHRINE PF 1 MG/ML IJ SOLN
INTRAOCULAR | Status: DC | PRN
  Administered 2018-10-26: 13:00:00 via OPHTHALMIC

## 2018-10-26 MED ORDER — MOXIFLOXACIN HCL 0.5 % OP SOLN
OPHTHALMIC | Status: DC | PRN
  Administered 2018-10-26: 0.2 mL via OPHTHALMIC

## 2018-10-26 MED ORDER — TETRACAINE HCL 0.5 % OP SOLN
1.0000 [drp] | OPHTHALMIC | Status: DC | PRN
  Administered 2018-10-26: 1 [drp] via OPHTHALMIC

## 2018-10-26 MED ORDER — ARMC OPHTHALMIC DILATING DROPS
1.0000 "application " | OPHTHALMIC | Status: AC
  Administered 2018-10-26 (×2): 1 via OPHTHALMIC

## 2018-10-26 MED ORDER — POVIDONE-IODINE 5 % OP SOLN
OPHTHALMIC | Status: AC
  Filled 2018-10-26: qty 30

## 2018-10-26 MED ORDER — MOXIFLOXACIN HCL 0.5 % OP SOLN: 1.0000 [drp] | OPHTHALMIC | Status: DC | PRN

## 2018-10-26 MED ORDER — MIDAZOLAM HCL 2 MG/2ML IJ SOLN
INTRAMUSCULAR | Status: DC | PRN
  Administered 2018-10-26: 1 mg via INTRAVENOUS
  Administered 2018-10-26: 0.5 mg via INTRAVENOUS
  Administered 2018-10-26: 1 mg via INTRAVENOUS
  Administered 2018-10-26: 0.5 mg via INTRAVENOUS
  Administered 2018-10-26: 1 mg via INTRAVENOUS

## 2018-10-26 MED ORDER — EPINEPHRINE PF 1 MG/ML IJ SOLN
INTRAMUSCULAR | Status: AC
  Filled 2018-10-26: qty 2

## 2018-10-26 MED ORDER — SODIUM HYALURONATE 23 MG/ML IO SOLN
INTRAOCULAR | Status: AC
  Filled 2018-10-26: qty 0.6

## 2018-10-26 MED ORDER — SODIUM HYALURONATE 10 MG/ML IO SOLN
INTRAOCULAR | Status: DC | PRN
  Administered 2018-10-26: 0.55 mL via INTRAOCULAR

## 2018-10-26 MED ORDER — FENTANYL CITRATE (PF) 100 MCG/2ML IJ SOLN
INTRAMUSCULAR | Status: AC
  Filled 2018-10-26: qty 2

## 2018-10-26 MED ORDER — CARBACHOL 0.01 % IO SOLN
INTRAOCULAR | Status: DC | PRN
  Administered 2018-10-26: 0.5 mL via INTRAOCULAR

## 2018-10-26 MED ORDER — LIDOCAINE HCL (PF) 4 % IJ SOLN
INTRAMUSCULAR | Status: AC
  Filled 2018-10-26: qty 5

## 2018-10-26 MED ORDER — FENTANYL CITRATE (PF) 100 MCG/2ML IJ SOLN
INTRAMUSCULAR | Status: DC | PRN
  Administered 2018-10-26: 50 ug via INTRAVENOUS
  Administered 2018-10-26 (×2): 25 ug via INTRAVENOUS

## 2018-10-26 SURGICAL SUPPLY — 16 items
DISSECTOR HYDRO NUCLEUS 50X22 (MISCELLANEOUS) ×3 IMPLANT
GLOVE BIO SURGEON STRL SZ8 (GLOVE) ×3 IMPLANT
GLOVE BIOGEL M 6.5 STRL (GLOVE) ×3 IMPLANT
GLOVE SURG LX 7.5 STRW (GLOVE) ×2
GLOVE SURG LX STRL 7.5 STRW (GLOVE) ×1 IMPLANT
GOWN STRL REUS W/ TWL LRG LVL3 (GOWN DISPOSABLE) ×2 IMPLANT
GOWN STRL REUS W/TWL LRG LVL3 (GOWN DISPOSABLE) ×4
LABEL CATARACT MEDS ST (LABEL) ×3 IMPLANT
LENS IOL TECNIS ITEC 25.0 (Intraocular Lens) ×3 IMPLANT
PACK CATARACT (MISCELLANEOUS) ×3 IMPLANT
PACK CATARACT KING (MISCELLANEOUS) ×3 IMPLANT
PACK EYE AFTER SURG (MISCELLANEOUS) ×3 IMPLANT
SOL BSS BAG (MISCELLANEOUS) ×3
SOLUTION BSS BAG (MISCELLANEOUS) ×1 IMPLANT
WATER STERILE IRR 250ML POUR (IV SOLUTION) ×3 IMPLANT
WIPE NON LINTING 3.25X3.25 (MISCELLANEOUS) ×3 IMPLANT

## 2018-10-26 NOTE — Anesthesia Postprocedure Evaluation (Signed)
Anesthesia Post Note  Patient: Luke Coleman  Procedure(s) Performed: CATARACT EXTRACTION PHACO AND INTRAOCULAR LENS PLACEMENT ([place]) LEFT (Left Eye)  Patient location during evaluation: Phase II Anesthesia Type: MAC Level of consciousness: awake, awake and alert and oriented Pain management: satisfactory to patient Vital Signs Assessment: post-procedure vital signs reviewed and stable Respiratory status: spontaneous breathing Cardiovascular status: blood pressure returned to baseline Anesthetic complications: no     Last Vitals:  Vitals:   10/26/18 1042  BP: (!) 143/99  Pulse: 81  Resp: 17  Temp: 36.6 C  SpO2: 99%    Last Pain:  Vitals:   10/26/18 1042  TempSrc: Oral  PainSc: 0-No pain                 Philbert Riser

## 2018-10-26 NOTE — Anesthesia Post-op Follow-up Note (Signed)
Anesthesia QCDR form completed.        

## 2018-10-26 NOTE — Anesthesia Preprocedure Evaluation (Signed)
Anesthesia Evaluation  Patient identified by MRN, date of birth, ID band Patient awake    Reviewed: Allergy & Precautions, NPO status , Patient's Chart, lab work & pertinent test results  History of Anesthesia Complications Negative for: history of anesthetic complications  Airway Mallampati: III  TM Distance: >3 FB Neck ROM: Full    Dental  (+) Implants   Pulmonary sleep apnea and Continuous Positive Airway Pressure Ventilation , COPD,    breath sounds clear to auscultation- rhonchi (-) wheezing      Cardiovascular Exercise Tolerance: Good hypertension, Pt. on medications (-) CAD, (-) Past MI, (-) Cardiac Stents and (-) CABG  Rhythm:Regular Rate:Normal - Systolic murmurs and - Diastolic murmurs    Neuro/Psych neg Seizures negative neurological ROS  negative psych ROS   GI/Hepatic negative GI ROS, Neg liver ROS,   Endo/Other  diabetes, Insulin Dependent  Renal/GU negative Renal ROS     Musculoskeletal negative musculoskeletal ROS (+)   Abdominal (+) + obese,   Peds  Hematology negative hematology ROS (+)   Anesthesia Other Findings Past Medical History: 08/22/2014: Acute bacterial sinusitis No date: COPD (chronic obstructive pulmonary disease) ([facility]) No date: Diabetes mellitus without complication ([facility]) No date: Enlarged prostate 03/01/2013: Essential (primary) hypertension     Comment:  Overview:  Last Assessment & Plan:  Taking meds as               prescribed. Check bmet. Continue to follow.  03/01/2013: HTN (hypertension) No date: Hypertension No date: Sleep apnea     Comment:  on CPAP, USUALLY...CURRENTLY BROKEN   Reproductive/Obstetrics                             Anesthesia Physical Anesthesia Plan  ASA: III  Anesthesia Plan: MAC   Post-op Pain Management:    Induction: Intravenous  PONV Risk Score and Plan: 1 and Midazolam  Airway Management Planned: Natural  Airway  Additional Equipment:   Intra-op Plan:   Post-operative Plan:   Informed Consent: I have reviewed the patients History and Physical, chart, labs and discussed the procedure including the risks, benefits and alternatives for the proposed anesthesia with the patient or authorized representative who has indicated his/her understanding and acceptance.       Plan Discussed with: CRNA and Anesthesiologist  Anesthesia Plan Comments:         Anesthesia Quick Evaluation

## 2018-10-26 NOTE — H&P (Signed)

## 2018-10-26 NOTE — Op Note (Signed)
OPERATIVE NOTE  Luke Coleman [id number] 10/26/2018   PREOPERATIVE DIAGNOSIS:  Nuclear sclerotic cataract left eye.  H25.12   POSTOPERATIVE DIAGNOSIS:    Nuclear sclerotic cataract left eye.     PROCEDURE:  Phacoemusification with posterior chamber intraocular lens placement of the left eye   LENS:   Implant Name Type Inv. Item Serial No. Manufacturer Lot No. LRB No. Used  LENS IOL DIOP 25.0 - [id number] Intraocular Lens LENS IOL DIOP 25.0 [id number] AMO  Left 1       PCB00 +25.0   ULTRASOUND TIME: 0 minutes 30 seconds.  CDE 2.07   SURGEON:  Benay Pillow, MD, MPH   ANESTHESIA:  Topical with tetracaine drops augmented with 1% preservative-free intracameral lidocaine.  ESTIMATED BLOOD LOSS: <1 mL   COMPLICATIONS:  None.   DESCRIPTION OF PROCEDURE:  The patient was identified in the holding room and transported to the operating room and placed in the supine position under the operating microscope.  The left eye was identified as the operative eye and it was prepped and draped in the usual sterile ophthalmic fashion.   A 1.0 millimeter clear-corneal paracentesis was made at the 5:00 position. 0.5 ml of preservative-free 1% lidocaine with epinephrine was injected into the anterior chamber.  The anterior chamber was filled with Healon 5 viscoelastic.  A 2.4 millimeter keratome was used to make a near-clear corneal incision at the 2:00 position.  A curvilinear capsulorrhexis was made with a cystotome and capsulorrhexis forceps.  Balanced salt solution was used to hydrodissect and hydrodelineate the nucleus.   Phacoemulsification was then used in stop and chop fashion to remove the lens nucleus and epinucleus.  The remaining cortex was then removed using the irrigation and aspiration handpiece. Healon was then placed into the capsular bag to distend it for lens placement.  A lens was then injected into the capsular bag.  The remaining viscoelastic was aspirated.   Wounds were hydrated  with balanced salt solution.  The anterior chamber was inflated to a physiologic pressure with balanced salt solution.   Intracameral vigamox 0.1 mL undiltued was injected into the eye and a drop placed onto the ocular surface.  No wound leaks were noted.  The patient was taken to the recovery room in stable condition without complications of anesthesia or surgery  Benay Pillow 10/26/2018, 1:17 PM

## 2018-10-26 NOTE — Discharge Instructions (Signed)

## 2018-10-26 NOTE — Transfer of Care (Signed)
Immediate Anesthesia Transfer of Care Note  Patient: Luke Coleman  Procedure(s) Performed: CATARACT EXTRACTION PHACO AND INTRAOCULAR LENS PLACEMENT ([facility]) LEFT (Left Eye)  Patient Location: PACU  Anesthesia Type:MAC  Level of Consciousness: awake, alert  and oriented  Airway & Oxygen Therapy: Patient Spontanous Breathing  Post-op Assessment: Report given to RN and Post -op Vital signs reviewed and stable  Post vital signs: Reviewed and stable  Last Vitals:  Vitals Value Taken Time  BP    Temp    Pulse    Resp    SpO2      Last Pain:  Vitals:   10/26/18 1042  TempSrc: Oral  PainSc: 0-No pain         Complications: No apparent anesthesia complications

## 2018-11-07 ENCOUNTER — Ambulatory Visit: Payer: BC Managed Care – PPO

## 2018-11-30 ENCOUNTER — Encounter: Payer: Self-pay | Admitting: Nurse Practitioner

## 2018-11-30 DIAGNOSIS — J302 Other seasonal allergic rhinitis: Secondary | ICD-10-CM

## 2018-12-01 MED ORDER — FLUTICASONE PROPIONATE 50 MCG/ACT NA SUSP: 2.0000 | Freq: Every day | NASAL | 6 refills | Status: DC

## 2019-01-10 ENCOUNTER — Other Ambulatory Visit: Payer: Self-pay

## 2019-01-10 ENCOUNTER — Other Ambulatory Visit: Payer: BC Managed Care – PPO

## 2019-01-10 DIAGNOSIS — N400 Enlarged prostate without lower urinary tract symptoms: Secondary | ICD-10-CM

## 2019-01-10 LAB — URINALYSIS, COMPLETE
Bilirubin, UA: NEGATIVE
Ketones, UA: NEGATIVE
Leukocytes,UA: NEGATIVE
Nitrite, UA: NEGATIVE
Protein,UA: NEGATIVE
RBC, UA: NEGATIVE
Specific Gravity, UA: 1.015 (ref 1.005–1.030)
Urobilinogen, Ur: 0.2 mg/dL (ref 0.2–1.0)
pH, UA: 5 (ref 5.0–7.5)

## 2019-01-10 LAB — MICROSCOPIC EXAMINATION
Bacteria, UA: NONE SEEN
Epithelial Cells (non renal): NONE SEEN /hpf (ref 0–10)
RBC, Urine: NONE SEEN /hpf (ref 0–2)
WBC, UA: NONE SEEN /hpf (ref 0–5)

## 2019-01-10 NOTE — Progress Notes (Signed)
Per Dr. Bernardo Heater patient's UA today is clear and should keep follow up with Larene Beach to discuss urinary symptoms , called patient no answer, sent my chart to notify

## 2019-01-11 ENCOUNTER — Telehealth: Payer: Self-pay | Admitting: Urology

## 2019-01-11 MED ORDER — TADALAFIL 5 MG PO TABS: 5.0000 mg | ORAL_TABLET | Freq: Every day | ORAL | 3 refills | Status: DC | PRN

## 2019-01-11 MED ORDER — FINASTERIDE 5 MG PO TABS: 5.0000 mg | ORAL_TABLET | Freq: Every day | ORAL | 3 refills | Status: DC

## 2019-01-11 NOTE — Telephone Encounter (Signed)
I spoke with Luke Coleman today regarding his negative UA results.  He states he has not been taking his Cialis or finasteride for awhile.  He did restart the finasteride two weeks ago when these symptoms started.  He is complaining of a slow stream, straining to urinate, frequency x q hour and nocturia x 6-7.   He did say the dysuria has abated.  I explained to him that he is probably not emptying his bladder and is likely going to go in to retention.  I advised him to come to the office today for a PVR and possible Foley placement.  He deferred and I explained to him that he may end up in acute retention and need to be seen in the ED and they would place a Foley emergently.   I also explained that not emptying the bladder may be damaging to his kidneys as well.  He stated he was comfortable with his urinary status at this time and would like to wait until his appointment on 01/24/2019.   I did send in scripts for the Cialis 5 mg daily and the finasteride 5 mg daily for him to start taking today.  I advised him if his symptoms should worsen to notify us or go to the ED immediately.

## 2019-01-13 LAB — CULTURE, URINE COMPREHENSIVE

## 2019-01-17 ENCOUNTER — Telehealth: Payer: Self-pay | Admitting: Urology

## 2019-01-17 NOTE — Telephone Encounter (Signed)
Would you move Luke Coleman to the 3rd?  I am not in the office on the 2nd.  Okay to double book.

## 2019-01-24 ENCOUNTER — Ambulatory Visit: Payer: BC Managed Care – PPO | Admitting: Urology

## 2019-02-05 NOTE — Progress Notes (Signed)
3:50 PM  02/06/19   Luke Coleman 1951/01/19 [id number]  Referring provider: [name], NP [place],  [place] [address]  CC: voiding issues  HPI: Patient is a 68 year old Caucasian male with BPH with LU TS and ED who presents today for a follow up.    BPH WITH LUTS  (prostate and/or bladder) IPSS score: 29/4    PVR: 57 mL     Major complaint(s): Frequency, urgency and nocturia x several weeks.  He has also noted a pain in his left groin that radiates to his posterior thigh that started yesterday.  He describes the pain as sharp not burning. Denies any dysuria, hematuria or suprapubic pain.   Currently taking: Cialis 5 mg daily and finasteride 5 mg daily.    Dr. Erlene Quan in our office in 05/2016: 68 yo M with BPH, ED  who returns today 4 recheck of a post void residual into reassess urinary symptoms. Last week, he presented to our clinic with acute worsening of his urinary symptoms after taking cold medication.   He has since stopped his Flomax and is restarted his Cialis. He continues to take finasteride.   Urine culture negative last week.   Today, he is doing a little bit better but continues to have urinary frequency and nocturia.   He does feel that he is emptying his bladder little bit better than last week.   He reports that  he has had an extremely dry mouth and has been drinking tones of water over the past few days.  He underwent cystoscopy in 05/2015  which showed enlarged median lobe.  His PVR was previously 51 cc.  Uroflow with peak flow 5.0 mL/s without obstructed pattern.   He was previously started on finasteride in October 2015 as well as Cialis 5 mg daily which worked well ( has issues with cost of medication).  He does have a remote history of TURP 20 year ago in [place].     Denies any recent fevers, chills, nausea or vomiting.  He does not have a family history of PCa.  IPSS    Row Name 02/06/19 1500         International Prostate Symptom  Score   How often have you had the sensation of not emptying your bladder?  Almost always     How often have you had to urinate less than every two hours?  Almost always     How often have you found you stopped and started again several times when you urinated?  Almost always     How often have you found it difficult to postpone urination?  Almost always     How often have you had a weak urinary stream?  About half the time     How often have you had to strain to start urination?  Less than 1 in 5 times     How many times did you typically get up at night to urinate?  5 Times     Total IPSS Score  29       Quality of Life due to urinary symptoms   If you were to spend the rest of your life with your urinary condition just the way it is now how would you feel about that?  Mostly Disatisfied        Score:  1-7 Mild 8-19 Moderate 20-35 Severe    PMH: Past Medical History:  Diagnosis Date   Acute bacterial  sinusitis 08/22/2014   COPD (chronic obstructive pulmonary disease) (HCC)    Diabetes mellitus without complication ([place])    Enlarged prostate    Essential (primary) hypertension 03/01/2013   Overview:  Last Assessment & Plan:  Taking meds as prescribed. Check bmet. Continue to follow.    HTN (hypertension) 03/01/2013   Hypertension    Sleep apnea    on CPAP, USUALLY...CURRENTLY BROKEN    Surgical History: Past Surgical History:  Procedure Laterality Date   APPENDECTOMY     CATARACT EXTRACTION W/PHACO Left 10/26/2018   Procedure: CATARACT EXTRACTION PHACO AND INTRAOCULAR LENS PLACEMENT (IOC) LEFT;  Surgeon: Eulogio Bear, MD;  Location: [facility] ORS;  Service: Ophthalmology;  Laterality: Left;  [name] 00:30.7 CDE 20.7 Fluid pack Lot # [id number]   CIRCUMCISION  Age 54   COLON SURGERY     HERNIA REPAIR     NASAL SEPTUM SURGERY     For deviated septum   SMALL INTESTINE SURGERY     Adhesions    Home Medications:  Allergies as of 02/06/2019      Reactions   Sulfa  Antibiotics Rash   Penile rash      Medication List       Accurate as of February 06, 2019  3:50 PM. If you have any questions, ask your nurse or doctor.        albuterol (2.5 MG/3ML) 0.083% nebulizer solution Commonly known as: PROVENTIL Take 3 mLs (2.5 mg total) by nebulization every 6 (six) hours as needed for wheezing or shortness of breath.   amoxicillin-clavulanate 875-125 MG tablet Commonly known as: AUGMENTIN Take 1 tablet by mouth 2 (two) times daily.   Basaglar KwikPen 100 UNIT/ML Sopn Inject 0.26 mLs (26 Units total) into the skin daily. Increase by 2 units every 3 days if fasting sugar is > 130 to max of 40 units daily.   clindamycin 1 % gel Commonly known as: CLINDAGEL Apply topically 2 (two) times daily.   diazepam 10 MG tablet Commonly known as: Valium Take one tablet 30 minutes prior to the cystoscopy; must have a driver Started by: Zara Council, PA-C   finasteride 5 MG tablet Commonly known as: Proscar Take 1 tablet (5 mg total) by mouth daily.   fluticasone 50 MCG/ACT nasal spray Commonly known as: FLONASE Place 2 sprays into both nostrils daily.   glucose blood test strip Commonly known as: ONE TOUCH ULTRA TEST USE TO TEST THREE TIMES DAILY   Insulin Pen Needle 32G X 4 MM Misc Commonly known as: BD Pen Needle Nano U/F 1 Device by Does not apply route daily.   ipratropium 0.06 % nasal spray Commonly known as: ATROVENT USE 2 SPRAYS IN EACH NOSTRIL FOUR TIMES DAILY FOR UP TO 5 TO 7 DAYS THEN STOP   Jardiance 25 MG Tabs tablet Generic drug: empagliflozin Take 25 mg by mouth daily.   Lancets 28G Misc 3 (three) times daily. Use as instructed. DX: E11.9   lisinopril-hydrochlorothiazide 20-12.5 MG tablet Commonly known as: ZESTORETIC TAKE 1 TABLET BY MOUTH DAILY   predniSONE 50 MG tablet Commonly known as: DELTASONE Take 1 tablet (50 mg total) by mouth daily with breakfast.   tadalafil 5 MG tablet Commonly known as: CIALIS Take 1 tablet (5  mg total) by mouth daily as needed for erectile dysfunction.   tadalafil 5 MG tablet Commonly known as: CIALIS Take 1 tablet (5 mg total) by mouth daily as needed for erectile dysfunction.   triamcinolone 0.025 % ointment Commonly known  as: KENALOG Apply 1 application topically 2 (two) times daily.   Trulicity 1.5 IR/4.4RX Sopn Generic drug: Dulaglutide Inject 1.5 mg into the skin every 7 (seven) days.       Allergies:  Allergies  Allergen Reactions   Sulfa Antibiotics Rash    Penile rash    Family History: Family History  Problem Relation Age of Onset   Early death Mother        MVA - died age 70   Alcohol abuse Father    Hypertension Father    Cancer Father 42       renal cell carcinoma   Heart disease Father 46       MI - died   Diabetes Brother    Sleep apnea Brother    Sleep apnea Daughter    Sleep apnea Brother    Diabetes Maternal Uncle    Diabetes Paternal Aunt    Kidney disease Maternal Grandmother     Social History:  reports that he has never smoked. He has never used smokeless tobacco. He reports that he does not drink alcohol or use drugs.  Physical Exam: BP 132/83 (BP Location: Left Arm, Patient Position: Sitting)    Pulse 92    Ht 5\' 7"  (1.702 m)    Wt 213 lb (96.6 kg)    BMI 33.36 kg/m   Constitutional:  Well nourished. Alert and oriented, No acute distress. HEENT: [place] AT, moist mucus membranes.  Trachea midline, no masses. Cardiovascular: No clubbing, cyanosis, or edema. Respiratory: Normal respiratory effort, no increased work of breathing. GI: Abdomen is soft, non tender, non distended, no abdominal masses. Liver and spleen not palpable.  No hernias appreciated.  Stool sample for occult testing is not indicated.   GU: No CVA tenderness.  No bladder fullness or masses.  Patient with circumcised phallus.  Urethral meatus is patent.  No penile discharge. No penile lesions or rashes. Scrotum without lesions, cysts, rashes and/or edema.   Testicles are located scrotally bilaterally. No masses are appreciated in the testicles. Left and right epididymis are normal. Rectal: Patient with  normal sphincter tone. Anus and perineum without scarring or rashes. No rectal masses are appreciated. Prostate is approximately 60 grams, could only palpate the apex, tender to palpation, no nodules are appreciated.  Skin: No rashes, bruises or suspicious lesions. Lymph: No cervical or inguinal adenopathy. Neurologic: Grossly intact, no focal deficits, moving all 4 extremities. Psychiatric: Normal mood and affect.  Laboratory Data: Urinalysis 2+ glucose.  See Epic.   Lab Results  Component Value Date   CREATININE 1.30 (H) 06/02/2018    Lab Results  Component Value Date   PSA 1.4 06/02/2018   PSA 0.47 05/13/2017    Lab Results  Component Value Date   HGBA1C 6.6 (A) 06/02/2018   I have reviewed the labs.  Results for orders placed or performed in visit on 02/06/19  Bladder Scan (Post Void Residual) in office  Result Value Ref Range   Scan Result 60ml     1. BPH with LUTS IPSS score is 29/4 Continue conservative management, avoiding bladder irritants and timed voiding's Most bothersome symptoms is/are frequency, urgency and nocturia Continue tamsulosin 0.4 mg daily and finasteride 5 mg daily  2. Prostatitis Prostate tender on exam with irritative voiding symptoms  Will start Augmentin 875/125, BID for 30 days then reassess - RTC in one month for I PSS and PVR  If no improvement - will likely need cystoscopy to prepare for possible outlet procedure  as he has a history of a median lobe  3. Incomplete bladder emptying  See above   Zara Council, [facility]  [place] [address] [place],  [address] [phone]

## 2019-02-06 ENCOUNTER — Encounter: Payer: Self-pay | Admitting: Urology

## 2019-02-06 ENCOUNTER — Other Ambulatory Visit: Payer: Self-pay

## 2019-02-06 ENCOUNTER — Ambulatory Visit: Payer: BC Managed Care – PPO | Admitting: Urology

## 2019-02-06 VITALS — BP 132/83 | HR 92 | Ht 67.0 in | Wt 213.0 lb

## 2019-02-06 DIAGNOSIS — N419 Inflammatory disease of prostate, unspecified: Secondary | ICD-10-CM

## 2019-02-06 DIAGNOSIS — N401 Enlarged prostate with lower urinary tract symptoms: Secondary | ICD-10-CM

## 2019-02-06 DIAGNOSIS — R339 Retention of urine, unspecified: Secondary | ICD-10-CM

## 2019-02-06 DIAGNOSIS — N138 Other obstructive and reflux uropathy: Secondary | ICD-10-CM | POA: Diagnosis not present

## 2019-02-06 LAB — BLADDER SCAN AMB NON-IMAGING

## 2019-02-06 MED ORDER — AMOXICILLIN-POT CLAVULANATE 875-125 MG PO TABS: 1.0000 | ORAL_TABLET | Freq: Two times a day (BID) | ORAL | 0 refills | Status: DC

## 2019-02-06 MED ORDER — DIAZEPAM 10 MG PO TABS: ORAL_TABLET | ORAL | 0 refills | Status: DC

## 2019-02-08 LAB — MICROSCOPIC EXAMINATION
Bacteria, UA: NONE SEEN
Epithelial Cells (non renal): NONE SEEN /hpf (ref 0–10)
RBC, Urine: NONE SEEN /hpf (ref 0–2)
WBC, UA: NONE SEEN /hpf (ref 0–5)

## 2019-02-08 LAB — CULTURE, URINE COMPREHENSIVE

## 2019-02-08 LAB — URINALYSIS, COMPLETE
Bilirubin, UA: NEGATIVE
Ketones, UA: NEGATIVE
Leukocytes,UA: NEGATIVE
Nitrite, UA: NEGATIVE
Protein,UA: NEGATIVE
RBC, UA: NEGATIVE
Specific Gravity, UA: 1.015 (ref 1.005–1.030)
Urobilinogen, Ur: 0.2 mg/dL (ref 0.2–1.0)
pH, UA: 5.5 (ref 5.0–7.5)

## 2019-02-15 ENCOUNTER — Telehealth: Payer: Self-pay

## 2019-02-15 NOTE — Telephone Encounter (Signed)
Called pt's insurance company to initiate prior Luke Coleman, was informed by patient's insurance carrier that the patient has already picked RX up and paid cash for [employer].

## 2019-02-21 ENCOUNTER — Other Ambulatory Visit: Payer: Self-pay | Admitting: Nurse Practitioner

## 2019-02-21 DIAGNOSIS — I10 Essential (primary) hypertension: Secondary | ICD-10-CM

## 2019-03-20 NOTE — Progress Notes (Signed)
2:44 PM  03/21/19   [name] July 11, 1951 [id number]  Referring provider: [name], NP [place],   [id number]  CC: voiding issues  HPI: Patient is a 68 year old male, BPH with LU TS, ED and prostatitis who presents today for a one month follow up.    He presented on 02/06/2019 with the complaints of frequency, urgency and nocturia that had been presents for several weeks.  He had groin pain associated with his urinary symptoms.  His prostate was tender on exam and he was started on Augmentin 875/125, BID for 30 days.    His UA and urine culture was negative.    I PSS score: 24/3             PVR: 71 mL     Previous IPSS score: 29/4    Previous PVR: 57 mL     Major complaint(s): Frequency, urgency and nocturia x several weeks.  He states the pain in the groin area has resolved.  He was able to take 70 tablets of the Augemtin prior to having to stop due to diarrhea.  Patient denies any gross hematuria, dysuria or suprapubic/flank pain.  Patient denies any fevers, chills, nausea or vomiting.   Currently taking: Cialis 5 mg daily and finasteride 5 mg daily.    Dr. Erlene Quan in our office in 05/2016: 68 yo M with BPH, ED  who returns today 4 recheck of a post void residual into reassess urinary symptoms. Last week, he presented to our clinic with acute worsening of his urinary symptoms after taking cold medication.   He has since stopped his Flomax and is restarted his Cialis. He continues to take finasteride.   Urine culture negative last week.   Today, he is doing a little bit better but continues to have urinary frequency and nocturia.   He does feel that he is emptying his bladder little bit better than last week.   He reports that  he has had an extremely dry mouth and has been drinking tones of water over the past few days.  He underwent cystoscopy in 05/2015  which showed enlarged median lobe.  His PVR was previously 51 cc.  Uroflow with peak flow 5.0 mL/s  without obstructed pattern.   He was previously started on finasteride in October 2015 as well as Cialis 5 mg daily which worked well ( has issues with cost of medication).  He does have a remote history of TURP 20 year ago in [place].     Denies any recent fevers, chills, nausea or vomiting.  He does not have a family history of PCa.  IPSS    Row Name 03/21/19 1400         International Prostate Symptom Score   How often have you had the sensation of not emptying your bladder?  Less than 1 in 5     How often have you had to urinate less than every two hours?  Almost always     How often have you found you stopped and started again several times when you urinated?  Almost always     How often have you found it difficult to postpone urination?  Almost always     How often have you had a weak urinary stream?  More than half the time     How often have you had to strain to start urination?  Less than 1 in 5 times     How  many times did you typically get up at night to urinate?  3 Times     Total IPSS Score  24       Quality of Life due to urinary symptoms   If you were to spend the rest of your life with your urinary condition just the way it is now how would you feel about that?  Mixed        Score:  1-7 Mild 8-19 Moderate 20-35 Severe    PMH: Past Medical History:  Diagnosis Date  . Acute bacterial sinusitis 08/22/2014  . COPD (chronic obstructive pulmonary disease) ([place])   . Diabetes mellitus without complication ([place])   . Enlarged prostate   . Essential (primary) hypertension 03/01/2013   Overview:  Last Assessment & Plan:  Taking meds as prescribed. Check bmet. Continue to follow.   [place] HTN (hypertension) 03/01/2013  . Hypertension   . Sleep apnea    on CPAP, USUALLY...CURRENTLY BROKEN    Surgical History: Past Surgical History:  Procedure Laterality Date  . APPENDECTOMY    . CATARACT EXTRACTION W/PHACO Left 10/26/2018   Procedure: CATARACT EXTRACTION PHACO AND  INTRAOCULAR LENS PLACEMENT ([place]) LEFT;  Surgeon: Eulogio Bear, MD;  Location: [facility] ORS;  Service: Ophthalmology;  Laterality: Left;  [name] 00:30.7 CDE 20.7 Fluid pack Lot # [id number]  . CIRCUMCISION  Age 40  . COLON SURGERY    . HERNIA REPAIR    . NASAL SEPTUM SURGERY     For deviated septum  . SMALL INTESTINE SURGERY     Adhesions    Home Medications:  Allergies as of 03/21/2019      Reactions   Sulfa Antibiotics Rash   Penile rash      Medication List       Accurate as of March 21, 2019  2:44 PM. If you have any questions, ask your nurse or doctor.        STOP taking these medications   albuterol (2.5 MG/3ML) 0.083% nebulizer solution Commonly known as: PROVENTIL Stopped by: Zara Council, PA-C   Basaglar KwikPen 100 UNIT/ML Sopn Stopped by: Jayton Popelka, PA-C   clindamycin 1 % gel Commonly known as: CLINDAGEL Stopped by: Yeilyn Gent, PA-C   predniSONE 50 MG tablet Commonly known as: DELTASONE Stopped by: Illyanna Petillo, PA-C   triamcinolone 0.025 % ointment Commonly known as: KENALOG Stopped by: Deleon Passe, PA-C     TAKE these medications   amoxicillin-clavulanate 875-125 MG tablet Commonly known as: AUGMENTIN Take 1 tablet by mouth every 12 (twelve) hours.   ciprofloxacin 500 MG tablet Commonly known as: CIPRO Take 1 tablet (500 mg total) by mouth every 12 (twelve) hours. Started by: Zara Council, PA-C   diazepam 10 MG tablet Commonly known as: Valium Take one tablet 30 minutes prior to the cystoscopy; must have a driver   finasteride 5 MG tablet Commonly known as: Proscar Take 1 tablet (5 mg total) by mouth daily.   fluticasone 50 MCG/ACT nasal spray Commonly known as: FLONASE Place 2 sprays into both nostrils daily.   glucose blood test strip Commonly known as: ONE TOUCH ULTRA TEST USE TO TEST THREE TIMES DAILY   Insulin Pen Needle 32G X 4 MM Misc Commonly known as: BD Pen Needle Nano U/F 1 Device by Does not apply  route daily.   ipratropium 0.06 % nasal spray Commonly known as: ATROVENT USE 2 SPRAYS IN EACH NOSTRIL FOUR TIMES DAILY FOR UP TO 5 TO 7 DAYS THEN STOP   Jardiance  25 MG Tabs tablet Generic drug: empagliflozin Take 25 mg by mouth daily.   Lancets 28G Misc 3 (three) times daily. Use as instructed. DX: E11.9   lisinopril-hydrochlorothiazide 20-12.5 MG tablet Commonly known as: ZESTORETIC TAKE 1 TABLET BY MOUTH DAILY   tadalafil 5 MG tablet Commonly known as: CIALIS Take 1 tablet (5 mg total) by mouth daily as needed for erectile dysfunction.   tadalafil 5 MG tablet Commonly known as: CIALIS Take 1 tablet (5 mg total) by mouth daily as needed for erectile dysfunction.   Trulicity 1.5 BT/5.1VO Sopn Generic drug: Dulaglutide Inject 1.5 mg into the skin every 7 (seven) days.       Allergies:  Allergies  Allergen Reactions  . Sulfa Antibiotics Rash    Penile rash    Family History: Family History  Problem Relation Age of Onset  . Early death Mother        MVA - died age 31  . Alcohol abuse Father   . Hypertension Father   . Cancer Father 34       renal cell carcinoma  . Heart disease Father 44       MI - died  . Diabetes Brother   . Sleep apnea Brother   . Sleep apnea Daughter   . Sleep apnea Brother   . Diabetes Maternal Uncle   . Diabetes Paternal Aunt   . Kidney disease Maternal Grandmother     Social History:  reports that he has never smoked. He has never used smokeless tobacco. He reports that he does not drink alcohol or use drugs.  Physical Exam: BP 112/64 (BP Location: Left Arm, Patient Position: Sitting, Cuff Size: Normal)   Pulse (!) 48   Ht 5\' 7"  (1.702 m)   Wt 210 lb (95.3 kg)   BMI 32.89 kg/m   Constitutional:  Well nourished. Alert and oriented, No acute distress. HEENT: [place] AT, moist mucus membranes.  Trachea midline, no masses. Cardiovascular: No clubbing, cyanosis, or edema. Respiratory: Normal respiratory effort, no increased work of  breathing. Rectal: Patient with  normal sphincter tone. Anus and perineum tender out of portion with exam, without scarring or rashes. No fluctuance or crepitus noted.  No rectal masses are appreciated. Prostate is approximately 60 grams, could only palpate the apex, no  nodules are appreciated.  The prostate exam caused the patient intense urinary urgency.  Neurologic: Grossly intact, no focal deficits, moving all 4 extremities. Psychiatric: Normal mood and affect.  Laboratory Data: Lab Results  Component Value Date   CREATININE 1.30 (H) 06/02/2018    Lab Results  Component Value Date   PSA 1.4 06/02/2018   PSA 0.47 05/13/2017    Lab Results  Component Value Date   HGBA1C 6.6 (A) 06/02/2018   I have reviewed the labs.  Pertinent Imaging Results for orders placed or performed in visit on 03/21/19  Bladder Scan (Post Void Residual) in office  Result Value Ref Range   Scan Result 71     1. BPH with LUTS IPSS score is 24/3, it is improved somewhat Continue conservative management, avoiding bladder irritants and timed voiding's Most bothersome symptoms is/are frequency, urgency and nocturia Continue tamsulosin 0.4 mg daily and finasteride 5 mg daily  2. Prostatitis Prostate tender on exam with irritative voiding symptoms  Will prescribe Cipro 500 mg BID (patient to take probiotic) and schedule him for a cystoscopy for the refractory urgency I have explained to the patient that they will  be scheduled for a cystoscopy  in our office to evaluate their bladder.  The cystoscopy consists of passing a tube with a lens up through their urethra and into their urinary bladder.   We will inject the urethra with a lidocaine gel prior to introducing the cystoscope to help with any discomfort during the procedure.   After the procedure, they might experience blood in the urine and discomfort with urination.  This will abate after the first few voids.  I have  encouraged the patient to increase  water intake  during this time.  Patient denies any allergies to lidocaine.  He is given a Valium to take prior to the cystoscopy - he is instructed to have a driver If cystoscopy is negative, would consider physical therapy evaluation   3. Incomplete bladder emptying  See above   Zara Council, [facility]  [place] [address] [place], [place] [address] [phone]

## 2019-03-21 ENCOUNTER — Ambulatory Visit: Payer: BC Managed Care – PPO | Admitting: Urology

## 2019-03-21 ENCOUNTER — Other Ambulatory Visit: Payer: Self-pay

## 2019-03-21 ENCOUNTER — Encounter: Payer: Self-pay | Admitting: Urology

## 2019-03-21 VITALS — BP 112/64 | HR 48 | Ht 67.0 in | Wt 210.0 lb

## 2019-03-21 DIAGNOSIS — N401 Enlarged prostate with lower urinary tract symptoms: Secondary | ICD-10-CM | POA: Diagnosis not present

## 2019-03-21 DIAGNOSIS — R339 Retention of urine, unspecified: Secondary | ICD-10-CM

## 2019-03-21 DIAGNOSIS — N138 Other obstructive and reflux uropathy: Secondary | ICD-10-CM | POA: Diagnosis not present

## 2019-03-21 DIAGNOSIS — N419 Inflammatory disease of prostate, unspecified: Secondary | ICD-10-CM | POA: Diagnosis not present

## 2019-03-21 LAB — BLADDER SCAN AMB NON-IMAGING: Scan Result: 71

## 2019-03-21 MED ORDER — CIPROFLOXACIN HCL 500 MG PO TABS: 500.0000 mg | ORAL_TABLET | Freq: Two times a day (BID) | ORAL | 0 refills | Status: DC

## 2019-03-29 ENCOUNTER — Other Ambulatory Visit: Payer: Self-pay | Admitting: Urology

## 2019-03-30 ENCOUNTER — Other Ambulatory Visit: Payer: Self-pay | Admitting: Urology

## 2019-03-30 MED ORDER — BETAMETHASONE DIPROPIONATE 0.05 % EX CREA: TOPICAL_CREAM | Freq: Two times a day (BID) | CUTANEOUS | 0 refills | Status: DC

## 2019-04-25 ENCOUNTER — Encounter: Payer: Self-pay | Admitting: Urology

## 2019-04-25 ENCOUNTER — Ambulatory Visit (INDEPENDENT_AMBULATORY_CARE_PROVIDER_SITE_OTHER): Payer: BC Managed Care – PPO | Admitting: Urology

## 2019-04-25 ENCOUNTER — Other Ambulatory Visit: Payer: Self-pay

## 2019-04-25 VITALS — BP 138/68 | HR 67 | Ht 67.0 in | Wt 208.0 lb

## 2019-04-25 DIAGNOSIS — N401 Enlarged prostate with lower urinary tract symptoms: Secondary | ICD-10-CM | POA: Diagnosis not present

## 2019-04-25 DIAGNOSIS — N138 Other obstructive and reflux uropathy: Secondary | ICD-10-CM

## 2019-04-25 LAB — URINALYSIS, COMPLETE
Bilirubin, UA: NEGATIVE
Ketones, UA: NEGATIVE
Leukocytes,UA: NEGATIVE
Nitrite, UA: NEGATIVE
Protein,UA: NEGATIVE
RBC, UA: NEGATIVE
Specific Gravity, UA: 1.02 (ref 1.005–1.030)
Urobilinogen, Ur: 0.2 mg/dL (ref 0.2–1.0)
pH, UA: 5 (ref 5.0–7.5)

## 2019-04-25 LAB — MICROSCOPIC EXAMINATION
Bacteria, UA: NONE SEEN
RBC: NONE SEEN /hpf (ref 0–2)

## 2019-04-25 NOTE — Progress Notes (Signed)
   04/25/19  CC:  Chief Complaint  Patient presents with  . Cysto    HPI: 68 year old male with refractory urinary symptoms, dysuria and recent episode of prostatitis who presents today for cystoscopic evaluation.  Please see Grayce Sessions notes for details.  Blood pressure 138/68, pulse 67, height 5\' 7"  (1.702 m), weight 208 lb (94.3 kg). NED. A&Ox3.   No respiratory distress   Abd soft, NT, ND Normal phallus with bilateral descended testicles  Cystoscopy Procedure Note  Patient identification was confirmed, informed consent was obtained, and patient was prepped using Betadine solution.  Lidocaine jelly was administered per urethral meatus.     Pre-Procedure: - Inspection reveals a normal caliber ureteral meatus.  Procedure: The flexible cystoscope was introduced without difficulty - No urethral strictures/lesions are present. - Mildly enlarged prostate, 4 cm prostatic length - Normal bladder neck - Bilateral ureteral orifices identified - Bladder mucosa  reveals no ulcers, tumors, or lesions - No bladder stones -Mild to moderate trabeculation few small saccules  Retroflexion shows well-circumscribed small median lobe   Post-Procedure: - Patient tolerated the procedure well  Assessment/ Plan:  1. BPH with obstruction/lower urinary tract symptoms Cystoscopic evidence of prostatic regrowth today with a small well-circumscribed median lobe as well as evidence of chronic outlet obstruction We started the discussion today regarding the role of an outlet procedure and he is innumerable questions He is status post TURP 20 years ago and has had retrograde/and ejaculation since then which is very bothersome to him (no evidence of TURP defect on cystoscopy today) As such, I like to schedule a visit to sit down and talk about various treatment options-he would like for his wife to be present for this conversation as he has a difficulty hearing He is currently on maximal  medical therapy for his urinary symptoms including on finasteride, daily Cialis and most recently on Cipro for prostatitis - Urinalysis, Complete   Schedule follow-up to discuss possible outlet procedure  [name], MD

## 2019-05-10 ENCOUNTER — Other Ambulatory Visit: Payer: Self-pay

## 2019-05-10 ENCOUNTER — Ambulatory Visit: Payer: BC Managed Care – PPO | Admitting: Urology

## 2019-05-10 ENCOUNTER — Encounter: Payer: Self-pay | Admitting: Urology

## 2019-05-10 VITALS — BP 122/73 | HR 94 | Ht 67.0 in | Wt 211.0 lb

## 2019-05-10 DIAGNOSIS — N138 Other obstructive and reflux uropathy: Secondary | ICD-10-CM | POA: Diagnosis not present

## 2019-05-10 DIAGNOSIS — N401 Enlarged prostate with lower urinary tract symptoms: Secondary | ICD-10-CM | POA: Diagnosis not present

## 2019-05-10 NOTE — Progress Notes (Signed)
05/10/2019 2:01 PM   Luke Coleman [birth year] [id number]  Referring provider: [name], NP [place],  [place] [address]  Chief Complaint  Patient presents with  . Follow-up    HPI: 68 year old male with a personal history of BPH who presents today to discuss surgical management of his poorly controlled urinary symptoms.  He is currently on finasteride and Cialis for his BPH symptoms.  Despite this, he continues to have refractory urinary urgency, frequency, weak stream, sensation of incomplete bladder emptying.    Cystoscopic evaluation does indicate small well-circumscribed median lobe as well as bilobar coaptation.  He presumably has a history of TURP 20 years ago but does not have a significant TURP defect on cystoscopy.  He also had a uroflow several years back with an obstructive flow pattern.  He is now interested surgical intervention.  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  Please see previous notes for details.   PMH: Past Medical History:  Diagnosis Date  . Acute bacterial sinusitis 08/22/2014  . COPD (chronic obstructive pulmonary disease) ([place])   . Diabetes mellitus without complication ([place])   . Enlarged prostate   . Essential (primary) hypertension 03/01/2013   Overview:  Last Assessment & Plan:  Taking meds as prescribed. Check bmet. Continue to follow.   Marland Kitchen HTN (hypertension) 03/01/2013  . Hypertension   . Sleep apnea    on CPAP, USUALLY...CURRENTLY BROKEN    Surgical History: Past Surgical History:  Procedure Laterality Date  . APPENDECTOMY    . CATARACT EXTRACTION W/PHACO Left 10/26/2018   Procedure: CATARACT EXTRACTION PHACO AND INTRAOCULAR LENS PLACEMENT ([place]) LEFT;  Surgeon: Eulogio Bear, MD;  Location: [facility] ORS;  Service: Ophthalmology;  Laterality: Left;  [name] 00:30.7 CDE 20.7 Fluid pack Lot # [id number]  . CIRCUMCISION  Age 10  . COLON SURGERY    . HERNIA REPAIR    . NASAL SEPTUM SURGERY     For deviated septum   . SMALL INTESTINE SURGERY     Adhesions    Home Medications:  Allergies as of 05/10/2019      Reactions   Sulfa Antibiotics Rash   Penile rash      Medication List       Accurate as of May 10, 2019  2:01 PM. If you have any questions, ask your nurse or doctor.        STOP taking these medications   ciprofloxacin 500 MG tablet Commonly known as: CIPRO Stopped by: [name], MD   diazepam 10 MG tablet Commonly known as: Valium Stopped by: [name], MD     TAKE these medications   betamethasone dipropionate 0.05 % cream Commonly known as: DIPROLENE Apply topically 2 (two) times daily.   finasteride 5 MG tablet Commonly known as: Proscar Take 1 tablet (5 mg total) by mouth daily.   fluticasone 50 MCG/ACT nasal spray Commonly known as: FLONASE Place 2 sprays into both nostrils daily.   glucose blood test strip Commonly known as: ONE TOUCH ULTRA TEST USE TO TEST THREE TIMES DAILY   Insulin Pen Needle 32G X 4 MM Misc Commonly known as: BD Pen Needle Nano U/F 1 Device by Does not apply route daily.   ipratropium 0.06 % nasal spray Commonly known as: ATROVENT USE 2 SPRAYS IN EACH NOSTRIL FOUR TIMES DAILY FOR UP TO 5 TO 7 DAYS THEN STOP   Jardiance 25 MG Tabs tablet Generic drug: empagliflozin Take 25 mg by mouth daily.   Lancets 28G  Misc 3 (three) times daily. Use as instructed. DX: E11.9   lisinopril-hydrochlorothiazide 20-12.5 MG tablet Commonly known as: ZESTORETIC TAKE 1 TABLET BY MOUTH DAILY   tadalafil 5 MG tablet Commonly known as: CIALIS Take 1 tablet (5 mg total) by mouth daily as needed for erectile dysfunction.   Trulicity 1.5 0000000 Sopn Generic drug: Dulaglutide Inject 1.5 mg into the skin every 7 (seven) days.       Allergies:  Allergies  Allergen Reactions  . Sulfa Antibiotics Rash    Penile rash    Family History: Family History  Problem Relation Age of Onset  . Early death Mother        MVA - died age 1   . Alcohol abuse Father   . Hypertension Father   . Cancer Father 58       renal cell carcinoma  . Heart disease Father 61       MI - died  . Diabetes Brother   . Sleep apnea Brother   . Sleep apnea Daughter   . Sleep apnea Brother   . Diabetes Maternal Uncle   . Diabetes Paternal Aunt   . Kidney disease Maternal Grandmother     Social History:  reports that he has never smoked. He has never used smokeless tobacco. He reports that he does not drink alcohol or use drugs.  ROS: UROLOGY Frequent Urination?: Yes Hard to postpone urination?: Yes Burning/pain with urination?: No Get up at night to urinate?: Yes Leakage of urine?: No Urine stream starts and stops?: No Trouble starting stream?: No Do you have to strain to urinate?: No Blood in urine?: No Urinary tract infection?: No Sexually transmitted disease?: No Injury to kidneys or bladder?: No Painful intercourse?: No Weak stream?: Yes Erection problems?: No Penile pain?: Yes  Gastrointestinal Nausea?: No Vomiting?: No Indigestion/heartburn?: No Diarrhea?: No Constipation?: No  Constitutional Fever: No Night sweats?: No Weight loss?: No Fatigue?: No  Skin Skin rash/lesions?: No Itching?: No  Eyes Blurred vision?: No Double vision?: No  Ears/Nose/Throat Sore throat?: No Sinus problems?: No  Hematologic/Lymphatic Swollen glands?: No Easy bruising?: No  Cardiovascular Leg swelling?: No Chest pain?: No  Respiratory Cough?: No Shortness of breath?: No  Endocrine Excessive thirst?: No  Musculoskeletal Back pain?: No Joint pain?: No  Neurological Headaches?: No Dizziness?: No  Psychologic Depression?: No Anxiety?: No  Physical Exam: BP 122/73   Pulse 94   Ht 5\' 7"  (1.702 m)   Wt 211 lb (95.7 kg)   BMI 33.05 kg/m   Constitutional:  Alert and oriented, No acute distress. HEENT: [place] AT, moist mucus membranes.  Trachea midline, no masses. Cardiovascular: No clubbing, cyanosis, or  edema. Respiratory: Normal respiratory effort, no increased work of breathing. Skin: No rashes, bruises or suspicious lesions. Neurologic: Grossly intact, no focal deficits, moving all 4 extremities. Psychiatric: Normal mood and affect.  Laboratory Data: Lab Results  Component Value Date   WBC 7.0 06/28/2018   HGB 15.4 06/28/2018   HCT 44.7 06/28/2018   MCV 92.5 06/28/2018   PLT 177 06/28/2018    Lab Results  Component Value Date   CREATININE 1.30 (H) 06/02/2018    Lab Results  Component Value Date   PSA 1.4 06/02/2018   PSA 0.47 05/13/2017    Lab Results  Component Value Date   HGBA1C 6.6 (A) 06/02/2018    Assessment & Plan:    1. BPH with obstruction/lower urinary tract symptoms Poorly controlled refractory symptoms with both irritative voiding symptoms despite finasteride  and daily Cialis  Cystoscopic evaluation reveals intravesical median lobe and evidence of prostatic regrowth.  We discussed the option of surgical management to address primarily his obstructive urinary symptoms.  We discussed various techniques including UroLift, TURP, holep amongst others.  Given the presence of the median lobe component, more strongly recommend holep versus traditional TURP.  Risk and benefits of each were discussed.  He is most interested holep with the goal of having an outpatient procedure.  We discussed alternatives including TURP vs. holmium laser enucleation of the prostate vs. greenlight laser ablation. Differences between the surgical procedures were discussed as well as the risks and benefits of each. He is most interested in HoLEP.  We discussed the common postoperative course following holep including need for overnight Foley catheter, temporary worsening of irritative voiding symptoms, and occasional stress incontinence which typically lasts up to 6 months but can persist. We discussed retrograde ejaculation and damage to surrounding structures including the urinary  sphincter. Other uncommon complications including hematuria and urinary tract infection.  He understands all of the above and is willing to proceed as planned.  [name], MD  [facility] [address], [place], [place] [address] [phone]

## 2019-05-10 NOTE — Patient Instructions (Signed)

## 2019-05-10 NOTE — H&P (View-Only) (Signed)
05/10/2019 2:01 PM   [name] Jul 03, 1951 [id number]  Referring provider: [name], NP [place],  [place] [address]  Chief Complaint  Patient presents with  . Follow-up    HPI: 68 year old male with a personal history of BPH who presents today to discuss surgical management of his poorly controlled urinary symptoms.  He is currently on finasteride and Cialis for his BPH symptoms.  Despite this, he continues to have refractory urinary urgency, frequency, weak stream, sensation of incomplete bladder emptying.    Cystoscopic evaluation does indicate small well-circumscribed median lobe as well as bilobar coaptation.  He presumably has a history of TURP 20 years ago but does not have a significant TURP defect on cystoscopy.  He also had a uroflow several years back with an obstructive flow pattern.  He is now interested surgical intervention.  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  Please see previous notes for details.   PMH: Past Medical History:  Diagnosis Date  . Acute bacterial sinusitis 08/22/2014  . COPD (chronic obstructive pulmonary disease) ([place])   . Diabetes mellitus without complication ([place])   . Enlarged prostate   . Essential (primary) hypertension 03/01/2013   Overview:  Last Assessment & Plan:  Taking meds as prescribed. Check bmet. Continue to follow.   Marland Kitchen HTN (hypertension) 03/01/2013  . Hypertension   . Sleep apnea    on CPAP, USUALLY...CURRENTLY BROKEN    Surgical History: Past Surgical History:  Procedure Laterality Date  . APPENDECTOMY    . CATARACT EXTRACTION W/PHACO Left 10/26/2018   Procedure: CATARACT EXTRACTION PHACO AND INTRAOCULAR LENS PLACEMENT ([place]) LEFT;  Surgeon: Eulogio Bear, MD;  Location: [facility] ORS;  Service: Ophthalmology;  Laterality: Left;  [name] 00:30.7 CDE 20.7 Fluid pack Lot # [id number]  . CIRCUMCISION  Age 72  . COLON SURGERY    . HERNIA REPAIR    . NASAL SEPTUM SURGERY     For deviated septum   . SMALL INTESTINE SURGERY     Adhesions    Home Medications:  Allergies as of 05/10/2019      Reactions   Sulfa Antibiotics Rash   Penile rash      Medication List       Accurate as of May 10, 2019  2:01 PM. If you have any questions, ask your nurse or doctor.        STOP taking these medications   ciprofloxacin 500 MG tablet Commonly known as: CIPRO Stopped by: [name], MD   diazepam 10 MG tablet Commonly known as: Valium Stopped by: [name], MD     TAKE these medications   betamethasone dipropionate 0.05 % cream Commonly known as: DIPROLENE Apply topically 2 (two) times daily.   finasteride 5 MG tablet Commonly known as: Proscar Take 1 tablet (5 mg total) by mouth daily.   fluticasone 50 MCG/ACT nasal spray Commonly known as: FLONASE Place 2 sprays into both nostrils daily.   glucose blood test strip Commonly known as: ONE TOUCH ULTRA TEST USE TO TEST THREE TIMES DAILY   Insulin Pen Needle 32G X 4 MM Misc Commonly known as: BD Pen Needle Nano U/F 1 Device by Does not apply route daily.   ipratropium 0.06 % nasal spray Commonly known as: ATROVENT USE 2 SPRAYS IN EACH NOSTRIL FOUR TIMES DAILY FOR UP TO 5 TO 7 DAYS THEN STOP   Jardiance 25 MG Tabs tablet Generic drug: empagliflozin Take 25 mg by mouth daily.   Lancets 28G  Misc 3 (three) times daily. Use as instructed. DX: E11.9   lisinopril-hydrochlorothiazide 20-12.5 MG tablet Commonly known as: ZESTORETIC TAKE 1 TABLET BY MOUTH DAILY   tadalafil 5 MG tablet Commonly known as: CIALIS Take 1 tablet (5 mg total) by mouth daily as needed for erectile dysfunction.   Trulicity 1.5 0000000 Sopn Generic drug: Dulaglutide Inject 1.5 mg into the skin every 7 (seven) days.       Allergies:  Allergies  Allergen Reactions  . Sulfa Antibiotics Rash    Penile rash    Family History: Family History  Problem Relation Age of Onset  . Early death Mother        MVA - died age 65   . Alcohol abuse Father   . Hypertension Father   . Cancer Father 49       renal cell carcinoma  . Heart disease Father 42       MI - died  . Diabetes Brother   . Sleep apnea Brother   . Sleep apnea Daughter   . Sleep apnea Brother   . Diabetes Maternal Uncle   . Diabetes Paternal Aunt   . Kidney disease Maternal Grandmother     Social History:  reports that he has never smoked. He has never used smokeless tobacco. He reports that he does not drink alcohol or use drugs.  ROS: UROLOGY Frequent Urination?: Yes Hard to postpone urination?: Yes Burning/pain with urination?: No Get up at night to urinate?: Yes Leakage of urine?: No Urine stream starts and stops?: No Trouble starting stream?: No Do you have to strain to urinate?: No Blood in urine?: No Urinary tract infection?: No Sexually transmitted disease?: No Injury to kidneys or bladder?: No Painful intercourse?: No Weak stream?: Yes Erection problems?: No Penile pain?: Yes  Gastrointestinal Nausea?: No Vomiting?: No Indigestion/heartburn?: No Diarrhea?: No Constipation?: No  Constitutional Fever: No Night sweats?: No Weight loss?: No Fatigue?: No  Skin Skin rash/lesions?: No Itching?: No  Eyes Blurred vision?: No Double vision?: No  Ears/Nose/Throat Sore throat?: No Sinus problems?: No  Hematologic/Lymphatic Swollen glands?: No Easy bruising?: No  Cardiovascular Leg swelling?: No Chest pain?: No  Respiratory Cough?: No Shortness of breath?: No  Endocrine Excessive thirst?: No  Musculoskeletal Back pain?: No Joint pain?: No  Neurological Headaches?: No Dizziness?: No  Psychologic Depression?: No Anxiety?: No  Physical Exam: BP 122/73   Pulse 94   Ht 5\' 7"  (1.702 m)   Wt 211 lb (95.7 kg)   BMI 33.05 kg/m   Constitutional:  Alert and oriented, No acute distress. HEENT: [place] AT, moist mucus membranes.  Trachea midline, no masses. Cardiovascular: No clubbing, cyanosis, or  edema. Respiratory: Normal respiratory effort, no increased work of breathing. Skin: No rashes, bruises or suspicious lesions. Neurologic: Grossly intact, no focal deficits, moving all 4 extremities. Psychiatric: Normal mood and affect.  Laboratory Data: Lab Results  Component Value Date   WBC 7.0 06/28/2018   HGB 15.4 06/28/2018   HCT 44.7 06/28/2018   MCV 92.5 06/28/2018   PLT 177 06/28/2018    Lab Results  Component Value Date   CREATININE 1.30 (H) 06/02/2018    Lab Results  Component Value Date   PSA 1.4 06/02/2018   PSA 0.47 05/13/2017    Lab Results  Component Value Date   HGBA1C 6.6 (A) 06/02/2018    Assessment & Plan:    1. BPH with obstruction/lower urinary tract symptoms Poorly controlled refractory symptoms with both irritative voiding symptoms despite finasteride  and daily Cialis  Cystoscopic evaluation reveals intravesical median lobe and evidence of prostatic regrowth.  We discussed the option of surgical management to address primarily his obstructive urinary symptoms.  We discussed various techniques including UroLift, TURP, holep amongst others.  Given the presence of the median lobe component, more strongly recommend holep versus traditional TURP.  Risk and benefits of each were discussed.  He is most interested holep with the goal of having an outpatient procedure.  We discussed alternatives including TURP vs. holmium laser enucleation of the prostate vs. greenlight laser ablation. Differences between the surgical procedures were discussed as well as the risks and benefits of each. He is most interested in HoLEP.  We discussed the common postoperative course following holep including need for overnight Foley catheter, temporary worsening of irritative voiding symptoms, and occasional stress incontinence which typically lasts up to 6 months but can persist. We discussed retrograde ejaculation and damage to surrounding structures including the urinary  sphincter. Other uncommon complications including hematuria and urinary tract infection.  He understands all of the above and is willing to proceed as planned.  [name], MD  [facility] [address], [place], [place] [address] [phone]

## 2019-05-11 ENCOUNTER — Other Ambulatory Visit: Payer: Self-pay | Admitting: Radiology

## 2019-05-15 ENCOUNTER — Other Ambulatory Visit: Payer: Self-pay | Admitting: Radiology

## 2019-05-15 DIAGNOSIS — N401 Enlarged prostate with lower urinary tract symptoms: Secondary | ICD-10-CM

## 2019-05-15 DIAGNOSIS — N320 Bladder-neck obstruction: Secondary | ICD-10-CM

## 2019-05-15 DIAGNOSIS — N138 Other obstructive and reflux uropathy: Secondary | ICD-10-CM

## 2019-05-16 ENCOUNTER — Encounter: Payer: Self-pay | Admitting: Nurse Practitioner

## 2019-05-16 ENCOUNTER — Ambulatory Visit (INDEPENDENT_AMBULATORY_CARE_PROVIDER_SITE_OTHER): Payer: BC Managed Care – PPO | Admitting: Nurse Practitioner

## 2019-05-16 ENCOUNTER — Other Ambulatory Visit: Payer: Self-pay

## 2019-05-16 ENCOUNTER — Ambulatory Visit (INDEPENDENT_AMBULATORY_CARE_PROVIDER_SITE_OTHER): Payer: BC Managed Care – PPO | Admitting: Cardiovascular Disease

## 2019-05-16 ENCOUNTER — Encounter: Payer: Self-pay | Admitting: Cardiovascular Disease

## 2019-05-16 ENCOUNTER — Telehealth: Payer: Self-pay | Admitting: Cardiovascular Disease

## 2019-05-16 ENCOUNTER — Telehealth: Payer: Self-pay

## 2019-05-16 VITALS — BP 112/57 | HR 94 | Ht 67.0 in | Wt 209.4 lb

## 2019-05-16 VITALS — BP 110/70 | HR 97 | Ht 67.0 in | Wt 210.0 lb

## 2019-05-16 DIAGNOSIS — G4733 Obstructive sleep apnea (adult) (pediatric): Secondary | ICD-10-CM | POA: Diagnosis not present

## 2019-05-16 DIAGNOSIS — Z01818 Encounter for other preprocedural examination: Secondary | ICD-10-CM

## 2019-05-16 DIAGNOSIS — I10 Essential (primary) hypertension: Secondary | ICD-10-CM

## 2019-05-16 DIAGNOSIS — Z01810 Encounter for preprocedural cardiovascular examination: Secondary | ICD-10-CM | POA: Diagnosis not present

## 2019-05-16 DIAGNOSIS — I493 Ventricular premature depolarization: Secondary | ICD-10-CM | POA: Diagnosis not present

## 2019-05-16 NOTE — Progress Notes (Signed)
Subjective:    Patient ID: Luke Coleman, male    DOB: 23-Oct-1950, 68 y.o.   MRN: [id number]  Aarnav Neglia is a 68 y.o. male presenting on 05/16/2019 for Medical Clearance (surgical clearance , HOLEP-LASER ENUCLEATION OF THE PROSTATE WITH MORCELLATION)   HPI Surgical clearance Patient scheduled for urologic prostate surgery on 05/29/2019.  He was asked to be seen for medical clearance for surgery due to abnormal EKG from 12/24/2016.  At that time, patient was in NSR with PVCs.  Closer evaluation reveals pentageminy.  - Patient currently exhibits no shortness of breath except when lying flat, no chest pain, no notable palpitations/tachycardia. - Patient has good exercise tolerance. - Patient has sleep apnea, but is not currently using his CPAP machine.  He needs replacement electrical cable, but needs new CPAP titration study as his last study was > 5 years ago.  Social History   Tobacco Use  . Smoking status: Never Smoker  . Smokeless tobacco: Never Used  Substance Use Topics  . Alcohol use: Yes    Alcohol/week: 0.0 standard drinks    Comment: Occasional  . Drug use: No    Review of Systems Per HPI unless specifically indicated above     Objective:    BP (!) 112/57 (BP Location: Right Arm, Patient Position: Sitting, Cuff Size: Normal)   Pulse 94   Ht 5\' 7"  (1.702 m)   Wt 209 lb 6.4 oz (95 kg)   BMI 32.80 kg/m   Wt Readings from Last 3 Encounters:  05/16/19 209 lb 6.4 oz (95 kg)  05/10/19 211 lb (95.7 kg)  04/25/19 208 lb (94.3 kg)    Physical Exam Vitals signs reviewed.  Constitutional:      General: He is not in acute distress.    Appearance: He is well-developed.  HENT:     Head: Normocephalic and atraumatic.  Neck:     Musculoskeletal: Normal range of motion and neck supple.  Cardiovascular:     Rate and Rhythm: Normal rate. Rhythm irregular.     Pulses: Normal pulses.     Heart sounds: Normal heart sounds.  Pulmonary:     Effort: Pulmonary effort is normal.      Breath sounds: Normal breath sounds.  Musculoskeletal:     Right lower leg: No edema.     Left lower leg: No edema.  Skin:    General: Skin is warm and dry.     Capillary Refill: Capillary refill takes less than 2 seconds.  Neurological:     General: No focal deficit present.     Mental Status: He is alert and oriented to person, place, and time. Mental status is at baseline.  Psychiatric:        Mood and Affect: Mood normal.        Behavior: Behavior normal.        Thought Content: Thought content normal.        Judgment: Judgment normal.    05/16/2019 EKG#1: NSR - but had limb lead reversal.   - No PVCs over about 18 seconds of EKG in chart.  Several EKGs were run from machine over 3 minute period that were all in NSR without PVCs.  Repeat EKG showed abnormal rhythm 05/16/2019 EKG#2: Sinus rhythm with PVCs  - Ventricular trigeminy and quadrigeminy noted on subsequent rhythm strip HR 91 bpm PR 158 ms QRS 90 ms QT 362 ms QTc 416 ms Left axis deviation  Results for orders placed or performed in visit on  04/25/19  Microscopic Examination   URINE  Result Value Ref Range   WBC, UA 0-5 0 - 5 /hpf   RBC None seen 0 - 2 /hpf   Epithelial Cells (non renal) 0-10 0 - 10 /hpf   Bacteria, UA None seen None seen/Few  Urinalysis, Complete  Result Value Ref Range   Specific Gravity, UA 1.020 1.005 - 1.030   pH, UA 5.0 5.0 - 7.5   Color, UA Yellow Yellow   Appearance Ur Clear Clear   Leukocytes,UA Negative Negative   Protein,UA Negative Negative/Trace   Glucose, UA 3+ (A) Negative   Ketones, UA Negative Negative   RBC, UA Negative Negative   Bilirubin, UA Negative Negative   Urobilinogen, Ur 0.2 0.2 - 1.0 mg/dL   Nitrite, UA Negative Negative   Microscopic Examination See below:       Assessment & Plan:   Problem List Items Addressed This Visit      Respiratory   Obstructive sleep apnea syndrome   Relevant Orders   Ambulatory referral to Sleep Studies    Other Visit  Diagnoses    Preoperative evaluation to rule out surgical contraindication    -  Primary   Relevant Orders   Ambulatory referral to Cardiology   Ventricular quadrigeminy        Patient has PVCs intermittently, but alternates between NSR and trigeminy/bigeminy as noted on EKGs performed in office today.  Patient is not consistently in this rhythm as noted by normal EKG and rhythm strip on initial EKG.  Likely okay for cardiac clearance, but will need cardiology review given history of Ventricular pentageminy in 2018 on EKG.  Plan: 1. Referral Cardiology for surgical clearance eval. 2. Monitor for symptoms of irregular HR, palpitations, chest pain, shortness of breath. 3. Optimize sleep apnea treatment to reduce risk for arrhythmia - sleep study referral placed today. 4. FOLLOW-UP prn and as scheduled for chronic disease management.   Follow up plan: Return if symptoms worsen or fail to improve.  [name], DNP, AGPCNP-BC Adult Gerontology Primary Care Nurse Practitioner [facility] 05/16/2019, 9:12 AM

## 2019-05-16 NOTE — Patient Instructions (Addendum)
[name],   Thank you for coming in to clinic today.  1. Your EKG shows normal heart rhythm today, but your repeat showed the same abnormal irregular heart rate as your 2018 EKG.  You will need cardiac evaluation to complete surgical clearance.  Please schedule a follow-up appointment with [name], AGNP. Return if symptoms worsen or fail to improve.  If you have any other questions or concerns, please feel free to call the clinic or send a message through [place]. You may also schedule an earlier appointment if necessary.  You will receive a survey after today's visit either digitally by e-mail or paper by [employer]. Your experiences and feedback matter to [name].  Please respond so we know how we are doing as we provide care for you.   [name], DNP, AGNP-BC Adult Gerontology Nurse Practitioner [place]

## 2019-05-16 NOTE — Progress Notes (Signed)
Cardiology Office Note  Date:  05/16/2019   ID:  Luke Coleman, DOB 1951/01/05, MRN [id number]  PCP:  [name], NP   Chief Complaint  Patient presents with  . New Patient (Initial Visit)    Patient needs pre op clearance Scheduled for 05/29/2019. Meds reviewed verbally with patient.     HPI:  Luke Coleman is a 68 year old gentleman with past medical history of diabetes BPH Hypertension Who presents by referral from [name] for consultation of his abnormal EKG, preop cardiovascular evaluation  Was seen today by primary care [place] to have abnormal EKG He is scheduled to have laser procedure of prostate May 29, 2019 with Dr.Brandon   He reports having significant nocturia likely exacerbated by his prostate Also CPAP is broken, has not slept well in quite some time  Review of prior EKGs shows PVCs in 2017, 2018 and again today with primary care and with our office  He is asymptomatic from his PVCs No prior cardiac history  Denies any symptoms concerning for angina, no significant shortness of breath on exertion  EKG personally reviewed by myself on todays visit Shows normal sinus rhythm with no significant ST-T wave changes, lone PVC   PMH:   has a past medical history of Acute bacterial sinusitis (08/22/2014), COPD (chronic obstructive pulmonary disease) ([place]), Diabetes mellitus without complication ([place]), Enlarged prostate, Essential (primary) hypertension (03/01/2013), HTN (hypertension) (03/01/2013), Hypertension, and Sleep apnea.  PSH:    Past Surgical History:  Procedure Laterality Date  . APPENDECTOMY    . CATARACT EXTRACTION W/PHACO Left 10/26/2018   Procedure: CATARACT EXTRACTION PHACO AND INTRAOCULAR LENS PLACEMENT ([place]) LEFT;  Surgeon: Eulogio Bear, MD;  Location: [facility] ORS;  Service: Ophthalmology;  Laterality: Left;  [name] 00:30.7 CDE 20.7 Fluid pack Lot # [id number]  . CIRCUMCISION  Age 58  . COLON SURGERY    . HERNIA REPAIR    . NASAL  SEPTUM SURGERY     For deviated septum  . SMALL INTESTINE SURGERY     Adhesions    Current Outpatient Medications  Medication Sig Dispense Refill  . Dulaglutide (TRULICITY) 1.5 0000000 SOPN Inject 1.5 mg into the skin every 7 (seven) days.     . empagliflozin (JARDIANCE) 25 MG TABS tablet Take 25 mg by mouth daily.    . finasteride (PROSCAR) 5 MG tablet Take 1 tablet (5 mg total) by mouth daily. 90 tablet 3  . fluticasone (FLONASE) 50 MCG/ACT nasal spray Place 2 sprays into both nostrils daily. 16 g 6  . glucose blood (ONE TOUCH ULTRA TEST) test strip USE TO TEST THREE TIMES DAILY 100 each 11  . ipratropium (ATROVENT) 0.06 % nasal spray USE 2 SPRAYS IN EACH NOSTRIL FOUR TIMES DAILY FOR UP TO 5 TO 7 DAYS THEN STOP 15 mL 0  . Lancets 28G MISC 3 (three) times daily. Use as instructed. DX: E11.9    . lisinopril-hydrochlorothiazide (ZESTORETIC) 20-12.5 MG tablet TAKE 1 TABLET BY MOUTH DAILY 90 tablet 1  . tadalafil (CIALIS) 5 MG tablet Take 1 tablet (5 mg total) by mouth daily as needed for erectile dysfunction. 90 tablet 3   No current facility-administered medications for this visit.      Allergies:   Sulfa antibiotics   Social History:  The patient  reports that he has never smoked. He has never used smokeless tobacco. He reports current alcohol use. He reports that he does not use drugs.   Family History:   family history includes Alcohol abuse  in his father; Cancer (age of onset: 72) in his father; Diabetes in his brother, maternal uncle, and paternal aunt; Early death in his mother; Heart disease (age of onset: 40) in his father; Hypertension in his father; Kidney disease in his maternal grandmother; Sleep apnea in his brother, brother, and daughter.    Review of Systems: Review of Systems  Constitutional: Negative.   HENT: Negative.   Respiratory: Negative.   Cardiovascular: Negative.   Gastrointestinal: Negative.   Genitourinary: Positive for frequency.  Musculoskeletal:  Negative.   Neurological: Negative.   Psychiatric/Behavioral: The patient has insomnia.   All other systems reviewed and are negative.   PHYSICAL EXAM: VS:  BP 110/70 (BP Location: Right Arm, Patient Position: Sitting, Cuff Size: Normal)   Pulse 97   Ht 5\' 7"  (1.702 m)   Wt 210 lb (95.3 kg)   BMI 32.89 kg/m  , BMI Body mass index is 32.89 kg/m. GEN: Well nourished, well developed, in no acute distress HEENT: normal Neck: no JVD, carotid bruits, or masses Cardiac: RRR; no murmurs, rubs, or gallops,no edema  Respiratory:  clear to auscultation bilaterally, normal work of breathing GI: soft, nontender, nondistended, + BS MS: no deformity or atrophy Skin: warm and dry, no rash Neuro:  Strength and sensation are intact Psych: euthymic mood, full affect   Recent Labs: 06/02/2018: ALT 31; BUN 23; Creat 1.30; Potassium 4.5; Sodium 138 06/28/2018: Hemoglobin 15.4; Platelets 177    Lipid Panel Lab Results  Component Value Date   CHOL 134 06/02/2018   HDL 34 (L) 06/02/2018   LDLCALC 74 06/02/2018   TRIG 157 (H) 06/02/2018      Wt Readings from Last 3 Encounters:  05/16/19 210 lb (95.3 kg)  05/16/19 209 lb 6.4 oz (95 kg)  05/10/19 211 lb (95.7 kg)     ASSESSMENT AND PLAN:  Problem List Items Addressed This Visit      Cardiology Problems   Essential (primary) hypertension   Relevant Orders   EKG 12-Lead     Other   Obstructive sleep apnea syndrome - Primary   Relevant Orders   Ambulatory referral to Pulmonology    Other Visit Diagnoses    PVC's (premature ventricular contractions)       Preop cardiovascular exam         PVCs Benign finding, dating back to 2017 on EKGs No significant cardiac history Asymptomatic, no anginal symptoms No further work-up needed at this time  Obstructive sleep apnea He reports CPAP machine 67 years old and is broken, we have placed a referral to pulmonary May need repeat sleep study  Essential hypertension Blood pressure  running low He will monitor blood pressure at home Suspect he may be able to cut his lisinopril HCTZ in half daily  Disposition:   F/U  12 months   Total encounter time more than 45 minutes  Greater than 50% was spent in counseling and coordination of care with the patient    Signed, Esmond Plants, M.D., Ph.D. [place], [place]

## 2019-05-16 NOTE — Telephone Encounter (Signed)
° °   [facility] Pre-operative Risk Assessment    Request for surgical clearance:  1. What type of surgery is being performed? Holmium laser evaluation of the prostate   2. When is this surgery scheduled? 05/29/19  3. What type of clearance is required (medical clearance vs. Pharmacy clearance to hold med vs. Both)? Medical   4. Are there any medications that need to be held prior to surgery and how long? Not noted   5. Practice name and name of physician performing surgery?  [facility] Dr. Erlene Quan   6. What is your office phone number [phone]   7.   What is your office fax number   [phone]  8.   Anesthesia type (None, local, MAC, general) ? Not noted    Clarisse Gouge 05/16/2019, 1:54 PM  _________________________________________________________________   (provider comments below)

## 2019-05-16 NOTE — Patient Instructions (Addendum)
Yopu are having rare PVCs (premature ventricular contraction) No further workup needed  Referral to pulmonary for OSA/CPAP  Monitor BP If it runs low, you might need a 1/2 pill of the lisinopril/HCTZ   Medication Instructions:  No changes  If you need a refill on your cardiac medications before your next appointment, please call your pharmacy.    Lab work: No new labs needed   If you have labs (blood work) drawn today and your tests are completely normal, you will receive your results only by: Marland Kitchen MyChart Message (if you have MyChart) OR . A paper copy in the mail If you have any lab test that is abnormal or we need to change your treatment, we will call you to review the results.   Testing/Procedures: No new testing needed   Follow-Up: At [facility], you and your health needs are our priority.  As part of our continuing mission to provide you with exceptional heart care, we have created designated Provider Care Teams.  These Care Teams include your primary Cardiologist (physician) and Advanced Practice Providers (APPs -  Physician Assistants and Nurse Practitioners) who all work together to provide you with the care you need, when you need it.  . You will need a follow up appointment as needed  . Providers on your designated Care Team:   . Murray Hodgkins, NP . Christell Faith, PA-C . Marrianne Mood, PA-C  Any Other Special Instructions Will Be Listed Below (If Applicable).  For educational health videos Log in to : [email or web address] Or : [email or web address], password : [email or web address]

## 2019-05-17 ENCOUNTER — Telehealth: Payer: BC Managed Care – PPO | Admitting: Cardiovascular Disease

## 2019-05-17 NOTE — Telephone Encounter (Signed)
Faxed to requesting office. 

## 2019-05-17 NOTE — Telephone Encounter (Signed)
   Primary Cardiologist: Ida Rogue, MD  Chart reviewed as part of pre-operative protocol coverage. Given past medical history and time since last visit, based on ACC/AHA guidelines, Salathiel Ransier would be at acceptable risk for the planned procedure without further cardiovascular testing.   I will route this recommendation to the requesting party via Epic fax function and remove from pre-op pool.  Please call with questions.  Patient was seen and cleared by Dr. Rockey Situ yesterday. Callback pool to forward Dr. Donivan Scull note to requesting provider  Almyra Deforest, PA 05/17/2019, 1:58 PM

## 2019-05-18 ENCOUNTER — Other Ambulatory Visit: Payer: Self-pay

## 2019-05-18 DIAGNOSIS — N138 Other obstructive and reflux uropathy: Secondary | ICD-10-CM

## 2019-05-19 ENCOUNTER — Other Ambulatory Visit: Payer: Self-pay

## 2019-05-19 ENCOUNTER — Other Ambulatory Visit: Payer: Self-pay | Admitting: Radiology

## 2019-05-19 ENCOUNTER — Other Ambulatory Visit: Payer: BC Managed Care – PPO

## 2019-05-19 DIAGNOSIS — N138 Other obstructive and reflux uropathy: Secondary | ICD-10-CM

## 2019-05-19 LAB — URINALYSIS, COMPLETE
Bilirubin, UA: NEGATIVE
Ketones, UA: NEGATIVE
Leukocytes,UA: NEGATIVE
Nitrite, UA: NEGATIVE
Protein,UA: NEGATIVE
RBC, UA: NEGATIVE
Specific Gravity, UA: 1.015 (ref 1.005–1.030)
Urobilinogen, Ur: 0.2 mg/dL (ref 0.2–1.0)
pH, UA: 5 (ref 5.0–7.5)

## 2019-05-19 LAB — MICROSCOPIC EXAMINATION
Bacteria, UA: NONE SEEN
Epithelial Cells (non renal): NONE SEEN /hpf (ref 0–10)
RBC, Urine: NONE SEEN /hpf (ref 0–2)
WBC, UA: NONE SEEN /hpf (ref 0–5)

## 2019-05-22 LAB — CULTURE, URINE COMPREHENSIVE

## 2019-05-25 ENCOUNTER — Other Ambulatory Visit: Payer: Self-pay

## 2019-05-25 ENCOUNTER — Encounter
Admission: RE | Admit: 2019-05-25 | Discharge: 2019-05-25 | Disposition: A | Discharge status: Home / Self Care | Payer: BC Managed Care – PPO | Source: Ambulatory Visit | Attending: Urology | Admitting: Urology

## 2019-05-25 DIAGNOSIS — Z01818 Encounter for other preprocedural examination: Secondary | ICD-10-CM | POA: Insufficient documentation

## 2019-05-25 DIAGNOSIS — Z20828 Contact with and (suspected) exposure to other viral communicable diseases: Secondary | ICD-10-CM | POA: Diagnosis not present

## 2019-05-25 LAB — BASIC METABOLIC PANEL
Anion gap: 8 (ref 5–15)
BUN: 26 mg/dL — ABNORMAL HIGH (ref 8–23)
CO2: 23 mmol/L (ref 22–32)
Calcium: 8.8 mg/dL — ABNORMAL LOW (ref 8.9–10.3)
Chloride: 107 mmol/L (ref 98–111)
Creatinine, Ser: 1.28 mg/dL — ABNORMAL HIGH (ref 0.61–1.24)
GFR calc Af Amer: >60 mL/min (ref >60)
GFR calc non Af Amer: 58 mL/min — ABNORMAL LOW (ref >60)
Glucose, Bld: 133 mg/dL — ABNORMAL HIGH (ref 70–99)
Potassium: 4.1 mmol/L (ref 3.5–5.1)
Sodium: 138 mmol/L (ref 135–145)

## 2019-05-25 LAB — CBC
HCT: 45.3 % (ref 39.0–52.0)
Hemoglobin: 15.4 g/dL (ref 13.0–17.0)
MCH: 31.5 pg (ref 26.0–34.0)
MCHC: 34 g/dL (ref 30.0–36.0)
MCV: 92.6 fL (ref 80.0–100.0)
Platelets: 180 10*3/uL (ref 150–400)
RBC: 4.89 MIL/uL (ref 4.22–5.81)
RDW: 13.2 % (ref 11.5–15.5)
WBC: 8.1 10*3/uL (ref 4.0–10.5)
nRBC: 0 % (ref 0.0–0.2)

## 2019-05-25 LAB — SARS CORONAVIRUS 2 (TAT 6-24 HRS): SARS Coronavirus 2: NEGATIVE

## 2019-05-25 NOTE — Patient Instructions (Signed)
Your procedure is scheduled on: Monday May 29, 2019 Report to Day Surgery on the 2nd floor of the [facility]. To find out your arrival time, please call [phone] between 1PM - 3PM on: Friday May 26, 2019  REMEMBER: Instructions that are not followed completely may result in serious medical risk, up to and including death; or upon the discretion of your surgeon and anesthesiologist your surgery may need to be rescheduled.  Do not eat food after midnight the night before surgery.  No gum chewing, lozengers or hard candies.  You may however, drink CLEAR liquids up to 2 hours before you are scheduled to arrive for your surgery. Do not drink anything within 2 hours of the start of your surgery.  Clear liquids include: - water   Do NOT drink anything that is not on this list.  Type 1 and Type 2 diabetics should only drink water.  No Alcohol for 24 hours before or after surgery.  No Smoking including e-cigarettes for 24 hours prior to surgery.  No chewable tobacco products for at least 6 hours prior to surgery.  No nicotine patches on the day of surgery.  On the morning of surgery brush your teeth with toothpaste and water, you may rinse your mouth with mouthwash if you wish. Do not swallow any toothpaste or mouthwash.  Notify your doctor if there is any change in your medical condition (cold, fever, infection).  Do not wear jewelry, make-up, hairpins, clips or nail polish.  Do not wear lotions, powders, or perfumes.   Do not shave 48 hours prior to surgery.   Contacts and dentures may not be worn into surgery.  Do not bring valuables to the hospital, including drivers license, insurance or credit cards.  [facility] is not responsible for any belongings or valuables.   TAKE THESE MEDICATIONS THE MORNING OF SURGERY: FINASTERIDE  SHOWER MORNING OF SURGERY  Follow recommendations from Cardiologist, Pulmonologist or PCP regarding stopping Aspirin,   Stop  Anti-inflammatories (NSAIDS) such as Advil, Aleve, Ibuprofen, Motrin, Naproxen, Naprosyn and Aspirin based products such as Excedrin, Goodys Powder, BC Powder. (May take Tylenol or Acetaminophen if needed.)  Stop ANY OVER THE COUNTER supplements until after surgery. (May continue Vitamin D, Vitamin B, and multivitamin.)  Wear comfortable clothing (specific to your surgery type) to the hospital.  If you are being discharged the day of surgery, you will not be allowed to drive home. You will need a responsible adult to drive you home and stay with you that night.   If you are taking public transportation, you will need to have a responsible adult with you. Please confirm with your physician that it is acceptable to use public transportation.   Please call [phone] if you have any questions about these instructions.

## 2019-05-29 ENCOUNTER — Other Ambulatory Visit: Payer: Self-pay

## 2019-05-29 ENCOUNTER — Ambulatory Visit
Admission: RE | Admit: 2019-05-29 | Discharge: 2019-05-29 | Disposition: A | Discharge status: Home / Self Care | Payer: BC Managed Care – PPO | Attending: Urology | Admitting: Urology

## 2019-05-29 ENCOUNTER — Encounter
Admission: RE | Disposition: A | Discharge status: Home / Self Care | Payer: Self-pay | Source: Home / Self Care | Attending: Urology

## 2019-05-29 ENCOUNTER — Ambulatory Visit: Payer: BC Managed Care – PPO | Admitting: Anesthesiology

## 2019-05-29 DIAGNOSIS — N320 Bladder-neck obstruction: Secondary | ICD-10-CM

## 2019-05-29 DIAGNOSIS — Z79899 Other long term (current) drug therapy: Secondary | ICD-10-CM | POA: Insufficient documentation

## 2019-05-29 DIAGNOSIS — N3289 Other specified disorders of bladder: Secondary | ICD-10-CM | POA: Insufficient documentation

## 2019-05-29 DIAGNOSIS — I10 Essential (primary) hypertension: Secondary | ICD-10-CM | POA: Diagnosis not present

## 2019-05-29 DIAGNOSIS — N401 Enlarged prostate with lower urinary tract symptoms: Secondary | ICD-10-CM | POA: Insufficient documentation

## 2019-05-29 DIAGNOSIS — R350 Frequency of micturition: Secondary | ICD-10-CM | POA: Diagnosis not present

## 2019-05-29 DIAGNOSIS — N138 Other obstructive and reflux uropathy: Secondary | ICD-10-CM | POA: Insufficient documentation

## 2019-05-29 DIAGNOSIS — G4730 Sleep apnea, unspecified: Secondary | ICD-10-CM | POA: Insufficient documentation

## 2019-05-29 DIAGNOSIS — J449 Chronic obstructive pulmonary disease, unspecified: Secondary | ICD-10-CM | POA: Diagnosis not present

## 2019-05-29 DIAGNOSIS — E119 Type 2 diabetes mellitus without complications: Secondary | ICD-10-CM | POA: Insufficient documentation

## 2019-05-29 DIAGNOSIS — R3912 Poor urinary stream: Secondary | ICD-10-CM | POA: Insufficient documentation

## 2019-05-29 DIAGNOSIS — R3914 Feeling of incomplete bladder emptying: Secondary | ICD-10-CM | POA: Insufficient documentation

## 2019-05-29 DIAGNOSIS — Z882 Allergy status to sulfonamides status: Secondary | ICD-10-CM | POA: Insufficient documentation

## 2019-05-29 DIAGNOSIS — Z7984 Long term (current) use of oral hypoglycemic drugs: Secondary | ICD-10-CM | POA: Diagnosis not present

## 2019-05-29 DIAGNOSIS — R3915 Urgency of urination: Secondary | ICD-10-CM | POA: Diagnosis not present

## 2019-05-29 HISTORY — PX: HOLEP-LASER ENUCLEATION OF THE PROSTATE WITH MORCELLATION: SHX6641

## 2019-05-29 LAB — GLUCOSE, CAPILLARY
Glucose-Capillary: 128 mg/dL — ABNORMAL HIGH (ref 70–99)
Glucose-Capillary: 94 mg/dL (ref 70–99)

## 2019-05-29 SURGERY — ENUCLEATION, PROSTATE, USING LASER, WITH MORCELLATION
Anesthesia: General | Site: Prostate

## 2019-05-29 MED ORDER — ONDANSETRON HCL 4 MG/2ML IJ SOLN: 4.0000 mg | Freq: Once | INTRAMUSCULAR | Status: DC | PRN

## 2019-05-29 MED ORDER — PROPOFOL 10 MG/ML IV BOLUS
INTRAVENOUS | Status: DC | PRN
  Administered 2019-05-29: 200 mg via INTRAVENOUS

## 2019-05-29 MED ORDER — LACTATED RINGERS IV SOLN
INTRAVENOUS | Status: DC | PRN
  Administered 2019-05-29: 10:00:00 via INTRAVENOUS

## 2019-05-29 MED ORDER — MIDAZOLAM HCL 2 MG/2ML IJ SOLN
2.0000 mg | Freq: Once | INTRAMUSCULAR | Status: AC
  Administered 2019-05-29: 09:00:00 2 mg via INTRAVENOUS

## 2019-05-29 MED ORDER — FENTANYL CITRATE (PF) 100 MCG/2ML IJ SOLN
25.0000 ug | INTRAMUSCULAR | Status: DC | PRN
  Administered 2019-05-29 (×4): 25 ug via INTRAVENOUS

## 2019-05-29 MED ORDER — MIDAZOLAM HCL 2 MG/2ML IJ SOLN
INTRAMUSCULAR | Status: AC
  Filled 2019-05-29: qty 2

## 2019-05-29 MED ORDER — ONDANSETRON HCL 4 MG/2ML IJ SOLN
INTRAMUSCULAR | Status: DC | PRN
  Administered 2019-05-29: 4 mg via INTRAVENOUS

## 2019-05-29 MED ORDER — PHENYLEPHRINE HCL (PRESSORS) 10 MG/ML IV SOLN
INTRAVENOUS | Status: DC | PRN
  Administered 2019-05-29 (×4): 100 ug via INTRAVENOUS

## 2019-05-29 MED ORDER — FAMOTIDINE 20 MG PO TABS
ORAL_TABLET | ORAL | Status: AC
  Filled 2019-05-29: qty 1

## 2019-05-29 MED ORDER — DOCUSATE SODIUM 100 MG PO CAPS: 100.0000 mg | ORAL_CAPSULE | Freq: Two times a day (BID) | ORAL | 0 refills | Status: DC

## 2019-05-29 MED ORDER — SODIUM CHLORIDE 0.9 % IV SOLN
INTRAVENOUS | Status: DC
  Administered 2019-05-29: 50 mL/h via INTRAVENOUS

## 2019-05-29 MED ORDER — HYDROCODONE-ACETAMINOPHEN 5-325 MG PO TABS: 1.0000 | ORAL_TABLET | Freq: Four times a day (QID) | ORAL | 0 refills | Status: DC | PRN

## 2019-05-29 MED ORDER — CEFAZOLIN SODIUM-DEXTROSE 2-4 GM/100ML-% IV SOLN
2.0000 g | INTRAVENOUS | Status: AC
  Administered 2019-05-29: 2 g via INTRAVENOUS

## 2019-05-29 MED ORDER — SUCCINYLCHOLINE CHLORIDE 20 MG/ML IJ SOLN
INTRAMUSCULAR | Status: DC | PRN
  Administered 2019-05-29: 100 mg via INTRAVENOUS

## 2019-05-29 MED ORDER — FENTANYL CITRATE (PF) 100 MCG/2ML IJ SOLN
INTRAMUSCULAR | Status: AC
  Administered 2019-05-29: 11:00:00 25 ug via INTRAVENOUS
  Filled 2019-05-29: qty 2

## 2019-05-29 MED ORDER — FUROSEMIDE 10 MG/ML IJ SOLN
INTRAMUSCULAR | Status: DC | PRN
  Administered 2019-05-29: 10 mg via INTRAMUSCULAR

## 2019-05-29 MED ORDER — FAMOTIDINE 20 MG PO TABS
20.0000 mg | ORAL_TABLET | Freq: Once | ORAL | Status: AC
  Administered 2019-05-29: 08:00:00 20 mg via ORAL

## 2019-05-29 MED ORDER — SUGAMMADEX SODIUM 200 MG/2ML IV SOLN
INTRAVENOUS | Status: DC | PRN
  Administered 2019-05-29: 200 mg via INTRAVENOUS

## 2019-05-29 MED ORDER — FENTANYL CITRATE (PF) 100 MCG/2ML IJ SOLN
INTRAMUSCULAR | Status: DC | PRN
  Administered 2019-05-29: 100 ug via INTRAVENOUS

## 2019-05-29 MED ORDER — CEFAZOLIN SODIUM-DEXTROSE 2-4 GM/100ML-% IV SOLN
INTRAVENOUS | Status: AC
  Filled 2019-05-29: qty 100

## 2019-05-29 MED ORDER — ROCURONIUM BROMIDE 100 MG/10ML IV SOLN
INTRAVENOUS | Status: DC | PRN
  Administered 2019-05-29: 5 mg via INTRAVENOUS
  Administered 2019-05-29: 45 mg via INTRAVENOUS

## 2019-05-29 SURGICAL SUPPLY — 32 items
ADAPTER IRRIG TUBE 2 SPIKE SOL (ADAPTER) ×4 IMPLANT
BAG URINE DRAINAGE (UROLOGICAL SUPPLIES) IMPLANT
BAG URO DRAIN 4000ML (MISCELLANEOUS) IMPLANT
CATH FOL 2WAY LX 20X30 (CATHETERS) ×1 IMPLANT
CATH FOL 2WAY LX 22X30 (CATHETERS) IMPLANT
CATH FOLEY 3WAY 30CC 22FR (CATHETERS) IMPLANT
CATH URETL 5X70 OPEN END (CATHETERS) ×2 IMPLANT
CONTAINER COLLECT MORCELLATR (MISCELLANEOUS) ×1 IMPLANT
COVER WAND RF STERILE (DRAPES) ×2 IMPLANT
DRAPE 3/4 80X56 (DRAPES) ×2 IMPLANT
DRAPE UTILITY 15X26 TOWEL STRL (DRAPES) IMPLANT
FIBER LASER FLEXIVA 550 (UROLOGICAL SUPPLIES) ×1 IMPLANT
FILTER OVERFLOW MORCELLATOR (FILTER) ×1 IMPLANT
GLOVE BIO SURGEON STRL SZ 6.5 (GLOVE) ×4 IMPLANT
GOWN STRL REUS W/ TWL LRG LVL3 (GOWN DISPOSABLE) ×2 IMPLANT
GOWN STRL REUS W/TWL LRG LVL3 (GOWN DISPOSABLE) ×2
GUIDEWIRE STR DUAL SENSOR (WIRE) IMPLANT
HOLDER FOLEY CATH W/STRAP (MISCELLANEOUS) ×2 IMPLANT
KIT TURNOVER CYSTO (KITS) ×2 IMPLANT
LASER FIBER 550M SMARTSCOPE (Laser) ×2 IMPLANT
MORCELLATOR COLLECT CONTAINER (MISCELLANEOUS) ×2
MORCELLATOR OVERFLOW FILTER (FILTER) ×2
MORCELLATOR ROTATION 4.75 335 (MISCELLANEOUS) ×2 IMPLANT
PACK CYSTO AR (MISCELLANEOUS) ×2 IMPLANT
SET CYSTO W/LG BORE CLAMP LF (SET/KITS/TRAYS/PACK) IMPLANT
SET IRRIG Y TYPE TUR BLADDER L (SET/KITS/TRAYS/PACK) ×2 IMPLANT
SLEEVE PROTECTION STRL DISP (MISCELLANEOUS) ×4 IMPLANT
SOL .9 NS 3000ML IRR  AL (IV SOLUTION) ×4
SOL .9 NS 3000ML IRR UROMATIC (IV SOLUTION) ×4 IMPLANT
SYRINGE IRR TOOMEY STRL 70CC (SYRINGE) ×2 IMPLANT
TUBE PUMP MORCELLATOR PIRANHA (TUBING) ×2 IMPLANT
WATER STERILE IRR 1000ML POUR (IV SOLUTION) ×2 IMPLANT

## 2019-05-29 NOTE — Transfer of Care (Signed)
Immediate Anesthesia Transfer of Care Note  Patient: Luke Coleman  Procedure(s) Performed: HOLEP-LASER ENUCLEATION OF THE PROSTATE WITH MORCELLATION (N/A Prostate)  Patient Location: PACU  Anesthesia Type:General  Level of Consciousness: awake  Airway & Oxygen Therapy: Patient Spontanous Breathing  Post-op Assessment: Report given to RN  Post vital signs: stable  Last Vitals:  Vitals Value Taken Time  BP 149/95 05/29/19 1043  Temp    Pulse 51 05/29/19 1044  Resp 13 05/29/19 1044  SpO2 99 % 05/29/19 1044  Vitals shown include unvalidated device data.  Last Pain:  Vitals:   05/29/19 0739  TempSrc: Tympanic  PainSc: 0-No pain         Complications: No apparent anesthesia complications

## 2019-05-29 NOTE — Interval H&P Note (Signed)
History and Physical Interval Note:  05/29/2019 8:37 AM  Luke Coleman  has presented today for surgery, with the diagnosis of BPH with bladder outlet obstruction.  The various methods of treatment have been discussed with the patient and family. After consideration of risks, benefits and other options for treatment, the patient has consented to  Procedure(s): Spiro WITH MORCELLATION (N/A) as a surgical intervention.  The patient's history has been reviewed, patient examined, no change in status, stable for surgery.  I have reviewed the patient's chart and labs.  Questions were answered to the patient's satisfaction.    RRR CTAB  [name]

## 2019-05-29 NOTE — Anesthesia Procedure Notes (Signed)
Procedure Name: Intubation Date/Time: 05/29/2019 9:24 AM Performed by: Leander Rams, CRNA Pre-anesthesia Checklist: Patient identified, Emergency Drugs available, Suction available, Patient being monitored and Timeout performed Patient Re-evaluated:Patient Re-evaluated prior to induction Oxygen Delivery Method: Circle system utilized Preoxygenation: Pre-oxygenation with 100% oxygen Induction Type: IV induction Ventilation: Mask ventilation without difficulty Laryngoscope Size: McGraph and 4 Grade View: Grade I Tube type: Oral Tube size: 7.5 mm Number of attempts: 1 Placement Confirmation: ETT inserted through vocal cords under direct vision Secured at: 24 cm Tube secured with: Tape Dental Injury: Teeth and Oropharynx as per pre-operative assessment

## 2019-05-29 NOTE — Op Note (Signed)
Date of procedure: 05/29/19  Preoperative diagnosis:  1. BPH with BOO  Postoperative diagnosis:  1. same   Procedure: 1. HoLEP with morcellation  Surgeon: [name], MD  Anesthesia: General  Complications: None  Intraoperative findings: Trilobar coaptation with a very tight prostatic fossa, elevated bladder neck with small intravesical protrusion of median lobe.  Trabeculated bladder.  UOs visualized after resection, free of any injury with clear reflux.  EBL: Minimal  Specimens: Prostate chips  Drains: 20 French two-way Foley catheter with 30 cc balloon  Indication: Luke Coleman is a 68 y.o. patient with personal history of BPH with bladder outlet obstruction status post remote history of TURP with prostatic regrowth, symptomatic.Marland Kitchen  After reviewing the management options for treatment, he elected to proceed with the above surgical procedure(s). We have discussed the potential benefits and risks of the procedure, side effects of the proposed treatment, the likelihood of the patient achieving the goals of the procedure, and any potential problems that might occur during the procedure or recuperation. Informed consent has been obtained.  Description of procedure:  The patient was taken to the operating room and general anesthesia was induced.  The patient was placed in the dorsal lithotomy position, prepped and draped in the usual sterile fashion, and preoperative antibiotics were administered. A preoperative time-out was performed.     A 26 French resectoscope sheath using a blunt angled obturator was introduced without difficulty into the bladder.  The bladder was carefully inspected and noted to be moderately trabeculated.  There is an elevated bladder neck with a very small intravesical component.  The trigone was able to be visualized with some manipulation and the UOs were a good distance from bladder neck itself.  The prostatic fossa had significant trilobar coaptation with  greater than 5 cm prostatic length.  A 550 m laser fiber was then brought in and using settings of 0.9 J's and 53 Hz, 2 incisions were created at the 5:00 and 7:00 positions of the bladder neck on either side of the median lobe down to the level of the bladder neck/capsular fibers.  The incision was carried down caudally meeting in the midline just above the verumontanum.  The median lobe was then enucleated from a caudal to cranial direction cleaving the adenoma off the underlying capsule rolling it towards the bladder neck and ultimately cleaving the mucosa to free the median lobe into the bladder.   Next, a semilunar incision was created at the prostatic apex on the left side again freeing up the adenoma from the underlying capsule.  Care was taken to avoid any resection past the verumontanum.  This incision was carried around laterally and cranially towards the bladder neck.      Next, the same similar incision was created at the right prostatic apex.  This adenoma however ended up being enucleated and more of a piece wise fashion freeing up a large BPH nodules from the capsular fibers.   These 2 incisions met midline at the anterior commissure which was incised and the entire adenoma  en bloc aside from the previously resected median lobe.  Once this was completed and cleared from the bladder neck, the prostatic fossa was noted to be widely patent.  A few small residual BPH nodules on the left lateral lobe were enucleated down to level the capsular fibers. Hemostasis was achieved using hemostatic fiber settings.  Bilateral UOs were visualized and free of any injury.  Finally, the 100 French resectoscope was exchanged for nephroscope and  using the Piranha handpiece morcellator, the bladder was distended in each of the prostate chips were evacuated.  The bladder was irrigated several times and smaller joules were clear for the bladder.  This point time, there were no residual fibers appreciated in the  bladder.  Initially there was a good amount of bleeding but ultimately this subsided. Hemostasis was adequate.  10 mg of IV Lasix was administered to help with postoperative diuresis.  A 20 French two-way Foley catheter was then inserted over a catheter guide with 30 cc in the balloon.    With some traction on the catheter, all bleeding stopped.  The catheter irrigated easily and well.  Patient was then clean and dry, repositioned supine position, reversed from anesthesia, taken to PACU in stable condition.   Plan: Patient will return to the office in 2 days for voiding trial.      [name], M.D.

## 2019-05-29 NOTE — Anesthesia Preprocedure Evaluation (Signed)
Anesthesia Evaluation  Patient identified by MRN, date of birth, ID band Patient awake    Reviewed: Allergy & Precautions, H&P , NPO status , Patient's Chart, lab work & pertinent test results, reviewed documented beta blocker date and time   Airway Mallampati: II  TM Distance: >3 FB Neck ROM: full    Dental  (+) Teeth Intact   Pulmonary sleep apnea and Continuous Positive Airway Pressure Ventilation , COPD,  COPD inhaler,    Pulmonary exam normal        Cardiovascular Exercise Tolerance: Good hypertension, On Medications negative cardio ROS Normal cardiovascular exam Rhythm:regular Rate:Normal     Neuro/Psych negative neurological ROS  negative psych ROS   GI/Hepatic negative GI ROS, Neg liver ROS,   Endo/Other  diabetes, Well Controlled, Type 2  Renal/GU negative Renal ROS  negative genitourinary   Musculoskeletal   Abdominal   Peds  Hematology negative hematology ROS (+)   Anesthesia Other Findings Past Medical History: 08/22/2014: Acute bacterial sinusitis No date: COPD (chronic obstructive pulmonary disease) ([facility]) No date: Diabetes mellitus without complication ([place]) No date: Enlarged prostate 03/01/2013: Essential (primary) hypertension     Comment:  Overview:  Last Assessment & Plan:  Taking meds as               prescribed. Check bmet. Continue to follow.  03/01/2013: HTN (hypertension) No date: Hypertension No date: Sleep apnea     Comment:  on CPAP, USUALLY...CURRENTLY BROKEN Past Surgical History: No date: APPENDECTOMY 10/26/2018: CATARACT EXTRACTION W/PHACO; Left     Comment:  Procedure: CATARACT EXTRACTION PHACO AND INTRAOCULAR               LENS PLACEMENT ([place]) LEFT;  Surgeon: Eulogio Bear,               MD;  Location: [facility] ORS;  Service: Ophthalmology;                Laterality: Left;  [name] 00:30.7 CDE 20.7 Fluid pack Lot #              [id number] H Age 24: CIRCUMCISION No date: [place]: EYE SURGERY; Right     Comment:  correct lazy eye No date: HERNIA REPAIR No date: NASAL SEPTUM SURGERY     Comment:  For deviated septum No date: SMALL INTESTINE SURGERY     Comment:  Adhesions BMI    Body Mass Index: 32.11 kg/m     Reproductive/Obstetrics negative OB ROS                             Anesthesia Physical Anesthesia Plan  ASA: III  Anesthesia Plan: General ETT   Post-op Pain Management:    Induction:   PONV Risk Score and Plan: 3  Airway Management Planned:   Additional Equipment:   Intra-op Plan:   Post-operative Plan:   Informed Consent: I have reviewed the patients History and Physical, chart, labs and discussed the procedure including the risks, benefits and alternatives for the proposed anesthesia with the patient or authorized representative who has indicated his/her understanding and acceptance.     Dental Advisory Given  Plan Discussed with: CRNA  Anesthesia Plan Comments:         Anesthesia Quick Evaluation

## 2019-05-29 NOTE — Discharge Instructions (Signed)
Luke Coleman Laser Prostate Treatment, Care After This sheet gives you information about how to care for yourself after your procedure. Your health care provider may also give you more specific instructions. If you have problems or questions, contact your health care provider. What can I expect after the procedure? After the procedure, it is common to have:  Swelling and discomfort around your urethra. The opening of the urethra is at the end of the penis.  Blood in your urine. This should go away after a few days.  Trouble urinating or sudden need to urinate (urgency). These problems should get better over time. You may continue to have a thin tube (catheter) inserted into your urethra to help drain your urine from your bladder for a few days after the procedure. Follow these instructions at home: Medicines  Take over-the-counter and prescription medicines only as told by your health care provider.  If you were prescribed an antibiotic medicine, take it as told by your health care provider. Do not stop taking the antibiotic even if you start to feel better. Bathing  Do not take baths, swim, or use a hot tub until your health care provider approves. Ask your health care provider if you may take showers. You may only be allowed to take sponge baths. Activity   Do not drive for 24 hours if you were given a medicine to help you relax (sedative) during your procedure.  Do not drive or use heavy machinery while taking prescription pain medicine.  Ask your health care provider what activities are safe for you. Most people can return to normal activities within a few days. ? Do not have sex or engage in sexual activity until your health care provider approves. ? Do not lift anything that is heavier than 10 lb (4.5 kg), or the limit that you are told, until your health care provider says that it is safe. General instructions      If you have a urinary catheter, care for it as told by  your health care provider. This may include: ? Washing your hands before and after touching the catheter. ? Emptying your drainage bag when it is ?- full, or emptying it at least 2-3 times a day. ? Keeping the area around the catheter clean and dry. ? Avoiding any bends or breaks in the catheter. ? Keeping air out of the catheter. ? Making sure that the catheter is not placed under water.  Do not use any products that contain nicotine or tobacco, such as cigarettes and e-cigarettes. If you need help quitting, ask your health care provider.  Drink enough fluid to keep your urine pale yellow.  Keep all follow-up visits as told by your health care provider. This is important. Contact a health care provider if:  You have trouble: ? Having a bowel movement. ? Getting an erection.  You have swelling around your urethra and it gets worse.  You have blood in your urine for more than 2 days after the procedure.  You have pain or burning when you urinate, or other problems that do not go away or cause discomfort.  You have problems with your catheter or your catheter is blocked.  You have a fever.  You have nausea or you vomit.  You have swelling in your legs. Get help right away if:  Your urine has blood clots in it.  Your urine is dark red.  You cannot urinate after your catheter is removed.  You have blood in  your stool.  You have severe pain that does not get better with medicine.  You have shortness of breath. Summary  After the procedure, it is common to have swelling and discomfort around your urethra and blood in your urine for a few days.  Some men may have problems urinating after this procedure. These problems should go away after a few days. If you have pain or burning while urinating, contact your health care provider.  If you have a catheter after this procedure, care for it as told by your health care provider.  If you have severe pain, dark red urine, or  urine with blood clots, get medical help right away. This information is not intended to replace advice given to you by your health care provider. Make sure you discuss any questions you have with your health care provider. Document Released: 02/18/2017 Document Revised: 12/01/2018 Document Reviewed: 02/18/2017 Elsevier Patient Education  2020 [employer]   1) The drugs that you were given will stay in your system until tomorrow so for the next 24 hours you should not:  A) Drive an automobile B) Make any legal decisions C) Drink any alcoholic beverage   2) You may resume regular meals tomorrow.  Today it is better to start with liquids and gradually work up to solid foods.  You may eat anything you prefer, but it is better to start with liquids, then soup and crackers, and gradually work up to solid foods.   3) Please notify your doctor immediately if you have any unusual bleeding, trouble breathing, redness and pain at the surgery site, drainage, fever, or pain not relieved by medication.  4) Your post-operative visit with Dr.                                     is: Date:                        Time:    Please call to schedule your post-operative visit.  5) Additional Instructions: AMBULATORY SURGERY  DISCHARGE INSTRUCTIONS   6) The drugs that you were given will stay in your system until tomorrow so for the next 24 hours you should not:  D) Drive an automobile E) Make any legal decisions F) Drink any alcoholic beverage   7) You may resume regular meals tomorrow.  Today it is better to start with liquids and gradually work up to solid foods.  You may eat anything you prefer, but it is better to start with liquids, then soup and crackers, and gradually work up to solid foods.   8) Please notify your doctor immediately if you have any unusual bleeding, trouble breathing, redness and pain at the surgery site, drainage, fever,  or pain not relieved by medication.    9) Additional Instructions:        Please contact your physician with any problems or Same Day Surgery at [phone], Monday through Friday 6 am to 4 pm, or [name] [facility] number at [phone].

## 2019-05-29 NOTE — Anesthesia Post-op Follow-up Note (Signed)
Anesthesia QCDR form completed.        

## 2019-05-30 ENCOUNTER — Encounter: Payer: Self-pay | Admitting: Urology

## 2019-05-31 ENCOUNTER — Ambulatory Visit: Payer: BC Managed Care – PPO | Admitting: Urology

## 2019-05-31 ENCOUNTER — Other Ambulatory Visit: Payer: Self-pay

## 2019-05-31 ENCOUNTER — Encounter: Payer: Self-pay | Admitting: Physician Assistant

## 2019-05-31 ENCOUNTER — Encounter: Payer: Self-pay | Admitting: Urology

## 2019-05-31 ENCOUNTER — Ambulatory Visit (INDEPENDENT_AMBULATORY_CARE_PROVIDER_SITE_OTHER): Payer: BC Managed Care – PPO | Admitting: Physician Assistant

## 2019-05-31 DIAGNOSIS — N401 Enlarged prostate with lower urinary tract symptoms: Secondary | ICD-10-CM

## 2019-05-31 DIAGNOSIS — N138 Other obstructive and reflux uropathy: Secondary | ICD-10-CM

## 2019-05-31 NOTE — Progress Notes (Signed)
Catheter Removal  Patient is present today for a catheter removal.  29ml of water was drained from the balloon. A 20FR foley cath was removed from the bladder no complications were noted . Patient tolerated well.  Performed by: Debroah Loop, PA-C  Follow up/ Additional notes: 07/11/2019 postop appt with Dr. Erlene Quan. Return to work when you feel comfortable (requested and provided work note with planned return on Monday, 10/12). Take Tylenol for pain and call the office if this does not provide symptomatic relief. Use absorbant pads as needed for urinary leakage, which will improve over the next several weeks.

## 2019-06-01 LAB — SURGICAL PATHOLOGY

## 2019-06-02 NOTE — Telephone Encounter (Signed)
Called patient and discussed concerns of leakage which is normal post Holep and can take 4-6wks to get better. He also states some burning still with urination. 4 days post op this can still be expected he was told to utilize AZO OTC to help with the burning. If burning continues into next week contact the office to rule out infection

## 2019-06-07 ENCOUNTER — Encounter: Payer: Self-pay | Admitting: Urology

## 2019-06-07 NOTE — Anesthesia Postprocedure Evaluation (Signed)
Anesthesia Post Note  Patient: Luke Coleman  Procedure(s) Performed: HOLEP-LASER ENUCLEATION OF THE PROSTATE WITH MORCELLATION (N/A Prostate)  Patient location during evaluation: PACU Anesthesia Type: General Level of consciousness: awake and alert Pain management: pain level controlled Vital Signs Assessment: post-procedure vital signs reviewed and stable Respiratory status: spontaneous breathing, nonlabored ventilation, respiratory function stable and patient connected to nasal cannula oxygen Cardiovascular status: blood pressure returned to baseline and stable Postop Assessment: no apparent nausea or vomiting Anesthetic complications: no     Last Vitals:  Vitals:   05/29/19 1129 05/29/19 1159  BP: 135/73 132/76  Pulse: 87 (!) 50  Resp: 14 18  Temp: (!) 36.2 C   SpO2: 98% 98%    Last Pain:  Vitals:   05/30/19 0816  TempSrc:   PainSc: 5                  Molli Barrows

## 2019-06-14 ENCOUNTER — Encounter: Payer: Self-pay | Admitting: Urology

## 2019-06-20 ENCOUNTER — Encounter: Payer: Self-pay | Admitting: Urology

## 2019-06-21 ENCOUNTER — Telehealth: Payer: Self-pay | Admitting: *Deleted

## 2019-06-21 ENCOUNTER — Ambulatory Visit: Payer: Self-pay | Admitting: Physician Assistant

## 2019-06-21 NOTE — Telephone Encounter (Signed)
Spoke with patient-has been having ongoing burning and leaking. Advised to be evaluated-made an appointment-patient verbalized understanding.

## 2019-06-21 NOTE — Telephone Encounter (Signed)
ERROR

## 2019-06-22 ENCOUNTER — Ambulatory Visit (INDEPENDENT_AMBULATORY_CARE_PROVIDER_SITE_OTHER): Payer: BC Managed Care – PPO | Admitting: Physician Assistant

## 2019-06-22 ENCOUNTER — Other Ambulatory Visit: Payer: Self-pay

## 2019-06-22 ENCOUNTER — Encounter: Payer: Self-pay | Admitting: Physician Assistant

## 2019-06-22 ENCOUNTER — Other Ambulatory Visit: Payer: Self-pay | Admitting: Nurse Practitioner

## 2019-06-22 VITALS — BP 121/72 | HR 106 | Ht 67.0 in | Wt 204.0 lb

## 2019-06-22 DIAGNOSIS — N3289 Other specified disorders of bladder: Secondary | ICD-10-CM

## 2019-06-22 DIAGNOSIS — R300 Dysuria: Secondary | ICD-10-CM

## 2019-06-22 DIAGNOSIS — J302 Other seasonal allergic rhinitis: Secondary | ICD-10-CM

## 2019-06-22 LAB — MICROSCOPIC EXAMINATION
RBC, Urine: >30 /hpf — AB (ref 0–2)
WBC, UA: >30 /hpf — AB (ref 0–5)

## 2019-06-22 LAB — URINALYSIS, COMPLETE
Bilirubin, UA: NEGATIVE
Ketones, UA: NEGATIVE
Nitrite, UA: NEGATIVE
Specific Gravity, UA: >1.03 — ABNORMAL HIGH (ref 1.005–1.030)
Urobilinogen, Ur: 0.2 mg/dL (ref 0.2–1.0)
pH, UA: 5 (ref 5.0–7.5)

## 2019-06-22 MED ORDER — CIPROFLOXACIN HCL 250 MG PO TABS: 250.0000 mg | ORAL_TABLET | Freq: Two times a day (BID) | ORAL | 0 refills | Status: AC

## 2019-06-22 NOTE — Patient Instructions (Addendum)
Kegel Exercises  Kegel exercises can help strengthen your pelvic floor muscles. The pelvic floor is a group of muscles that support your rectum, small intestine, and bladder. These muscles help you control the flow of urine and stool. Kegel exercises are painless and simple, and they do not require any equipment. Your provider may suggest Kegel exercises to:  Improve bladder and bowel control.  Improve sexual response.  Improve weak pelvic floor muscles after prostate gland removal or surgery (males). Kegel exercises involve squeezing your pelvic floor muscles, which are the same muscles you squeeze when you try to stop the flow of urine or keep from passing gas. The exercises can be done while sitting, standing, or lying down, but it is best to vary your position. Exercises How to do Kegel exercises: 1. Squeeze your pelvic floor muscles tight. You should feel a tight lift in your rectal area. Keep your stomach, buttocks, and legs relaxed. 2. Hold the muscles tight for up to 10 seconds. 3. Breathe normally. 4. Relax your muscles. 5. Repeat as told by your health care provider. Repeat this exercise daily as told by your health care provider. Continue to do this exercise for at least 4-6 weeks, or for as long as told by your health care provider. You may be referred to a physical therapist who can help you learn more about how to do Kegel exercises. Depending on your condition, your health care provider may recommend:  Varying how long you squeeze your muscles.  Doing several sets of exercises every day.  Doing exercises for several weeks.  Making Kegel exercises a part of your regular exercise routine. This information is not intended to replace advice given to you by your health care provider. Make sure you discuss any questions you have with your health care provider. Document Released: 07/27/2012 Document Revised: 03/30/2018 Document Reviewed: 03/30/2018 Elsevier Patient Education   2020 [employer].

## 2019-06-22 NOTE — Progress Notes (Signed)
06/22/2019 9:37 AM   Luke Coleman March 20, 1951 [id number]  CC: Dysuria  HPI: Luke Coleman is a 68 y.o. male who presents today for evaluation of possible UTI. He is an established [facility] patient who last saw me on 05/31/2019 for catheter removal following HOLEP with Dr. Erlene Quan on 05/29/2019.  He reports a 3-week history of dysuria, frequency, and urinary leakage following surgery. He has also noticed some gross hematuria with his first morning urine.  Additionally, he describes an electric sensation in his bladder accompanying large volume urinary leakage. He denies fever, chills, nausea, vomiting, and flank pain.    He does not have a history of recurrent UTI.  In-office UA today positive for 3+ blood, 2+ protein, and 2+ leukocyte esterase; urine microscopy with >30 WBCs/HPF and >30 RBCs/HPF.  He is allergic to sulfa antibiotics.  PMH: Past Medical History:  Diagnosis Date  . Acute bacterial sinusitis 08/22/2014  . COPD (chronic obstructive pulmonary disease) ([place])   . Diabetes mellitus without complication ([place])   . Enlarged prostate   . Essential (primary) hypertension 03/01/2013   Overview:  Last Assessment & Plan:  Taking meds as prescribed. Check bmet. Continue to follow.   Marland Kitchen HTN (hypertension) 03/01/2013  . Hypertension   . Sleep apnea    on CPAP, USUALLY...CURRENTLY BROKEN    Surgical History: Past Surgical History:  Procedure Laterality Date  . APPENDECTOMY    . CATARACT EXTRACTION W/PHACO Left 10/26/2018   Procedure: CATARACT EXTRACTION PHACO AND INTRAOCULAR LENS PLACEMENT ([place]) LEFT;  Surgeon: Eulogio Bear, MD;  Location: [facility] ORS;  Service: Ophthalmology;  Laterality: Left;  [name] 00:30.7 CDE 20.7 Fluid pack Lot # [id number]  . CIRCUMCISION  Age 41  . COLON SURGERY    . EYE SURGERY Right 1971   correct lazy eye  . HERNIA REPAIR    . HOLEP-LASER ENUCLEATION OF THE PROSTATE WITH MORCELLATION N/A 05/29/2019   Procedure: HOLEP-LASER ENUCLEATION OF THE PROSTATE WITH  MORCELLATION;  Surgeon: [name], MD;  Location: [facility] ORS;  Service: Urology;  Laterality: N/A;  . NASAL SEPTUM SURGERY     For deviated septum  . SMALL INTESTINE SURGERY     Adhesions    Home Medications:  Allergies as of 06/22/2019      Reactions   Sulfa Antibiotics Rash   Penile rash      Medication List       Accurate as of June 22, 2019 11:59 PM. If you have any questions, ask your nurse or doctor.        STOP taking these medications   docusate sodium 100 MG capsule Commonly known as: COLACE Stopped by: Debroah Loop, PA-C   HYDROcodone-acetaminophen 5-325 MG tablet Commonly known as: NORCO/VICODIN Stopped by: Debroah Loop, PA-C     TAKE these medications   ciprofloxacin 250 MG tablet Commonly known as: CIPRO Take 1 tablet (250 mg total) by mouth 2 (two) times daily for 7 days. Started by: Debroah Loop, PA-C   finasteride 5 MG tablet Commonly known as: Proscar Take 1 tablet (5 mg total) by mouth daily.   fluticasone 50 MCG/ACT nasal spray Commonly known as: FLONASE SHAKE LIQUID AND USE 2 SPRAYS IN EACH NOSTRIL DAILY What changed: See the new instructions. Changed by: [name], NP   glucose blood test strip Commonly known as: ONE TOUCH ULTRA TEST USE TO TEST THREE TIMES DAILY   ipratropium 0.06 % nasal spray Commonly known as: ATROVENT USE 2 SPRAYS IN EACH NOSTRIL FOUR TIMES DAILY FOR  UP TO 5 TO 7 DAYS THEN STOP   Jardiance 25 MG Tabs tablet Generic drug: empagliflozin Take 25 mg by mouth daily.   Lancets 28G Misc 3 (three) times daily. Use as instructed. DX: E11.9   lisinopril-hydrochlorothiazide 20-12.5 MG tablet Commonly known as: ZESTORETIC TAKE 1 TABLET BY MOUTH DAILY   tadalafil 5 MG tablet Commonly known as: CIALIS Take 1 tablet (5 mg total) by mouth daily as needed for erectile dysfunction.   Trulicity 1.5 0000000 Sopn Generic drug: Dulaglutide Inject 1.5 mg into the skin every 7 (seven)  days.       Allergies:  Allergies  Allergen Reactions  . Sulfa Antibiotics Rash    Penile rash    Family History: Family History  Problem Relation Age of Onset  . Early death Mother        MVA - died age 35  . Alcohol abuse Father   . Hypertension Father   . Cancer Father 25       renal cell carcinoma  . Heart disease Father 49       MI - died  . Diabetes Brother   . Sleep apnea Brother   . Sleep apnea Daughter   . Sleep apnea Brother   . Diabetes Maternal Uncle   . Diabetes Paternal Aunt   . Kidney disease Maternal Grandmother     Social History:   reports that he has never smoked. He has never used smokeless tobacco. He reports current alcohol use. He reports that he does not use drugs.  ROS: UROLOGY Frequent Urination?: Yes Hard to postpone urination?: Yes Burning/pain with urination?: Yes Get up at night to urinate?: No Leakage of urine?: Yes Urine stream starts and stops?: No Trouble starting stream?: No Do you have to strain to urinate?: No Blood in urine?: No Urinary tract infection?: No Sexually transmitted disease?: No Injury to kidneys or bladder?: No Painful intercourse?: No Weak stream?: No Erection problems?: No Penile pain?: No  Gastrointestinal Nausea?: No Vomiting?: No Indigestion/heartburn?: No Diarrhea?: No Constipation?: No  Constitutional Fever: No Night sweats?: No Weight loss?: No Fatigue?: No  Skin Skin rash/lesions?: No Itching?: No  Eyes Blurred vision?: No Double vision?: No  Ears/Nose/Throat Sore throat?: No Sinus problems?: No  Hematologic/Lymphatic Swollen glands?: No Easy bruising?: No  Cardiovascular Leg swelling?: No Chest pain?: No  Respiratory Cough?: No Shortness of breath?: No  Endocrine Excessive thirst?: No  Musculoskeletal Back pain?: No Joint pain?: No  Neurological Headaches?: No Dizziness?: No  Psychologic Depression?: No Anxiety?: No  Physical Exam: BP 121/72   Pulse  (!) 106   Ht 5\' 7"  (1.702 m)   Wt 204 lb (92.5 kg)   BMI 31.95 kg/m   Constitutional:  Alert and oriented, no acute distress, nontoxic appearing HEENT: [place], AT Cardiovascular: No clubbing, cyanosis, or edema Respiratory: Normal respiratory effort, no increased work of breathing Skin: No rashes, bruises or suspicious lesions Neurologic: Grossly intact, no focal deficits, moving all 4 extremities Psychiatric: Normal mood and affect  Laboratory Data: Results for orders placed or performed in visit on 06/22/19  Microscopic Examination   URINE  Result Value Ref Range   WBC, UA >30 (A) 0 - 5 /hpf   RBC >30 (A) 0 - 2 /hpf   Epithelial Cells (non renal) 0-10 0 - 10 /hpf   Bacteria, UA Few None seen/Few  Urinalysis, Complete  Result Value Ref Range   Specific Gravity, UA >1.030 (H) 1.005 - 1.030   pH, UA 5.0  5.0 - 7.5   Color, UA Yellow Yellow   Appearance Ur Cloudy (A) Clear   Leukocytes,UA 2+ (A) Negative   Protein,UA 2+ (A) Negative/Trace   Glucose, UA Trace (A) Negative   Ketones, UA Negative Negative   RBC, UA 3+ (A) Negative   Bilirubin, UA Negative Negative   Urobilinogen, Ur 0.2 0.2 - 1.0 mg/dL   Nitrite, UA Negative Negative   Microscopic Examination See below:    Assessment & Plan:   1. Dysuria 68 year old male s/p HOLEP 24 days ago with complaints of dysuria after surgery.  UA with significant pyuria and microscopic hematuria, consistent with either postoperative state or acute cystitis.  Will send for culture and start antibiotics today. - Urinalysis, Complete - CULTURE, URINE COMPREHENSIVE - ciprofloxacin (CIPRO) 250 MG tablet; Take 1 tablet (250 mg total) by mouth 2 (two) times daily for 7 days.  Dispense: 14 tablet; Refill: 0  2. Bladder spasm Patient describes high-volume urinary leakage associated with an electrical sensation in his bladder.  I believe he is having bladder spasms.  I provided him with a 1 month sample of Myrbetriq 50 mg and instructed him to take  this daily.  I counseled him that I do not believe he will have to remain on this medication long-term, however it may alleviate his bladder spasms as he continues to heal from surgery.  Also provided him with information about Kegel exercises today.  Return if symptoms worsen or fail to improve.  Debroah Loop, PA-C  [facility] [address], [place], [place] [address] [phone]

## 2019-06-25 LAB — CULTURE, URINE COMPREHENSIVE

## 2019-06-26 ENCOUNTER — Telehealth: Payer: Self-pay | Admitting: Physician Assistant

## 2019-06-26 NOTE — Telephone Encounter (Signed)
Just spoke with patient and informed him of his negative urine culture results.  I counseled him to stop his prescribed Cipro as he does not have a urinary tract infection.  He reported continuation of his symptoms from last week.  I believe these are associated with his postoperative status and bladder spasms.  I counseled him that Myrbetriq can take several weeks to fully kick in and I expect he will begin to see a benefit with continued use of this medication.  I advised him to contact the office if he experiences acute worsening in his symptoms.  He expressed understanding.

## 2019-07-06 LAB — HEMOGLOBIN A1C: Hemoglobin A1C: 6.2

## 2019-07-11 ENCOUNTER — Other Ambulatory Visit: Payer: Self-pay

## 2019-07-11 ENCOUNTER — Encounter: Payer: Self-pay | Admitting: Urology

## 2019-07-11 ENCOUNTER — Ambulatory Visit (INDEPENDENT_AMBULATORY_CARE_PROVIDER_SITE_OTHER): Payer: BC Managed Care – PPO | Admitting: Urology

## 2019-07-11 VITALS — BP 136/79 | HR 112 | Ht 67.0 in | Wt 208.0 lb

## 2019-07-11 DIAGNOSIS — N401 Enlarged prostate with lower urinary tract symptoms: Secondary | ICD-10-CM | POA: Diagnosis not present

## 2019-07-11 DIAGNOSIS — N138 Other obstructive and reflux uropathy: Secondary | ICD-10-CM

## 2019-07-11 DIAGNOSIS — N393 Stress incontinence (female) (male): Secondary | ICD-10-CM

## 2019-07-11 DIAGNOSIS — R3915 Urgency of urination: Secondary | ICD-10-CM

## 2019-07-11 LAB — URINALYSIS, COMPLETE
Bilirubin, UA: NEGATIVE
Glucose, UA: NEGATIVE
Ketones, UA: NEGATIVE
Leukocytes,UA: NEGATIVE
Nitrite, UA: NEGATIVE
Protein,UA: NEGATIVE
Specific Gravity, UA: 1.025 (ref 1.005–1.030)
Urobilinogen, Ur: 0.2 mg/dL (ref 0.2–1.0)
pH, UA: 5.5 (ref 5.0–7.5)

## 2019-07-11 LAB — MICROSCOPIC EXAMINATION
Bacteria, UA: NONE SEEN
Epithelial Cells (non renal): NONE SEEN /hpf (ref 0–10)

## 2019-07-11 LAB — BLADDER SCAN AMB NON-IMAGING

## 2019-07-11 MED ORDER — OXYBUTYNIN CHLORIDE ER 15 MG PO TB24: 15.0000 mg | ORAL_TABLET | Freq: Every day | ORAL | 11 refills | Status: DC

## 2019-07-11 NOTE — Progress Notes (Signed)
07/11/2019 3:31 PM   Luke Coleman 12/02/1950 [id number]  Referring provider: [name], NP [place],  [place] [address]  Chief Complaint  Patient presents with  . Benign Prostatic Hypertrophy    6wk post op    HPI: 68 year old male with personal history of BPH with refractory urinary symptoms/incomplete bladder emptying who is now status post holmium laser enucleation of the prostate on 05/29/2019.  He returns today for postop evaluation.  Prior to surgery, he was on finasteride and Cialis for his BPH symptoms.  Surgical pathology was benign.  Per the pathology report only 6.7 g was resected?.  Postoperatively, he was seen by Debroah Loop on 05/2028 with urgency frequency urge incontinence and dysuria.  Urinalysis was consistent with postoperative urine.  Urine culture was ultimately negative.  He was treated symptomatically and given samples of Myrbetriq and told to stop his Cipro.  Today, he does feel like he is moving in the right direction but is overall having some difficulty.  He continues to have leakage with physical activity which is improving but present.  He also has frequency and urgency.  IPSS as below.      IPSS    Row Name 07/11/19 1300         International Prostate Symptom Score   How often have you had the sensation of not emptying your bladder?  About half the time     How often have you had to urinate less than every two hours?  More than half the time     How often have you found you stopped and started again several times when you urinated?  Less than half the time     How often have you found it difficult to postpone urination?  Almost always     How often have you had a weak urinary stream?  About half the time     How often have you had to strain to start urination?  Not at All     How many times did you typically get up at night to urinate?  4 Times     Total IPSS Score  21       Quality of Life due to urinary symptoms    If you were to spend the rest of your life with your urinary condition just the way it is now how would you feel about that?  Terrible        Score:  1-7 Mild 8-19 Moderate 20-35 Severe    PMH: Past Medical History:  Diagnosis Date  . Acute bacterial sinusitis 08/22/2014  . COPD (chronic obstructive pulmonary disease) ([place])   . Diabetes mellitus without complication ([place])   . Enlarged prostate   . Essential (primary) hypertension 03/01/2013   Overview:  Last Assessment & Plan:  Taking meds as prescribed. Check bmet. Continue to follow.   Marland Kitchen HTN (hypertension) 03/01/2013  . Hypertension   . Sleep apnea    on CPAP, USUALLY...CURRENTLY BROKEN    Surgical History: Past Surgical History:  Procedure Laterality Date  . APPENDECTOMY    . CATARACT EXTRACTION W/PHACO Left 10/26/2018   Procedure: CATARACT EXTRACTION PHACO AND INTRAOCULAR LENS PLACEMENT ([place]) LEFT;  Surgeon: Eulogio Bear, MD;  Location: [facility] ORS;  Service: Ophthalmology;  Laterality: Left;  [name] 00:30.7 CDE 20.7 Fluid pack Lot # [id number]  . CIRCUMCISION  Age 40  . COLON SURGERY    . EYE SURGERY Right 1971   correct lazy eye  .  HERNIA REPAIR    . HOLEP-LASER ENUCLEATION OF THE PROSTATE WITH MORCELLATION N/A 05/29/2019   Procedure: HOLEP-LASER ENUCLEATION OF THE PROSTATE WITH MORCELLATION;  Surgeon: [name], MD;  Location: [facility] ORS;  Service: Urology;  Laterality: N/A;  . NASAL SEPTUM SURGERY     For deviated septum  . SMALL INTESTINE SURGERY     Adhesions    Home Medications:  Allergies as of 07/11/2019      Reactions   Sulfa Antibiotics Rash   Penile rash      Medication List       Accurate as of July 11, 2019 11:59 PM. If you have any questions, ask your nurse or doctor.        STOP taking these medications   ipratropium 0.06 % nasal spray Commonly known as: ATROVENT Stopped by: [name], MD   Jardiance 25 MG Tabs tablet Generic drug: empagliflozin Stopped by: [name],  MD     TAKE these medications   finasteride 5 MG tablet Commonly known as: Proscar Take 1 tablet (5 mg total) by mouth daily.   fluticasone 50 MCG/ACT nasal spray Commonly known as: FLONASE SHAKE LIQUID AND USE 2 SPRAYS IN EACH NOSTRIL DAILY   glucose blood test strip Commonly known as: ONE TOUCH ULTRA TEST USE TO TEST THREE TIMES DAILY   Lancets 28G Misc 3 (three) times daily. Use as instructed. DX: E11.9   lisinopril-hydrochlorothiazide 20-12.5 MG tablet Commonly known as: ZESTORETIC TAKE 1 TABLET BY MOUTH DAILY   oxybutynin 15 MG 24 hr tablet Commonly known as: DITROPAN XL Take 1 tablet (15 mg total) by mouth daily. Started by: [name], MD   tadalafil 5 MG tablet Commonly known as: CIALIS Take 1 tablet (5 mg total) by mouth daily as needed for erectile dysfunction.   Trulicity 1.5 0000000 Sopn Generic drug: Dulaglutide Inject 1.5 mg into the skin every 7 (seven) days.       Allergies:  Allergies  Allergen Reactions  . Sulfa Antibiotics Rash    Penile rash    Family History: Family History  Problem Relation Age of Onset  . Early death Mother        MVA - died age 81  . Alcohol abuse Father   . Hypertension Father   . Cancer Father 59       renal cell carcinoma  . Heart disease Father 94       MI - died  . Diabetes Brother   . Sleep apnea Brother   . Sleep apnea Daughter   . Sleep apnea Brother   . Diabetes Maternal Uncle   . Diabetes Paternal Aunt   . Kidney disease Maternal Grandmother     Social History:  reports that he has never smoked. He has never used smokeless tobacco. He reports current alcohol use. He reports that he does not use drugs.  ROS: UROLOGY Frequent Urination?: Yes Hard to postpone urination?: Yes Burning/pain with urination?: Yes Get up at night to urinate?: Yes Leakage of urine?: Yes Urine stream starts and stops?: No Trouble starting stream?: No Do you have to strain to urinate?: No Blood in urine?: No  Urinary tract infection?: No Sexually transmitted disease?: No Injury to kidneys or bladder?: No Painful intercourse?: No Weak stream?: No Erection problems?: No Penile pain?: No  Gastrointestinal Nausea?: No Vomiting?: No Indigestion/heartburn?: No Diarrhea?: No Constipation?: No  Constitutional Fever: No Night sweats?: No Weight loss?: No Fatigue?: No  Skin Skin rash/lesions?: No Itching?: No  Eyes  Blurred vision?: No Double vision?: No  Ears/Nose/Throat Sore throat?: No Sinus problems?: No  Hematologic/Lymphatic Swollen glands?: No Easy bruising?: No  Cardiovascular Leg swelling?: No Chest pain?: No  Respiratory Cough?: No Shortness of breath?: No  Endocrine Excessive thirst?: No  Musculoskeletal Back pain?: No Joint pain?: No  Neurological Headaches?: No Dizziness?: No  Psychologic Depression?: No Anxiety?: No  Physical Exam: BP 136/79   Pulse (!) 112   Ht 5\' 7"  (1.702 m)   Wt 208 lb (94.3 kg)   BMI 32.58 kg/m   Constitutional:  Alert and oriented, No acute distress. HEENT: [place] AT, moist mucus membranes.  Trachea midline, no masses. Cardiovascular: No clubbing, cyanosis, or edema. Respiratory: Normal respiratory effort, no increased work of breathing. Skin: No rashes, bruises or suspicious lesions. Neurologic: Grossly intact, no focal deficits, moving all 4 extremities. Psychiatric: Normal mood and affect.  Laboratory Data: Lab Results  Component Value Date   WBC 8.1 05/25/2019   HGB 15.4 05/25/2019   HCT 45.3 05/25/2019   MCV 92.6 05/25/2019   PLT 180 05/25/2019    Lab Results  Component Value Date   CREATININE 1.28 (H) 05/25/2019    Lab Results  Component Value Date   PSA 1.4 06/02/2018   PSA 0.47 05/13/2017     Lab Results  Component Value Date   HGBA1C 6.6 (A) 06/02/2018    Urinalysis Results for orders placed or performed in visit on 07/11/19  Microscopic Examination   URINE  Result Value Ref Range    WBC, UA 0-5 0 - 5 /hpf   RBC >30R 0 - 2 /hpf   Epithelial Cells (non renal) None seen 0 - 10 /hpf   Bacteria, UA None seen None seen/Few  Urinalysis, Complete  Result Value Ref Range   Specific Gravity, UA 1.025 1.005 - 1.030   pH, UA 5.5 5.0 - 7.5   Color, UA Yellow Yellow   Appearance Ur Clear Clear   Leukocytes,UA Negative Negative   Protein,UA Negative Negative/Trace   Glucose, UA Negative Negative   Ketones, UA Negative Negative   RBC, UA 3+ (A) Negative   Bilirubin, UA Negative Negative   Urobilinogen, Ur 0.2 0.2 - 1.0 mg/dL   Nitrite, UA Negative Negative   Microscopic Examination See below:   Bladder Scan (Post Void Residual) in office  Result Value Ref Range   Scan Result 65ml     Assessment & Plan:    1. BPH with obstruction/lower urinary tract symptoms Status post holmium laser enucleation of the prostate for refractory symptoms  Some improvement in his initial postoperative urinary symptoms but still relatively symptomatic with urgency/urge incontinence versus stress incontinence  Urine today with microscopic blood only, no other signs of infection which is reassuring.  Pyuria has resolved.  Patient was reassured, anticipate continued slow improvement of urinary symptoms.  We will continue finasteride/Cialis for the time being with close follow-up.  Adequate bladder emptying today  - Bladder Scan (Post Void Residual) in office - Ambulatory referral to Physical Therapy  2. Urinary urgency Previously taking Myrbetriq which was somewhat helpful  No samples available today, will prescribe oxybutynin 15 mg for symptomatic relief of his urinary urgency and urge incontinence symptoms hopefully temporarily  We will reassess again in 3 months, anticipate slow improvement - Urinalysis, Complete  3. Stress incontinence (male) (male) As per our preoperative discussion, anticipate this will improve with time  Previously been given physical therapy exercises to be  doing, may benefit from PT referral which  he is willing to pursue   Return in about 3 months (around 10/11/2019) for IPSS/ PVR/ UA.  [name], MD  [facility] [address], [place],  [address] [phone]

## 2019-07-11 NOTE — Patient Instructions (Signed)

## 2019-07-24 ENCOUNTER — Encounter: Payer: Self-pay | Admitting: Urology

## 2019-07-27 ENCOUNTER — Telehealth: Payer: Self-pay | Admitting: Urology

## 2019-07-27 MED ORDER — MIRABEGRON ER 50 MG PO TB24: 50.0000 mg | ORAL_TABLET | Freq: Every day | ORAL | 11 refills | Status: DC

## 2019-07-27 NOTE — Telephone Encounter (Signed)
Yes, okay to call in Myrbetriq, would prescribe the 50 mg dose.  Please check and see if we have gotten samples and if so, give him a months worth.  [name], MD

## 2019-07-27 NOTE — Telephone Encounter (Signed)
Patient aware samples of Myrbetriq left upfront for him to pick up and prescription sent in. He has been taking Oxybutynin and Myrbetriq daily and has been improving his symptoms-wanted you to be aware.

## 2019-07-27 NOTE — Telephone Encounter (Signed)
Pt states he sent MyChart message about his medication about a week ago and no one has responded. Pt is following up on the message. He doesn't know the name of the different medications he's inquring about, but states he would like to get some samples tomorrow if possible. Please advise pt at [phone]

## 2019-07-28 ENCOUNTER — Other Ambulatory Visit: Payer: Self-pay

## 2019-07-28 ENCOUNTER — Encounter: Payer: Self-pay | Admitting: Physical Therapy

## 2019-07-28 ENCOUNTER — Ambulatory Visit: Payer: BC Managed Care – PPO | Attending: Urology | Admitting: Physical Therapy

## 2019-07-28 DIAGNOSIS — M62838 Other muscle spasm: Secondary | ICD-10-CM | POA: Diagnosis present

## 2019-07-28 DIAGNOSIS — R278 Other lack of coordination: Secondary | ICD-10-CM

## 2019-07-28 DIAGNOSIS — M533 Sacrococcygeal disorders, not elsewhere classified: Secondary | ICD-10-CM | POA: Diagnosis present

## 2019-07-28 NOTE — Patient Instructions (Addendum)
Ratio of bladder irritants :  2  ( 12 fl oz of soda)  + 1 cup of coffee to 2 ,     8 oz water   32 fl oz   irritants to  8 fl oz water  4: 1 ratio  __  Decrease soda to 1 bottle per day ( 16 fl oz) and keep coffee ( 8 oz)    = 24 fl oz total    Increase water to 2 ( 16 fl oz)   Room temp  =  32 fl oz total   ( first thing in the morning Room temp or warm with lemon juice  To cleanse gut  8 oz   16 fl oz at work   Before dinner 8 fl oz   __________   Sharyn Lull portable cycler   _________  Open book ( handout) Pelvic tilt      Morning and night  _________   Getting out of bed   Get on all fours, perpendicular to bed Lower one leg down , support with hands   ________   Proper body mechanics with getting out of a chair to decrease strain  on back &pelvic floor   Avoid holding your breath when Getting out of the chair:  Scoot to front part of chair chair Heels behind feet, feet are hip width apart, nose over toes  Inhale like you are smelling roses Exhale to stand

## 2019-07-28 NOTE — Therapy (Addendum)
[place] MAIN [facility] SERVICES [address] [place], [place], [address] Phone: [phone]   Fax:  [phone]  Physical Therapy Evaluation  Patient Details  Name: Luke Coleman MRN: [id number] Date of Birth: October 05, 1950 No data recorded  Encounter Date: 07/28/2019  PT End of Session - 07/28/19 [id number]    Visit Number  1    Number of Visits  10    Date for PT Re-Evaluation  10/06/19    PT Start Time  0804    PT Stop Time  0930    PT Time Calculation (min)  86 min       Past Medical History:  Diagnosis Date  . Acute bacterial sinusitis 08/22/2014  . COPD (chronic obstructive pulmonary disease) ([place])   . Diabetes mellitus without complication ([place])   . Enlarged prostate   . Essential (primary) hypertension 03/01/2013   Overview:  Last Assessment & Plan:  Taking meds as prescribed. Check bmet. Continue to follow.   Marland Kitchen HTN (hypertension) 03/01/2013  . Hypertension   . Sleep apnea    on CPAP, USUALLY...CURRENTLY BROKEN    Past Surgical History:  Procedure Laterality Date  . APPENDECTOMY    . CATARACT EXTRACTION W/PHACO Left 10/26/2018   Procedure: CATARACT EXTRACTION PHACO AND INTRAOCULAR LENS PLACEMENT ([place]) LEFT;  Surgeon: Eulogio Bear, MD;  Location: [facility] ORS;  Service: Ophthalmology;  Laterality: Left;  [name] 00:30.7 CDE 20.7 Fluid pack Lot # [id number]  . CIRCUMCISION  Age 68  . COLON SURGERY    . EYE SURGERY Right 1971   correct lazy eye  . HERNIA REPAIR    . HOLEP-LASER ENUCLEATION OF THE PROSTATE WITH MORCELLATION N/A 05/29/2019   Procedure: HOLEP-LASER ENUCLEATION OF THE PROSTATE WITH MORCELLATION;  Surgeon: [name], MD;  Location: [facility] ORS;  Service: Urology;  Laterality: N/A;  . NASAL SEPTUM SURGERY     For deviated septum  . SMALL INTESTINE SURGERY     Adhesions    There were no vitals filed for this visit.   Subjective Assessment - 07/28/19 0818    Subjective  Pt underwent HOLEP procedure 2/2 BPH. prior to  surgery, pt had slowed streram, no burning, frequency, no pain.   1) urinary leakage/ burning: : After the surgery, stream is better but pt experiences burning before peeing and leakage during sit to stand and just stanidng and talking. Pt has a high bed and when he has the urge to go, getting off hisd bed can cause. Sometime pt is not doing anything and peeing for no reason.  Pt sits at work 5 hours. Sitting at home watching TV after work 3 horus before bed. Nocturia 3x.  Pt needs to go back again for sleep study and get a new CPAP machine .  Pt has used the CPAP machine for 10 years before it broke.  Daily bowel movements. Pt feels constipated and sometimes diarrhe after surgery. Someimtes. pt now has to strain bowel movement. Pt is taking medication prescribed by urologist which has constipation as a side effect.  Daily fluid intake: "Not as much as I did because I pee all over myself."  Used to drink 4-5 glasses of water  a day and now down to 2 glasses a day. One cup of coffee, no juice, no tea, no alcohol, 2 -  12 fl oz of soda.    2) pain with sitting after the surgery: sitting for 10 min the pain gets to 8/10. Pt gets up to walk  to ease the pain. Pain is worse on hard surfaces.Tingling in the penis with sitting. "Like shock" It is not as bad as before.  Physical activity: before COVID , pt walked treadmill. Recently. pt was walking his dogs but he has not wanted to do this due to the cold.     Pertinent History  Denied LBP but rarely a pain at L side . Denied falls on tailbone. In high school, broke ankle. 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  Hernias 2010         [facility] PT Assessment - 07/28/19 0850      Observation/Other Assessments   Observations  ankles crossed, breathholding        Single Leg Stance   Comments  R SLS with more LOB . trendelenberg, L with  perturbation of trunk      AROM   Overall AROM Comments  R sideflexion/ rotation less than L ( 25%, L 45%).        Strength   Overall Strength Comments  hip/knee flex/ ext/ abd  5/5       Palpation   Spinal mobility  increased tightness/ hypomobility at thoracic spine     SI assessment   L iliac crest higher, , slight R lumbar convex curve , L shoudler slightly lower .  supine, R iliac crest/ ankle lower.  Post Tx: equal.alignment    Palpation comment  R SIJ hypomobilty, sacral nutation limited. ( post Tx: restored)       Bed Mobility   Bed Mobility  --   head crunch               Objective measurements completed on examination: See above findings.    Pelvic Floor Special Questions - 07/28/19 0921    Diastasis Recti  bulging above umbilicus    External Perineal Exam  lower quadrant of abdomen, supapubic area  with increased fascial tightness lateral border of bladder, along longitudinal scar, and upper R Q of appendix scar        [facility] Adult PT Treatment/Exercise - 07/28/19 0850      Therapeutic Activites    Therapeutic Activities  --   discussed bladder irritants and water intake      Neuro Re-ed    Neuro Re-ed Details   cued for body mechanics getting out fo bed safely and with less strain on abdomen and pelvic floor, cued for slowed breathing to relax mm, cued for technique for HEP       Modalities   Modalities  Moist Heat      Moist Heat Therapy   Number Minutes Moist Heat  5 Minutes    Moist Heat Location  --   sacrum and thoracic      Manual Therapy   Manual therapy comments  superior glide at sacrum with MWM to promote nutation and hip ER,  AP mob Grade III to promote mobility at hip, STM/ MWM at thoracic region to promote mobility             PT Long Term Goals - 07/31/19 1004      PT LONG TERM GOAL #1   Title  PT will decrease his IPPS pts from 19pts to < 10 pts , QOL from 6 pts to < 3 pts in order to improve OQL and pelvic function    Time  10    Period  Weeks    Status  New    Target Date  10/06/19      PT LONG TERM GOAL #2   Title  Pt will demo less abdominal scar restrictions, abdominal bulging along hernia, and improved deep core coordination/ strength in order to increase intraabdominal pressure system for motility and urinary continence    Time  6    Period  Weeks    Status  New    Target Date  09/08/19      PT LONG TERM GOAL #3   Title  Pt will demo proper pelvic floor lengthening and diaphragmatic excursion in order to mininimize burning sensation with urination.    Time  4    Period  Weeks    Status  New    Target Date  08/25/19      PT LONG TERM GOAL #4   Title  Pt will demo increased spinal moblity R sideflexion. rotation, throacic extension, equal pelvic alignment across 2 visits in order to progress to deep core strengthening exericses    Time  2    Period  Weeks    Status  New    Target Date  08/14/19                      Plan - 07/28/19 [id number]    Clinical Impression Statement Pt is a 68 yo male who reports burning with urination and urinary leakage along with pain with sitting following his HOELP procedure 05/29/19.  His clinical presentations include limited spinal /pelvic mobility, pelvic malalignment, dyscoordination and strength of pelvic floor mm, hip weakness, abdominal scar restrictions, abdominal bulging ( Hx of hernia repair and currently with hernia). Pt also demo'd poor body mechanics which places strain on the abdominal/pelvic floor mm. These are deficits that indicate an ineffective intraabdominal pressure system associated with increased risk for urinary leakage.   Pt was provided education on etiology of Sx with anatomy, physiology explanation with images along with the benefits of customized pelvic PT Tx based on pt's medical conditions and musculoskeletal deficits.  Explained the physiology of deep core mm coordination and roles of pelvic floor function in urination,  defecation, sexual function, and postural control with deep core mm system.   Folowing Tx today, pt demo'd equal alignment of his pelvic girdle, increased spinal mobility. Plan to address abdominal restrictions to promote better intraabdominal pressures system at next session. Pt benefits from skilled PT.        Personal Factors and Comorbidities  Age;Comorbidity 3+    Comorbidities  HTN, diabetes, OSA.    Examination-Activity Limitations  Continence;Sit    Stability/Clinical Decision Making  Evolving/Moderate complexity    Clinical Decision Making  High    Rehab Potential  Good    PT Frequency  1x / week    PT Duration  Other (comment)   10   PT Treatment/Interventions  Balance training;Neuromuscular re-education;Stair training;Moist Heat;Therapeutic activities;Patient/family education;Manual techniques;Scar mobilization;Taping;Therapeutic exercise;Functional mobility training    Consulted and Agree with Plan of Care  Patient       Patient will benefit from skilled therapeutic intervention in order to improve the following deficits and impairments:  Increased muscle spasms, Decreased strength, Decreased range of motion, Decreased endurance, Decreased activity tolerance, Hypomobility, Decreased safety awareness, Decreased balance, Difficulty walking, Decreased coordination, Improper body mechanics, Pain, Postural dysfunction, Increased fascial restricitons  Visit Diagnosis: Other lack of coordination  Other muscle spasm  Sacrococcygeal disorders, not  elsewhere classified     Problem List Patient Active Problem List   Diagnosis Date Noted  . Obstructive sleep apnea syndrome 06/02/2018  . Chronic pain of left ankle 05/13/2017  . Irregular heartbeat 12/24/2016  . Class 1 obesity due to excess calories with serious comorbidity and body mass index (BMI) of 32.0 to 32.9 in adult 05/23/2013  . Essential (primary) hypertension 03/01/2013  . ED (erectile dysfunction) of organic origin  03/01/2013  . Diabetes mellitus, type 2 ([place]) 03/01/2013    Jerl Mina ,PT, DPT, E-RYT  07/28/2019, 12:49 PM  [place] MAIN [name] SERVICES [address] [place], [place], [address] Phone: [phone]   Fax:  [phone]  Name: Luke Coleman MRN: [id number] Date of Birth: 1950/12/27

## 2019-07-31 ENCOUNTER — Encounter: Payer: Self-pay | Admitting: Urology

## 2019-07-31 NOTE — Addendum Note (Signed)
Addended by: Jerl Mina on: 07/31/2019 06:01 PM   Modules accepted: Orders

## 2019-08-11 ENCOUNTER — Ambulatory Visit: Payer: BC Managed Care – PPO | Admitting: Physical Therapy

## 2019-08-21 ENCOUNTER — Other Ambulatory Visit: Payer: Self-pay | Admitting: Family Medicine

## 2019-08-21 ENCOUNTER — Ambulatory Visit: Payer: BC Managed Care – PPO | Admitting: Physical Therapy

## 2019-08-21 DIAGNOSIS — I10 Essential (primary) hypertension: Secondary | ICD-10-CM

## 2019-08-28 ENCOUNTER — Other Ambulatory Visit: Payer: Self-pay

## 2019-08-28 ENCOUNTER — Ambulatory Visit: Payer: BC Managed Care – PPO | Attending: Urology | Admitting: Physical Therapy

## 2019-08-28 DIAGNOSIS — M62838 Other muscle spasm: Secondary | ICD-10-CM

## 2019-08-28 DIAGNOSIS — M533 Sacrococcygeal disorders, not elsewhere classified: Secondary | ICD-10-CM | POA: Insufficient documentation

## 2019-08-28 DIAGNOSIS — R278 Other lack of coordination: Secondary | ICD-10-CM | POA: Insufficient documentation

## 2019-08-28 NOTE — Patient Instructions (Addendum)
Multifidis twist  - Red band  Band is on doorknob: stand further away from door (facing perpendicular)   Twisting trunk without moving the hips and knees Hold band at the level of ribcage, elbows bent,shoulder blades roll back and down like squeezing a pencil under armpit    Exhale twist,.10-15 deg away from door without moving your hips/ knees. Continue to maintain equal weight through legs. Keep knee unlocked.  10 x 2   ____   Deep core level 1 and 2  ( handout)  ____ Sitting with feet on the ground   ____  Buy a portable cycle  Place under chair Place folded hard blankets to make seat higher Place two chairs on either side to place forearms on back side of chair

## 2019-08-28 NOTE — Therapy (Signed)
[place] MAIN [facility] SERVICES [address] [place], [place], [address] Phone: [phone]   Fax:  [phone]  Physical Therapy Treatment  Patient Details  Name: Luke Coleman MRN: [id number] Date of Birth: 1950/09/19 No data recorded  Encounter Date: 08/28/2019  PT End of Session - 08/28/19 1311    Visit Number  2    Number of Visits  10    Date for PT Re-Evaluation  10/06/19    PT Start Time  1000    PT Stop Time  1100    PT Time Calculation (min)  60 min       Past Medical History:  Diagnosis Date  . Acute bacterial sinusitis 08/22/2014  . COPD (chronic obstructive pulmonary disease) ([place])   . Diabetes mellitus without complication ([place])   . Enlarged prostate   . Essential (primary) hypertension 03/01/2013   Overview:  Last Assessment & Plan:  Taking meds as prescribed. Check bmet. Continue to follow.   Marland Kitchen HTN (hypertension) 03/01/2013  . Hypertension   . Sleep apnea    on CPAP, USUALLY...CURRENTLY BROKEN    Past Surgical History:  Procedure Laterality Date  . APPENDECTOMY    . CATARACT EXTRACTION W/PHACO Left 10/26/2018   Procedure: CATARACT EXTRACTION PHACO AND INTRAOCULAR LENS PLACEMENT ([place]) LEFT;  Surgeon: Eulogio Bear, MD;  Location: [facility] ORS;  Service: Ophthalmology;  Laterality: Left;  [name] 00:30.7 CDE 20.7 Fluid pack Lot # [id number]  . CIRCUMCISION  Age 8  . COLON SURGERY    . EYE SURGERY Right 1971   correct lazy eye  . HERNIA REPAIR    . HOLEP-LASER ENUCLEATION OF THE PROSTATE WITH MORCELLATION N/A 05/29/2019   Procedure: HOLEP-LASER ENUCLEATION OF THE PROSTATE WITH MORCELLATION;  Surgeon: [name], MD;  Location: [facility] ORS;  Service: Urology;  Laterality: N/A;  . NASAL SEPTUM SURGERY     For deviated septum  . SMALL INTESTINE SURGERY     Adhesions    There were no vitals filed for this visit.  Subjective Assessment - 08/28/19 1009    Subjective  Pt stated he stopped taking the medication ( Mybetriq) on  his own 2 days ago  but he has not inotified his doctor. He noticed he is able to sense his sensation when he has the urge. He also has good flow as well. Spasms and burning have resolved. Pt started drinking more water. Pt has f/u with his sleep study which is scheduled on 09/28/19.  Pt also noticed since he stopped taking Mybetriq, he only got up in the middle of the night 2x instead of 5 x. Pt is also not peeing on himself when geting out of bed.    Pertinent History  Denied LBP but rarely a pain at L side . Denied falls on tailbone. In high school, broke ankle. 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  Hernias 2010         [facility] PT Assessment - 08/28/19 1050      Coordination   Gross Motor Movements are Fluid and Coordinated  --   excessive cues for dissassociation of trunk/pelvis      Palpation   Spinal mobility  decreased thoracic hypomobility                Pelvic Floor Special Questions - 08/28/19 1051    External Palpation  glut  mm overuse w/ cue of pelvic         [facility] Adult PT Treatment/Exercise - 08/28/19 1127      Therapeutic Activites    Therapeutic Activities  --   downgraded band due to c/o soreness in shoulder/ difficulty     Neuro Re-ed    Neuro Re-ed Details   cued for new HEP technique, diassociation of trunk. pelvis , deep core , pelvic floor quick contraction                  PT Long Term Goals - 07/31/19 1004      PT LONG TERM GOAL #1   Title  PT will decrease his IPPS pts from 19pts to < 10 pts , QOL from 6 pts to < 3 pts in order to improve OQL and pelvic function    Time  10    Period  Weeks    Status  New    Target Date  10/06/19      PT LONG TERM GOAL #2   Title  Pt will demo less abdominal scar restrictions, abdominal bulging along hernia, and improved deep core coordination/ strength in order to  increase intraabdominal pressure system for motility and urinary continence    Time  6    Period  Weeks    Status  New    Target Date  09/08/19      PT LONG TERM GOAL #3   Title  Pt will demo proper pelvic floor lengthening and diaphragmatic excursion in order to mininimize burning sensation with urination.    Time  4    Period  Weeks    Status  New    Target Date  08/25/19      PT LONG TERM GOAL #4   Title  Pt will demo increased spinal moblity R sideflexion. rotation, throacic extension, equal pelvic alignment across 2 visits in order to progress to deep core strengthening exericses    Time  2    Period  Weeks    Status  New    Target Date  08/14/19      PT LONG TERM GOAL #5   Title  Pt will report being able to sit for > 30 min and no more episodes of tingling sensation in his penis in orer to perform ADLs    Time  4    Period  Weeks    Status  New    Target Date  08/28/19            Plan - 08/28/19 1311    Clinical Impression Statement  Pt demo'd improve thoracic mobility  And proper deep core coordination. These improvements deemed pt ready to progress to deep core level 1 and 2 exercises. Added resistance band exercise for oblique / thoracolumbar strengthening but pt required a downgrade of grade in resistance to minimize c/o shoulder pain. Pt had no complaint with red band instead of blue. Pt also required excessive cues for dissassociation of trunk/ pelvis. Explained the role of today's exercises for abdominal wall given pt's past abdominal surgeries. Also modified position with use of portable cycler to promote proper alignment.   Pt reported improvement with mm spasms, burning, better urine flow, nocturia from 2 x night after stopping his medication 2 days ago. Pt reported he stopped taking the medication on his own. Recommended pt to notify his MD. Pt voiced understanding.   Plan to reassess pelvic alignment/ SIJ mobility. Pt continues to benefit from skilled  PT.     Personal Factors and Comorbidities  Age;Comorbidity 3+    Comorbidities  HTN, diabetes, OSA.    Examination-Activity Limitations  Continence;Sit    Stability/Clinical Decision Making  Evolving/Moderate complexity    Rehab Potential  Good    PT Frequency  1x / week    PT Duration  Other (comment)   10   PT Treatment/Interventions  Balance training;Neuromuscular re-education;Stair training;Moist Heat;Therapeutic activities;Patient/family education;Manual techniques;Scar mobilization;Taping;Therapeutic exercise;Functional mobility training    Consulted and Agree with Plan of Care  Patient       Patient will benefit from skilled therapeutic intervention in order to improve the following deficits and impairments:  Increased muscle spasms, Decreased strength, Decreased range of motion, Decreased endurance, Decreased activity tolerance, Hypomobility, Decreased safety awareness, Decreased balance, Difficulty walking, Decreased coordination, Improper body mechanics, Pain, Postural dysfunction, Increased fascial restricitons  Visit Diagnosis: Other lack of coordination  Other muscle spasm  Sacrococcygeal disorders, not elsewhere classified     Problem List Patient Active Problem List   Diagnosis Date Noted  . Obstructive sleep apnea syndrome 06/02/2018  . Chronic pain of left ankle 05/13/2017  . Irregular heartbeat 12/24/2016  . Class 1 obesity due to excess calories with serious comorbidity and body mass index (BMI) of 32.0 to 32.9 in adult 05/23/2013  . Essential (primary) hypertension 03/01/2013  . ED (erectile dysfunction) of organic origin 03/01/2013  . Diabetes mellitus, type 2 ([place]) 03/01/2013    Jerl Mina ,PT, DPT, E-RYT  08/28/2019, 1:13 PM  [place] MAIN [facility] SERVICES [address] [place], [place], [address] Phone: [phone]   Fax:  [phone]  Name: Andony Carona MRN: [id number] Date of Birth: March 20, 1951

## 2019-08-29 ENCOUNTER — Encounter: Payer: Self-pay | Admitting: Urology

## 2019-09-04 ENCOUNTER — Other Ambulatory Visit: Payer: Self-pay

## 2019-09-04 ENCOUNTER — Ambulatory Visit: Payer: BC Managed Care – PPO | Admitting: Physical Therapy

## 2019-09-04 VITALS — BP 122/82

## 2019-09-04 DIAGNOSIS — M62838 Other muscle spasm: Secondary | ICD-10-CM

## 2019-09-04 DIAGNOSIS — M533 Sacrococcygeal disorders, not elsewhere classified: Secondary | ICD-10-CM

## 2019-09-04 DIAGNOSIS — R278 Other lack of coordination: Secondary | ICD-10-CM | POA: Diagnosis not present

## 2019-09-04 NOTE — Patient Instructions (Signed)
Maintain consistent water intake    Avoid straining pelvic floor, abdominal muscles , spine  Use log rolling technique instead of getting out of bed with your neck or the sit-up     Log rolling into and out of bed   Log rolling into and out of bed If getting out of bed on R side, Bent knees, scoot hips/ shoulder to L  Raise R arm completely overhead, rolling onto armpit  Then lower bent knees to bed to get into complete side lying position  Then drop legs off bed, and push up onto R elbow/forearm, and use L hand to push onto the bed

## 2019-09-04 NOTE — Therapy (Signed)
[place] MAIN [facility] SERVICES [address] [place], [place], [address] Phone: [phone]   Fax:  [phone]  Physical Therapy Treatment  Patient Details  Name: Luke Coleman MRN: [id number] Date of Birth: 1951-07-30 No data recorded  Encounter Date: 09/04/2019  PT End of Session - 09/04/19 1152    Visit Number  3    Number of Visits  10    Date for PT Re-Evaluation  10/06/19    PT Start Time  1005    PT Stop Time  1106    PT Time Calculation (min)  61 min       Past Medical History:  Diagnosis Date  . Acute bacterial sinusitis 08/22/2014  . COPD (chronic obstructive pulmonary disease) ([place])   . Diabetes mellitus without complication ([place])   . Enlarged prostate   . Essential (primary) hypertension 03/01/2013   Overview:  Last Assessment & Plan:  Taking meds as prescribed. Check bmet. Continue to follow.   Marland Kitchen HTN (hypertension) 03/01/2013  . Hypertension   . Sleep apnea    on CPAP, USUALLY...CURRENTLY BROKEN    Past Surgical History:  Procedure Laterality Date  . APPENDECTOMY    . CATARACT EXTRACTION W/PHACO Left 10/26/2018   Procedure: CATARACT EXTRACTION PHACO AND INTRAOCULAR LENS PLACEMENT ([place]) LEFT;  Surgeon: Eulogio Bear, MD;  Location: [facility] ORS;  Service: Ophthalmology;  Laterality: Left;  [name] 00:30.7 CDE 20.7 Fluid pack Lot # [id number]  . CIRCUMCISION  Age 13  . COLON SURGERY    . EYE SURGERY Right 1971   correct lazy eye  . HERNIA REPAIR    . HOLEP-LASER ENUCLEATION OF THE PROSTATE WITH MORCELLATION N/A 05/29/2019   Procedure: HOLEP-LASER ENUCLEATION OF THE PROSTATE WITH MORCELLATION;  Surgeon: [name], MD;  Location: [facility] ORS;  Service: Urology;  Laterality: N/A;  . NASAL SEPTUM SURGERY     For deviated septum  . SMALL INTESTINE SURGERY     Adhesions    Vitals:   09/04/19 1056 09/04/19 1106  BP: 110/60 122/82    Subjective Assessment - 09/04/19 1010    Subjective  Pt 's urge and muscle spasms are still  better. No issues with exercises.    Pertinent History  Denied LBP but rarely a pain at L side . Denied falls on tailbone. In high school, broke ankle. 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  Hernias 2010         [facility] PT Assessment - 09/04/19 1012      Palpation   SI assessment   R PSIS more anterior  ( pre Tx) , equal pelvic alignment ( post Tx)      Palpation comment  R abdominal fascial restrictions > L  ( decreased post Tx)        Bed Mobility   Bed Mobility  --   half crunch                   [facility] Adult PT Treatment/Exercise - 09/04/19 1150      Therapeutic Activites    Therapeutic Activities  --   discussed maintaining water intake     Neuro Re-ed    Neuro Re-ed Details   cued for proper log rolling 3 trials in and out of bed       Manual Therapy   Manual therapy comments  fascial releases  over B upper and lower quadrant of abdomen                   PT Long Term Goals - 07/31/19 1004      PT LONG TERM GOAL #1   Title  PT will decrease his IPPS pts from 19pts to < 10 pts , QOL from 6 pts to < 3 pts in order to improve OQL and pelvic function    Time  10    Period  Weeks    Status  New    Target Date  10/06/19      PT LONG TERM GOAL #2   Title  Pt will demo less abdominal scar restrictions, abdominal bulging along hernia, and improved deep core coordination/ strength in order to increase intraabdominal pressure system for motility and urinary continence    Time  6    Period  Weeks    Status  New    Target Date  09/08/19      PT LONG TERM GOAL #3   Title  Pt will demo proper pelvic floor lengthening and diaphragmatic excursion in order to mininimize burning sensation with urination.    Time  4    Period  Weeks    Status  New    Target Date  08/25/19      PT LONG TERM GOAL #4   Title  Pt will demo increased  spinal moblity R sideflexion. rotation, throacic extension, equal pelvic alignment across 2 visits in order to progress to deep core strengthening exericses    Time  2    Period  Weeks    Status  New    Target Date  08/14/19      PT LONG TERM GOAL #5   Title  Pt will report being able to sit for > 30 min and no more episodes of tingling sensation in his penis in orer to perform ADLs    Time  4    Period  Weeks    Status  New    Target Date  08/28/19            Plan - 09/04/19 1153    Clinical Impression Statement  Pt demo'd R pelvic anterior rotation which was suspected to be associated with abdominal fascial restrictions. Following manual Tx which pt tolerated without complaints pt demo'd increased trunk rotation and better aligned pelvic girdle. Pt was shown again proper log rolling to minimize straining abdominal and pelvic areas. Pt reported dizziness and weakness with 3rd trial with potential orthostatic hypotension. BP reading  110/60 mm Hg    Pt was seated and provided a bottle of water. Pt also went to the bathroom to remove his mask to get fresh air and returned feeling better without weakness nor dizziness.  BP reading at this time which is his normal levels. 122/82 mm Hg    Pt continues to benefit from skilled PT. Plan to advance abdominal / pelvic exercises.      Personal Factors and Comorbidities  Age;Comorbidity 3+    Comorbidities  HTN, diabetes, OSA.    Examination-Activity Limitations  Continence;Sit    Stability/Clinical Decision Making  Evolving/Moderate complexity    Rehab Potential  Good    PT Frequency  1x / week    PT Duration  Other (comment)   10   PT Treatment/Interventions  Balance training;Neuromuscular re-education;Stair training;Moist Heat;Therapeutic activities;Patient/family education;Manual techniques;Scar mobilization;Taping;Therapeutic exercise;Functional mobility training    Consulted and Agree with Plan of Care  Patient       Patient  will benefit from skilled therapeutic intervention in order to improve the following deficits and impairments:  Increased muscle spasms, Decreased strength, Decreased range of motion, Decreased endurance, Decreased activity tolerance, Hypomobility, Decreased safety awareness, Decreased balance, Difficulty walking, Decreased coordination, Improper body mechanics, Pain, Postural dysfunction, Increased fascial restricitons  Visit Diagnosis: Other lack of coordination  Other muscle spasm  Sacrococcygeal disorders, not elsewhere classified     Problem List Patient Active Problem List   Diagnosis Date Noted  . Obstructive sleep apnea syndrome 06/02/2018  . Chronic pain of left ankle 05/13/2017  . Irregular heartbeat 12/24/2016  . Class 1 obesity due to excess calories with serious comorbidity and body mass index (BMI) of 32.0 to 32.9 in adult 05/23/2013  . Essential (primary) hypertension 03/01/2013  . ED (erectile dysfunction) of organic origin 03/01/2013  . Diabetes mellitus, type 2 ([place]) 03/01/2013    Jerl Mina ,PT, DPT, E-RYT  09/04/2019, 11:54 AM  [place] MAIN [facility] SERVICES [address] [place], [place], [address] Phone: [phone]   Fax:  [phone]  Name: Addam Friedlander MRN: [id number] Date of Birth: 08-15-1951

## 2019-09-11 ENCOUNTER — Ambulatory Visit: Payer: BC Managed Care – PPO | Admitting: Physical Therapy

## 2019-09-11 ENCOUNTER — Other Ambulatory Visit: Payer: Self-pay

## 2019-09-11 DIAGNOSIS — M533 Sacrococcygeal disorders, not elsewhere classified: Secondary | ICD-10-CM

## 2019-09-11 DIAGNOSIS — R278 Other lack of coordination: Secondary | ICD-10-CM

## 2019-09-11 DIAGNOSIS — M62838 Other muscle spasm: Secondary | ICD-10-CM

## 2019-09-11 NOTE — Therapy (Addendum)
[place] MAIN [facility] SERVICES [address] [place], [place], [address] Phone: [phone]   Fax:  [phone]  Patient Details  Name: Luke Coleman MRN: [id number] Date of Birth: 1950-10-28 Referring Provider:  [name], MD  Encounter Date: 09/11/2019   Physical Therapy session was withheld today as pt walked in stating he woke up this morning feeling nauseated. Pt was recommended to monitor his Sx. Asked pt to notify his MD . Physical therapist called pt to keep up with his BP readings. Pt voiced he will contact his MD and will check his BP.      Jerl Mina ,PT, DPT, E-RYT  09/11/2019, 12:34 PM  [place] MAIN [facility] SERVICES [address] [place], [place], [address] Phone: [phone]   Fax:  [phone]

## 2019-09-18 ENCOUNTER — Ambulatory Visit: Payer: BC Managed Care – PPO | Admitting: Physical Therapy

## 2019-10-11 ENCOUNTER — Other Ambulatory Visit: Payer: Self-pay

## 2019-10-11 ENCOUNTER — Ambulatory Visit: Payer: BC Managed Care – PPO | Admitting: Urology

## 2019-10-11 ENCOUNTER — Encounter: Payer: Self-pay | Admitting: Urology

## 2019-10-11 VITALS — BP 118/79 | HR 88 | Ht 67.0 in | Wt 208.0 lb

## 2019-10-11 DIAGNOSIS — N401 Enlarged prostate with lower urinary tract symptoms: Secondary | ICD-10-CM

## 2019-10-11 DIAGNOSIS — Z125 Encounter for screening for malignant neoplasm of prostate: Secondary | ICD-10-CM

## 2019-10-11 DIAGNOSIS — N3946 Mixed incontinence: Secondary | ICD-10-CM | POA: Diagnosis not present

## 2019-10-11 DIAGNOSIS — N138 Other obstructive and reflux uropathy: Secondary | ICD-10-CM

## 2019-10-11 DIAGNOSIS — R3915 Urgency of urination: Secondary | ICD-10-CM

## 2019-10-11 LAB — URINALYSIS, COMPLETE
Bilirubin, UA: NEGATIVE
Ketones, UA: NEGATIVE
Leukocytes,UA: NEGATIVE
Nitrite, UA: NEGATIVE
Protein,UA: NEGATIVE
RBC, UA: NEGATIVE
Specific Gravity, UA: 1.015 (ref 1.005–1.030)
Urobilinogen, Ur: 0.2 mg/dL (ref 0.2–1.0)
pH, UA: 5 (ref 5.0–7.5)

## 2019-10-11 LAB — MICROSCOPIC EXAMINATION
Bacteria, UA: NONE SEEN
Epithelial Cells (non renal): NONE SEEN /hpf (ref 0–10)
RBC, Urine: NONE SEEN /hpf (ref 0–2)
WBC, UA: NONE SEEN /hpf (ref 0–5)

## 2019-10-11 LAB — BLADDER SCAN AMB NON-IMAGING: Scan Result: 46

## 2019-10-11 MED ORDER — DIAZEPAM 10 MG PO TABS: 10.0000 mg | ORAL_TABLET | Freq: Once | ORAL | 0 refills | Status: AC

## 2019-10-11 NOTE — Progress Notes (Signed)
10/11/2019 2:50 PM   Luke Coleman 1951/04/16 [id number]  Referring provider: No referring provider defined for this encounter.  Chief Complaint  Patient presents with  . Benign Prostatic Hypertrophy    HPI: 69 year old male with personal history of BPH refractory urinary symptoms/incomplete bladder emptying status post holmium laser enucleation of the prostate 05/2019 returns today for follow-up.  He continues to take finasteride and Cialis.  Notably, he continues have urinary urgency and urge incontinence.  He is still wearing a diaper.  Is extremely bothersome to him.  He reports that he will just be sitting and suddenly void fairly large volumes both with and without a preceding urge.  This happens throughout the day.  It does not particularly happen at nighttime.  He still wearing pull-ups for this and is bothersome.  He is no longer having bladder spasms.  He quit taking oxybutynin Myrbetriq because the bladder spasm subsided.  He has very little ongoing stress incontinence.  He occasionally will leak a small amount when he sneezes but this is improving.  He worked with physical therapy on 4 separate occasions but was having trouble getting off of work thus stopped going.  He also did not see any marked improvement while he was doing this therapy.  He denies any dysuria or gross hematuria.  He does report that he had to resume Jardiance due to the expense of his insulin.  He believes that his blood sugars are under less good control than previous.  IPSS    Row Name 10/11/19 1400         International Prostate Symptom Score   How often have you had the sensation of not emptying your bladder?  About half the time     How often have you had to urinate less than every two hours?  Almost always     How often have you found it difficult to postpone urination?  Almost always     How often have you had a weak urinary stream?  Less than half the time     How often have you had to  strain to start urination?  Less than 1 in 5 times     How many times did you typically get up at night to urinate?  4 Times     Total IPSS Score  20       Quality of Life due to urinary symptoms   If you were to spend the rest of your life with your urinary condition just the way it is now how would you feel about that?  Mostly Disatisfied        Score:  1-7 Mild 8-19 Moderate 20-35 Severe   PMH: Past Medical History:  Diagnosis Date  . Acute bacterial sinusitis 08/22/2014  . COPD (chronic obstructive pulmonary disease) ([place])   . Diabetes mellitus without complication ([place])   . Enlarged prostate   . Essential (primary) hypertension 03/01/2013   Overview:  Last Assessment & Plan:  Taking meds as prescribed. Check bmet. Continue to follow.   Marland Kitchen HTN (hypertension) 03/01/2013  . Hypertension   . Sleep apnea    on CPAP, USUALLY...CURRENTLY BROKEN    Surgical History: Past Surgical History:  Procedure Laterality Date  . APPENDECTOMY    . CATARACT EXTRACTION W/PHACO Left 10/26/2018   Procedure: CATARACT EXTRACTION PHACO AND INTRAOCULAR LENS PLACEMENT ([place]) LEFT;  Surgeon: Eulogio Bear, MD;  Location: [facility] ORS;  Service: Ophthalmology;  Laterality: Left;  [name] 00:30.7 CDE 20.7 Fluid pack  Lot # [id number]  . CIRCUMCISION  Age 78  . COLON SURGERY    . EYE SURGERY Right 1971   correct lazy eye  . HERNIA REPAIR    . HOLEP-LASER ENUCLEATION OF THE PROSTATE WITH MORCELLATION N/A 05/29/2019   Procedure: HOLEP-LASER ENUCLEATION OF THE PROSTATE WITH MORCELLATION;  Surgeon: [name], MD;  Location: [facility] ORS;  Service: Urology;  Laterality: N/A;  . NASAL SEPTUM SURGERY     For deviated septum  . SMALL INTESTINE SURGERY     Adhesions    Home Medications:  Allergies as of 10/11/2019      Reactions   Sulfa Antibiotics Rash   Penile rash      Medication List       Accurate as of October 11, 2019  2:50 PM. If you have any questions, ask your nurse or doctor.        STOP  taking these medications   mirabegron ER 50 MG Tb24 tablet Commonly known as: MYRBETRIQ Stopped by: [name], MD   oxybutynin 15 MG 24 hr tablet Commonly known as: DITROPAN XL Stopped by: [name], MD     TAKE these medications   diazepam 10 MG tablet Commonly known as: Valium Take 1 tablet (10 mg total) by mouth once for 1 dose. Take 1 hour prior to procedure. Please have a driver available. Started by: [name], MD   finasteride 5 MG tablet Commonly known as: Proscar Take 1 tablet (5 mg total) by mouth daily.   fluticasone 50 MCG/ACT nasal spray Commonly known as: FLONASE SHAKE LIQUID AND USE 2 SPRAYS IN EACH NOSTRIL DAILY   glucose blood test strip Commonly known as: ONE TOUCH ULTRA TEST USE TO TEST THREE TIMES DAILY   JARDIANCE PO Take by mouth.   Lancets 28G Misc 3 (three) times daily. Use as instructed. DX: E11.9   lisinopril-hydrochlorothiazide 20-12.5 MG tablet Commonly known as: ZESTORETIC TAKE 1 TABLET BY MOUTH DAILY   tadalafil 5 MG tablet Commonly known as: CIALIS Take 1 tablet (5 mg total) by mouth daily as needed for erectile dysfunction.   Trulicity 1.5 0000000 Sopn Generic drug: Dulaglutide Inject 1.5 mg into the skin every 7 (seven) days.       Allergies:  Allergies  Allergen Reactions  . Sulfa Antibiotics Rash    Penile rash    Family History: Family History  Problem Relation Age of Onset  . Early death Mother        MVA - died age 63  . Alcohol abuse Father   . Hypertension Father   . Cancer Father 53       renal cell carcinoma  . Heart disease Father 32       MI - died  . Diabetes Brother   . Sleep apnea Brother   . Sleep apnea Daughter   . Sleep apnea Brother   . Diabetes Maternal Uncle   . Diabetes Paternal Aunt   . Kidney disease Maternal Grandmother     Social History:  reports that he has never smoked. He has never used smokeless tobacco. He reports current alcohol use. He reports that he does not  use drugs.   Physical Exam: BP 118/79   Pulse 88   Ht 5\' 7"  (1.702 m)   Wt 208 lb (94.3 kg)   BMI 32.58 kg/m   Constitutional:  Alert and oriented, No acute distress. HEENT: [place] AT, moist mucus membranes.  Trachea midline, no masses. Cardiovascular: No clubbing, cyanosis, or edema.  Respiratory: Normal respiratory effort, no increased work of breathing. GI: Abdomen is soft, nontender, nondistended, no abdominal masses GU: No CVA tenderness Lymph: No cervical or inguinal lymphadenopathy. Skin: No rashes, bruises or suspicious lesions. Neurologic: Grossly intact, no focal deficits, moving all 4 extremities. Psychiatric: Normal mood and affect.  Laboratory Data: Lab Results  Component Value Date   WBC 8.1 05/25/2019   HGB 15.4 05/25/2019   HCT 45.3 05/25/2019   MCV 92.6 05/25/2019   PLT 180 05/25/2019    Lab Results  Component Value Date   CREATININE 1.28 (H) 05/25/2019    Lab Results  Component Value Date   PSA 1.4 06/02/2018   PSA 0.47 05/13/2017    Lab Results  Component Value Date   HGBA1C 6.6 (A) 06/02/2018   Urinalysis today negative, 3+ glucose.  Pertinent Imaging: Results for orders placed or performed in visit on 10/11/19  Bladder Scan (Post Void Residual) in office  Result Value Ref Range   Scan Result 46 ml     Assessment & Plan:    1. BPH with obstruction/lower urinary tract symptoms Significant improvement in obstructive symptoms following outlet procedure, see below for further discussion  We will keep him on these medications in order to address below - Urinalysis, Complete - Bladder Scan (Post Void Residual) in office  2. Prostate cancer screening Overdue for PSA screening, will get this today  3. Urinary urgency Worsening urinary urgency and urge incontinence  This may be multifactorial given resumption of Jardiance and blood sugar issues.  We also discussed that following outlet procedure, bladder overactivity sometimes can be  exacerbated specially patients with diabetes.  Incidentally, there is 3+ glucose in his urine today.  I recommended resuming Myrbetriq 50 mg for she was given 2 box of samples today.  We will see if this helps anything.  We discussed that if refractory symptoms continue, may consider second line option such as Botox.  I also recommended repeat cystoscopy.  There is a discrepancy between the perceived resection volume versus the pathology volume this would like to ensure that there is a nice outline defect without any hanging tissue or any other contributing factors to his ongoing issues.  4. Mixed incontinence As above, minimal stress incontinence   Return in about 2 weeks (around 10/25/2019) for cysto (valium).  [name], MD  [facility] [address] [place], [place] [address] [phone]

## 2019-10-12 LAB — PSA: Prostate Specific Ag, Serum: <0.1 ng/mL (ref 0.0–4.0)

## 2019-10-16 ENCOUNTER — Telehealth: Payer: Self-pay | Admitting: *Deleted

## 2019-10-16 NOTE — Telephone Encounter (Addendum)
Patient informed, verbalized understanding.    ----- Message from [name], MD sent at 10/14/2019  3:01 PM EST ----- Wow! Your PSA is actually undetectable.    I also wanted to mention that when you were here, your urine had 3+ glucose which is sugar spilling over into your urine.  This means that your blood sugar is under pretty poor control right now.  This is likely contributing factor to your urinary symptoms.  Please work with your primary to get this under better control.

## 2019-10-19 ENCOUNTER — Ambulatory Visit: Payer: BC Managed Care – PPO | Admitting: Pulmonary Disease

## 2019-10-19 ENCOUNTER — Other Ambulatory Visit: Payer: Self-pay

## 2019-10-19 ENCOUNTER — Encounter: Payer: Self-pay | Admitting: Pulmonary Disease

## 2019-10-19 VITALS — BP 142/80 | HR 97 | Temp 98.2°F | Ht 67.0 in | Wt 211.0 lb

## 2019-10-19 DIAGNOSIS — R0683 Snoring: Secondary | ICD-10-CM

## 2019-10-19 NOTE — Progress Notes (Signed)
[facility]  Chief Complaint  Patient presents with  . sleep consult    per Dr. Rockey Situ-- prior sleep study 07/2019. hasn't worn cpap in years. c/o daytime sleepness, loud snoring and restless.     Constitutional:  BP (!) 142/80 (BP Location: Left Arm, Cuff Size: Normal)   Pulse 97   Temp 98.2 F (36.8 C) (Temporal)   Ht 5\' 7"  (1.702 m)   Wt 211 lb (95.7 kg)   SpO2 98%   BMI 33.05 kg/m   Past Medical History:  DM, HTN, Allergies, BPH  Brief Summary:  Luke Coleman is a 69 y.o. male with snoring.  He had a sleep study about 20 years ago and started on CPAP.  He moved and wasn't able to get set up for new supplies.  His CPAP broke about 5 yrs ago.  His sleep has been much worse since not having CPAP.  He tried doing an in lab study recently, but this was scheduled shortly after he had prostate surgery and couldn't get enough sleep during the test.  He wakes up feeling like his throat is closing and hears himself snoring.  Wakes up more on his back, and during dreams.    He goes to sleep at 10 pm.  He falls asleep in 5 minutes.  He wakes up 4 or 5 times to use the bathroom.  He gets out of bed at 6 am.  He feels tired in the morning.  He gets morning headache sometimes.  He does not use anything to help him fall sleep.  Drinks a Coke in the morning to help wake up.  He denies sleep walking, sleep talking, bruxism, or nightmares.  There is no history of restless legs.  He denies sleep hallucinations, sleep paralysis, or cataplexy.  The Epworth score is 11 out of 24.  He has several relatives who use CPAP   Physical Exam:   Appearance - well kempt   ENMT - clear nasal mucosa, midline nasal  septum, no oral exudates, no LAN, trachea midline, MP 4, enlarged tongue, retrognathic  Respiratory - normal chest wall, normal respiratory effort, no accessory muscle use, no wheeze/rales  CV - s1s2 regular rate and rhythm, no murmurs, no  peripheral edema, radial pulses symmetric  GI - soft, non tender, no masses  Lymph - no adenopathy noted in neck and axillary areas  MSK - normal gait  Ext - no cyanosis, clubbing, or joint inflammation noted  Skin - no rashes, lesions, or ulcers  Neuro - normal strength, oriented x 3  Psych - normal mood and affect  Labs from 05/25/19 >> creatinine 1.28, CO2 23, Hb 15.4  Lab Results  Component Value Date   TSH 1.00 11/05/2017    Discussion:  He has snoring, sleep disruption, apnea, and daytime sleepiness.  He has history of hypertension and diabetes.  He has family history of sleep apnea and had prior sleep study showing sleep apnea.  I am concerned he still has sleep apnea and likely has progressed since his original diagnosis 20 years ago.  Assessment/Plan:   Snoring with excessive daytime sleepiness. - will need to arrange for a home sleep study - various therapies for treatment were reviewed: CPAP, oral appliance, and surgical interventions - if he still has significant sleep apnea, plan would be to then arrange for auto CPAP 5 to 20 cm H2O to start with  Obesity. - discussed how weight can impact sleep and risk for  sleep disordered breathing - discussed options to assist with weight loss: combination of diet modification, cardiovascular and strength training exercises  Cardiovascular risk. - had an extensive discussion regarding the adverse health consequences related to untreated sleep disordered breathing - specifically discussed the risks for hypertension, coronary artery disease, cardiac dysrhythmias, cerebrovascular disease, and diabetes - lifestyle modification discussed  Safe driving practices. - discussed how sleep disruption can increase risk of accidents, particularly when driving - safe driving practices were discussed    Patient Instructions  Will arrange for home sleep study Will call to arrange for follow up after sleep study reviewed   Time  spent 32 minutes  Chesley Mires, MD [place] Pager: [phone] 10/19/2019, 11:02 AM  Flow Sheet    Sleep tests:    Medications:   Allergies as of 10/19/2019      Reactions   Sulfa Antibiotics Rash   Penile rash      Medication List       Accurate as of October 19, 2019 11:02 AM. If you have any questions, ask your nurse or doctor.        finasteride 5 MG tablet Commonly known as: Proscar Take 1 tablet (5 mg total) by mouth daily.   fluticasone 50 MCG/ACT nasal spray Commonly known as: FLONASE SHAKE LIQUID AND USE 2 SPRAYS IN EACH NOSTRIL DAILY   glucose blood test strip Commonly known as: ONE TOUCH ULTRA TEST USE TO TEST THREE TIMES DAILY   JARDIANCE PO Take by mouth.   Lancets 28G Misc 3 (three) times daily. Use as instructed. DX: E11.9   lisinopril-hydrochlorothiazide 20-12.5 MG tablet Commonly known as: ZESTORETIC TAKE 1 TABLET BY MOUTH DAILY   tadalafil 5 MG tablet Commonly known as: CIALIS Take 1 tablet (5 mg total) by mouth daily as needed for erectile dysfunction.   Trulicity 1.5 0000000 Sopn Generic drug: Dulaglutide Inject 1.5 mg into the skin every 7 (seven) days.       Past Surgical History:  He  has a past surgical history that includes Appendectomy; Hernia repair; Small intestine surgery; Nasal septum surgery; Circumcision (Age 76); Colon surgery; Cataract extraction w/PHACO (Left, 10/26/2018); Eye surgery (Right, 1971); and Holep-laser enucleation of the prostate with morcellation (N/A, 05/29/2019).  Family History:  His family history includes Alcohol abuse in his father; Cancer (age of onset: 57) in his father; Diabetes in his brother, maternal uncle, and paternal aunt; Early death in his mother; Heart disease (age of onset: 40) in his father; Hypertension in his father; Kidney disease in his maternal grandmother; Sleep apnea in his brother, brother, and daughter.  Social History:  He  reports that he has never  smoked. He has never used smokeless tobacco. He reports current alcohol use. He reports that he does not use drugs.

## 2019-10-19 NOTE — Patient Instructions (Signed)
Will arrange for home sleep study Will call to arrange for follow up after sleep study reviewed  

## 2019-10-22 ENCOUNTER — Ambulatory Visit: Payer: BC Managed Care – PPO | Attending: Internal Medicine

## 2019-10-22 DIAGNOSIS — Z23 Encounter for immunization: Secondary | ICD-10-CM

## 2019-10-22 NOTE — Progress Notes (Signed)
   Covid-19 Vaccination Clinic  Name:  Luke Coleman    MRN: [id number] DOB: [birth year]  10/22/2019  Mr. Luke Coleman was observed post Covid-19 immunization for 15 minutes without incidence. He was provided with Vaccine Information Sheet and instruction to access the V-Safe system.   Mr. Luke Coleman was instructed to call 911 with any severe reactions post vaccine: Marland Kitchen Difficulty breathing  . Swelling of your face and throat  . A fast heartbeat  . A bad rash all over your body  . Dizziness and weakness    Immunizations Administered    Name Date Dose VIS Date Route   Pfizer COVID-19 Vaccine 10/22/2019  8:32 AM 0.3 mL 08/04/2019 Intramuscular   Manufacturer: [place]   Lot: [id number]   [place]: [id number]

## 2019-10-28 ENCOUNTER — Encounter: Payer: Self-pay | Admitting: Urology

## 2019-11-01 ENCOUNTER — Encounter: Payer: Self-pay | Admitting: Urology

## 2019-11-01 ENCOUNTER — Other Ambulatory Visit: Payer: Self-pay

## 2019-11-01 ENCOUNTER — Ambulatory Visit (INDEPENDENT_AMBULATORY_CARE_PROVIDER_SITE_OTHER): Payer: BC Managed Care – PPO | Admitting: Urology

## 2019-11-01 VITALS — BP 145/86 | HR 94 | Ht 67.0 in | Wt 213.0 lb

## 2019-11-01 DIAGNOSIS — R3915 Urgency of urination: Secondary | ICD-10-CM

## 2019-11-01 DIAGNOSIS — N393 Stress incontinence (female) (male): Secondary | ICD-10-CM

## 2019-11-01 NOTE — Progress Notes (Signed)
   11/01/19  CC:  Chief Complaint  Patient presents with  . Cysto    HPI: 69 year old male with ongoing incontinence, primarily urge approximately component of stress following holmium laser enucleation of the prostate in 05/2019.  He presents today for cystoscopic evaluation.  Since last visit, has been working to get his blood sugars under better control.  He has been taking the Myrbetriq samples which he believes have is helped.  He is much drier at nighttime.  Is also working to get a CPAP.  He continues to have to wear a diaper and is primarily bothered by daytime symptoms, both urgency and urge incontinence and possibly a small amount of stress incontinence.  Please previous notes for details.  Blood pressure (!) 145/86, pulse 94, height 5\' 7"  (1.702 m), weight 213 lb (96.6 kg). NED. A&Ox3.   No respiratory distress   Abd soft, NT, ND Normal phallus with bilateral descended testicles  Cystoscopy Procedure Note  Patient identification was confirmed, informed consent was obtained, and patient was prepped using Betadine solution.  Lidocaine jelly was administered per urethral meatus.     Pre-Procedure: - Inspection reveals a normal caliber ureteral meatus.  Procedure: The flexible cystoscope was introduced without difficulty - No urethral strictures/lesions are present. - Smooth widely patent prostatic fossa prostate, coaptation of sphincter and small amount of residual apical tissue - Normal bladder neck - Bilateral ureteral orifices identified - Bladder mucosa  reveals no ulcers, tumors, or lesions - No bladder stones - No trabeculation  Retroflexion shows HoLEP defect   Post-Procedure: - Patient tolerated the procedure well  Assessment/ Plan:  1. Urinary urgency/urge incontinence Likely multifactorial exacerbated by outlet procedure, poorly controlled blood sugar, obesity, untreated sleep apnea  Cystoscopically today, prostatic fossa is widely patent and  bladder appears normal without any foreign body or any other concerning symptoms.  There is coaptation of his sphincter with some residual apical tissue which is reassuring.  Continue Myrbetriq as this seems to be helping a little bit, given additional samples of 50 mg x 4 weeks.  He is seeing his diabetes doctor in the next couple of weeks, urged him to work with this physician to get his blood sugar under better control as this is contributing factor.  Also recommend following up to get a CPAP this is also likely contributing.  If his symptoms fail to resolve with multimodal treatment as above, would recommend consideration of Botox for severe overactivity.  He will consider this but is concerned about cost.  We will reassess in 4 weeks via virtual visit.  - Urinalysis, Complete  2. Stress incontinence (male) Continue pelvic floor exercises, suspect #1 his primary issue    Return in about 4 weeks (around 11/29/2019) for virtual visit for symptoms recheck.  [name], MD

## 2019-11-02 LAB — URINALYSIS, COMPLETE
Bilirubin, UA: NEGATIVE
Ketones, UA: NEGATIVE
Leukocytes,UA: NEGATIVE
Nitrite, UA: NEGATIVE
Protein,UA: NEGATIVE
RBC, UA: NEGATIVE
Specific Gravity, UA: 1.025 (ref 1.005–1.030)
Urobilinogen, Ur: 0.2 mg/dL (ref 0.2–1.0)
pH, UA: 5.5 (ref 5.0–7.5)

## 2019-11-02 LAB — MICROSCOPIC EXAMINATION
Bacteria, UA: NONE SEEN
RBC, Urine: NONE SEEN /hpf (ref 0–2)

## 2019-11-09 ENCOUNTER — Encounter: Payer: Self-pay | Admitting: Urology

## 2019-11-13 ENCOUNTER — Ambulatory Visit: Payer: BC Managed Care – PPO | Attending: Internal Medicine

## 2019-11-13 DIAGNOSIS — Z23 Encounter for immunization: Secondary | ICD-10-CM

## 2019-11-13 NOTE — Progress Notes (Signed)
   Covid-19 Vaccination Clinic  Name:  Luke Coleman    MRN: [id number] DOB: 28-Mar-1951  11/13/2019  Luke Coleman was observed post Covid-19 immunization for 15 minutes without incident. He was provided with Vaccine Information Sheet and instruction to access the V-Safe system.   Luke Coleman was instructed to call 911 with any severe reactions post vaccine: Marland Kitchen Difficulty breathing  . Swelling of face and throat  . A fast heartbeat  . A bad rash all over body  . Dizziness and weakness   Immunizations Administered    Name Date Dose VIS Date Route   Pfizer COVID-19 Vaccine 11/13/2019  8:21 AM 0.3 mL 08/04/2019 Intramuscular   Manufacturer: [place]   Lot: [id number]   [place]: [id number]

## 2019-11-14 ENCOUNTER — Other Ambulatory Visit: Payer: Self-pay

## 2019-11-14 ENCOUNTER — Ambulatory Visit: Payer: BC Managed Care – PPO

## 2019-11-14 DIAGNOSIS — G4733 Obstructive sleep apnea (adult) (pediatric): Secondary | ICD-10-CM | POA: Diagnosis not present

## 2019-11-14 DIAGNOSIS — R0683 Snoring: Secondary | ICD-10-CM

## 2019-11-27 NOTE — Telephone Encounter (Signed)
VS please advise if you have sleep study results.

## 2019-11-28 DIAGNOSIS — G4733 Obstructive sleep apnea (adult) (pediatric): Secondary | ICD-10-CM | POA: Diagnosis not present

## 2019-11-28 NOTE — Telephone Encounter (Signed)
HST 11/14/19 >> AHI 14, SpO2 low 79%   Please inform him that his sleep study shows mild obstructive sleep apnea.  Please arrange for ROV with me or NP to discuss treatment options.

## 2019-11-29 ENCOUNTER — Other Ambulatory Visit: Payer: Self-pay

## 2019-11-29 ENCOUNTER — Telehealth (INDEPENDENT_AMBULATORY_CARE_PROVIDER_SITE_OTHER): Payer: BC Managed Care – PPO | Admitting: Urology

## 2019-11-29 ENCOUNTER — Encounter: Payer: Self-pay | Admitting: Urology

## 2019-11-29 DIAGNOSIS — N401 Enlarged prostate with lower urinary tract symptoms: Secondary | ICD-10-CM

## 2019-11-29 DIAGNOSIS — N138 Other obstructive and reflux uropathy: Secondary | ICD-10-CM

## 2019-11-29 DIAGNOSIS — N393 Stress incontinence (female) (male): Secondary | ICD-10-CM | POA: Diagnosis not present

## 2019-11-29 DIAGNOSIS — R3915 Urgency of urination: Secondary | ICD-10-CM

## 2019-11-29 MED ORDER — MIRABEGRON ER 50 MG PO TB24: 50.0000 mg | ORAL_TABLET | Freq: Every day | ORAL | 11 refills | Status: DC

## 2019-11-29 NOTE — Progress Notes (Signed)
Virtual Visit via Telephone Note  I connected with Luke Coleman on 11/29/19 at  4:15 PM EDT by telephone and verified that I am speaking with the correct person using two identifiers.  Location: Patient: home Provider: office   I discussed the limitations, risks, security and privacy concerns of performing an evaluation and management service by telephone and the availability of in person appointments. I also discussed with the patient that there may be a patient responsible charge related to this service. The patient expressed understanding and agreed to proceed.   History of Present Illness: Luke Coleman is a 69 y.o. with a hx of ongoing incontinence, primarily urge approximately component of stress following holmium laser enucleation of the prostate in 05/2019 returns today for 4 week f/u.   He experienced urge incontinence and stress incontinence after HoLEP.   He had a cysto last month revealing a widely patent prostatic fossa and coaptation of sphincter. Normal bladder also noted.   He reports of dramatic improvement in his urination since cysto primarily less leakage when he sits down. Nocturia previously x5 now x2. He also reports of less urgency and frequency. He is able to start and stop his stream better than before. He significantly decreased his usage of pads and is overall pleased.   Symptom improvement occurred in conjunction with resuming Myrbetriq 50 mg.   In the interim, he underwent a sleep study and was found to have sleep apnea. He has been fitted for a CPAP.   Observations/Objective: Pt is engaged and asking good questions.   Assessment and Plan:  1. Mixed urinary incontinence  Encouraged to continue kegel exercises  Continue Myrbetriq 50 mg, refilled today Discussed botox for severe urge incontinence if significant bother  Encouraged to continue glucose control, sleep apnea moderation and other factors playing a role in his urinary symptoms  F/u based on  mychart message next month   Follow Up Instructions: Will contact Luke Coleman via my chart next month to let Luke Coleman know how he is doing   I discussed the assessment and treatment plan with the patient. The patient was provided an opportunity to ask questions and all were answered. The patient agreed with the plan and demonstrated an understanding of the instructions.   The patient was advised to call back or seek an in-person evaluation if the symptoms worsen or if the condition fails to improve as anticipated.  I provided 20 minutes of non-face-to-face time during this encounter.  Jamas Lav, am acting as a scribe for Dr. [name],  I have reviewed the above documentation for accuracy and completeness, and I agree with the above.   [name], MD

## 2019-12-01 NOTE — Telephone Encounter (Signed)
ATC patient.  LMTCB. 

## 2019-12-01 NOTE — Telephone Encounter (Signed)
Dr. Halford Chessman please advise on patients mychart message:  Do I need a follow up visit after sleep study? Sleep study was done on 11/14/2019.

## 2019-12-04 NOTE — Telephone Encounter (Signed)
ATC patient x2 LMTCB 

## 2019-12-07 NOTE — Telephone Encounter (Signed)
Patient is scheduled for an appt with Dr. Halford Chessman on 5/4 at 9:30am.  Nothing further needed at this time

## 2019-12-18 ENCOUNTER — Other Ambulatory Visit: Payer: Self-pay

## 2019-12-18 ENCOUNTER — Telehealth (INDEPENDENT_AMBULATORY_CARE_PROVIDER_SITE_OTHER): Payer: BC Managed Care – PPO | Admitting: Family Medicine

## 2019-12-18 ENCOUNTER — Telehealth: Payer: Self-pay

## 2019-12-18 ENCOUNTER — Encounter: Payer: Self-pay | Admitting: Family Medicine

## 2019-12-18 DIAGNOSIS — R109 Unspecified abdominal pain: Secondary | ICD-10-CM

## 2019-12-18 DIAGNOSIS — R110 Nausea: Secondary | ICD-10-CM

## 2019-12-18 DIAGNOSIS — A084 Viral intestinal infection, unspecified: Secondary | ICD-10-CM | POA: Diagnosis not present

## 2019-12-18 MED ORDER — DICYCLOMINE HCL 10 MG PO CAPS: 10.0000 mg | ORAL_CAPSULE | Freq: Three times a day (TID) | ORAL | 0 refills | Status: DC

## 2019-12-18 MED ORDER — ONDANSETRON HCL 4 MG PO TABS: 4.0000 mg | ORAL_TABLET | Freq: Three times a day (TID) | ORAL | 0 refills | Status: DC | PRN

## 2019-12-18 NOTE — Patient Instructions (Addendum)
I have sent in prescriptions for Bentyl, to take 1 tablet every 8 hours as needed for abdominal cramping.  Also, sent in a prescription for Ondansetron 4mg  to take 1 tablet every 8 hours as needed for nausea.  Once the nausea medication has started to take effect to begin working on increasing oral intake of fluids/foods.  If you are unable to tolerate foods/fluids by mouth, with the medications, to proceed to the emergency department immediately!  We will plan to see you back in 1 month for your physical.  You will receive a survey after today's visit either digitally by e-mail or paper by [place] mail. Your experiences and feedback matter to [name].  Please respond so we know how we are doing as we provide care for you.  Call us with any questions/concerns/needs.  It is my goal to be available to you for your health concerns.  Thanks for choosing me to be a partner in your healthcare needs!  Harlin Rain, FNP-C Family Nurse Practitioner [facility] Phone: [phone]

## 2019-12-18 NOTE — Assessment & Plan Note (Signed)
Viral gastroenteritis beginning 12/15/2019.  Discussed many cases of viral gastroenteritis locally at present time.  Sent in Rx for Bentyl and Ondansetron to help manage symptoms and then to increase PO intake of fluids/foods slowly.  If unable to tolerate foods/fluids to proceed to the emergency room immediately.  Plan: 1. Follow up as needed.

## 2019-12-18 NOTE — Telephone Encounter (Signed)
Attempted to contact the patient for his telehealth appt scheduled at 1:40pm. No answer, lmom.

## 2019-12-18 NOTE — Progress Notes (Signed)
Virtual Visit via Telephone  The purpose of this virtual visit is to provide medical care while limiting exposure to the novel coronavirus (COVID19) for both patient and office staff.  Consent was obtained for phone visit:  Yes.   Answered questions that patient had about telehealth interaction:  Yes.   I discussed the limitations, risks, security and privacy concerns of performing an evaluation and management service by telephone. I also discussed with the patient that there may be a patient responsible charge related to this service. The patient expressed understanding and agreed to proceed.  Patient is at home and is accessed via telephone Services are provided by Harlin Rain, FNP-C from [facility])  ---------------------------------------------------------------------- Chief Complaint  Patient presents with  . Nausea    intermittent mid abdominal pain, nauseated, vomiting x2, but subsided and diarrhea x 3 days.   . Mouth Lesions    pt requesting a prescription of valtrex    S: Reviewed CMA documentation. I have called patient and gathered additional HPI as follows:  Luke Coleman presents for telemedicine visit with concerns of stomach concerns since Friday.  Reports on Friday he started with an upset stomach.  Saturday he started to have vomiting and diarrhea and Sunday believed he was feeling better but today he feels like he needs to vomit but can't.  Has taken some gas x with some relief in symptoms.  Reports still has some nausea and abdominal cramping that he is hoping to have medicated.  Patient is currently home Denies any high risk travel to areas of current concern for COVID19. Denies any known or suspected exposure to person with or possibly with COVID19.  Denies any fevers, chills, sweats, body ache, cough, shortness of breath, sinus pain or pressure, headache, or exposure to anyone with similar symptoms.  Past Medical History:  Diagnosis  Date  . Acute bacterial sinusitis 08/22/2014  . COPD (chronic obstructive pulmonary disease) ([place])   . Diabetes mellitus without complication ([place])   . Enlarged prostate   . Essential (primary) hypertension 03/01/2013   Overview:  Last Assessment & Plan:  Taking meds as prescribed. Check bmet. Continue to follow.   Marland Kitchen HTN (hypertension) 03/01/2013  . Hypertension   . Sleep apnea    on CPAP, USUALLY...CURRENTLY BROKEN   Social History   Tobacco Use  . Smoking status: Never Smoker  . Smokeless tobacco: Never Used  Substance Use Topics  . Alcohol use: Yes    Alcohol/week: 0.0 standard drinks    Comment: Occasional  . Drug use: No    Current Outpatient Medications:  .  Dulaglutide (TRULICITY) 1.5 0000000 SOPN, Inject 1.5 mg into the skin every 7 (seven) days. , Disp: , Rfl:  .  fluticasone (FLONASE) 50 MCG/ACT nasal spray, SHAKE LIQUID AND USE 2 SPRAYS IN EACH NOSTRIL DAILY, Disp: 16 g, Rfl: 6 .  glucose blood (ONE TOUCH ULTRA TEST) test strip, USE TO TEST THREE TIMES DAILY, Disp: 100 each, Rfl: 11 .  Lancets 28G MISC, 3 (three) times daily. Use as instructed. DX: E11.9, Disp: , Rfl:  .  lisinopril-hydrochlorothiazide (ZESTORETIC) 20-12.5 MG tablet, TAKE 1 TABLET BY MOUTH DAILY, Disp: 90 tablet, Rfl: 1 .  mirabegron ER (MYRBETRIQ) 50 MG TB24 tablet, Take 1 tablet (50 mg total) by mouth daily., Disp: 30 tablet, Rfl: 11 .  tadalafil (CIALIS) 5 MG tablet, Take 1 tablet (5 mg total) by mouth daily as needed for erectile dysfunction., Disp: 90 tablet, Rfl: 3 .  dicyclomine (  BENTYL) 10 MG capsule, Take 1 capsule (10 mg total) by mouth 4 (four) times daily -  before meals and at bedtime for 10 days., Disp: 40 capsule, Rfl: 0 .  ondansetron (ZOFRAN) 4 MG tablet, Take 1 tablet (4 mg total) by mouth every 8 (eight) hours as needed for nausea or vomiting., Disp: 20 tablet, Rfl: 0  Depression screen [facility] 2/9 12/18/2019 07/28/2018 11/05/2017  Decreased Interest 0 0 0  Down, Depressed, Hopeless 0 0 0   PHQ - 2 Score 0 0 0    No flowsheet data found.  -------------------------------------------------------------------------- O: No physical exam performed due to remote telephone encounter.  Physical Exam: Patient remotely monitored without video.  Verbal communication appropriate.  Cognition normal.   Recent Results (from the past 2160 hour(s))  Urinalysis, Complete     Status: Abnormal   Collection Time: 10/11/19  1:56 PM  Result Value Ref Range   Specific Gravity, UA 1.015 1.005 - 1.030   pH, UA 5.0 5.0 - 7.5   Color, UA Yellow Yellow   Appearance Ur Cloudy (A) Clear   Leukocytes,UA Negative Negative   Protein,UA Negative Negative/Trace   Glucose, UA 3+ (A) Negative   Ketones, UA Negative Negative   RBC, UA Negative Negative   Bilirubin, UA Negative Negative   Urobilinogen, Ur 0.2 0.2 - 1.0 mg/dL   Nitrite, UA Negative Negative   Microscopic Examination See below:   Microscopic Examination     Status: None   Collection Time: 10/11/19  1:56 PM   URINE  Result Value Ref Range   WBC, UA None seen 0 - 5 /hpf   RBC None seen 0 - 2 /hpf   Epithelial Cells (non renal) None seen 0 - 10 /hpf   Bacteria, UA None seen None seen/Few  Bladder Scan (Post Void Residual) in office     Status: None   Collection Time: 10/11/19  2:25 PM  Result Value Ref Range   Scan Result 46 ml   PSA     Status: None   Collection Time: 10/11/19  2:55 PM  Result Value Ref Range   Prostate Specific Ag, Serum <0.1 0.0 - 4.0 ng/mL    Comment: Roche ECLIA methodology. According to the American Urological Association, Serum PSA should decrease and remain at undetectable levels after radical prostatectomy. The AUA defines biochemical recurrence as an initial PSA value 0.2 ng/mL or greater followed by a subsequent confirmatory PSA value 0.2 ng/mL or greater. Values obtained with different assay methods or kits cannot be used interchangeably. Results cannot be interpreted as absolute evidence of the  presence or absence of malignant disease.   Urinalysis, Complete     Status: Abnormal   Collection Time: 11/01/19  2:58 PM  Result Value Ref Range   Specific Gravity, UA 1.025 1.005 - 1.030   pH, UA 5.5 5.0 - 7.5   Color, UA Yellow Yellow   Appearance Ur Clear Clear   Leukocytes,UA Negative Negative   Protein,UA Negative Negative/Trace   Glucose, UA Trace (A) Negative   Ketones, UA Negative Negative   RBC, UA Negative Negative   Bilirubin, UA Negative Negative   Urobilinogen, Ur 0.2 0.2 - 1.0 mg/dL   Nitrite, UA Negative Negative   Microscopic Examination See below:   Microscopic Examination     Status: Abnormal   Collection Time: 11/01/19  2:58 PM   URINE  Result Value Ref Range   WBC, UA 0-5 0 - 5 /hpf   RBC None seen 0 -  2 /hpf   Epithelial Cells (non renal) 0-10 0 - 10 /hpf   Casts Present (A) None seen /lpf   Cast Type Hyaline casts N/A   Bacteria, UA None seen None seen/Few    -------------------------------------------------------------------------- A&P:  Problem List Items Addressed This Visit      Digestive   Viral gastroenteritis    Viral gastroenteritis beginning 12/15/2019.  Discussed many cases of viral gastroenteritis locally at present time.  Sent in Rx for Bentyl and Ondansetron to help manage symptoms and then to increase PO intake of fluids/foods slowly.  If unable to tolerate foods/fluids to proceed to the emergency room immediately.  Plan: 1. Follow up as needed.       Other Visit Diagnoses    Abdominal cramps    -  Primary   Relevant Medications   dicyclomine (BENTYL) 10 MG capsule   Nausea       Relevant Medications   ondansetron (ZOFRAN) 4 MG tablet      Meds ordered this encounter  Medications  . ondansetron (ZOFRAN) 4 MG tablet    Sig: Take 1 tablet (4 mg total) by mouth every 8 (eight) hours as needed for nausea or vomiting.    Dispense:  20 tablet    Refill:  0  . dicyclomine (BENTYL) 10 MG capsule    Sig: Take 1 capsule (10 mg  total) by mouth 4 (four) times daily -  before meals and at bedtime for 10 days.    Dispense:  40 capsule    Refill:  0    Follow-up: - Return in 1 month for physical  Patient verbalizes understanding with the above medical recommendations including the limitation of remote medical advice.  Specific follow-up and call-back criteria were given for patient to follow-up or seek medical care more urgently if needed.   - Time spent in direct consultation with patient on phone: 6 minutes  Harlin Rain, [facility] 12/18/2019, 2:41 PM

## 2019-12-26 ENCOUNTER — Other Ambulatory Visit: Payer: Self-pay

## 2019-12-26 ENCOUNTER — Encounter: Payer: Self-pay | Admitting: Pulmonary Disease

## 2019-12-26 ENCOUNTER — Ambulatory Visit (INDEPENDENT_AMBULATORY_CARE_PROVIDER_SITE_OTHER): Payer: BC Managed Care – PPO | Admitting: Pulmonary Disease

## 2019-12-26 DIAGNOSIS — Z7189 Other specified counseling: Secondary | ICD-10-CM

## 2019-12-26 DIAGNOSIS — G4733 Obstructive sleep apnea (adult) (pediatric): Secondary | ICD-10-CM

## 2019-12-26 NOTE — Patient Instructions (Signed)
Will arrange for auto CPAP set up  Follow up in 2 months in [place] office with Dr. Halford Chessman or Nurse Practitioner

## 2019-12-26 NOTE — Progress Notes (Signed)
[name]  Chief Complaint  Patient presents with  . Follow-up    F/U on HST results back in March 2021. Is not currently using a cpap machine. He has used one in the past but it broke. States it has been at least 6-8 months since he last used one.  Has had headaches since. Denied any new sleep issues.     Constitutional:  There were no vitals taken for this visit.   Deferred  Past Medical History:  DM, HTN, Allergies, BPH  Brief Summary:  Luke Coleman is a 69 y.o. male with obstructive   Subjective:  Virtual Visit via Telephone Note  I connected with Luke Coleman on 12/26/19 at  9:30 AM EDT by telephone and verified that I am speaking with the correct person using two identifiers.  Location: Patient: car Provider: medical office   I discussed the limitations, risks, security and privacy concerns of performing an evaluation and management service by telephone and the availability of in person appointments. I also discussed with the patient that there may be a patient responsible charge related to this service. The patient expressed understanding and agreed to proceed.  He had home sleep study in March.  Mild to moderate OSA.  Has more trouble sleeping.  Waking up with headaches.  Snoring more.  Feels tired during the day.  Physical Exam:   Deferred.  Assessment/Plan:   Obstructive sleep apnea. - reviewed sleep study results - discussed treatment options - discussed how sleep apnea can impact his health - driving precautions have been reviewed - will arrange for auto CPAP set up - discussed importance of weight loss   I discussed the assessment and treatment plan with the patient. The patient was provided an opportunity to ask questions and all were answered. The patient agreed with the plan and demonstrated an understanding of the instructions.   The patient was advised to call back or seek an in-person evaluation if the  symptoms worsen or if the condition fails to improve as anticipated.  I provided 15 minutes of non-face-to-face time during this encounter.   Follow up:   Patient Instructions  Will arrange for auto CPAP set up  Follow up in 2 months in [facility] office with Dr. Halford Chessman or Nurse Practitioner  Signature:  Chesley Mires, MD [place] Pager: [phone] 12/26/2019, 9:47 AM  Flow Sheet    Sleep tests:  HST 11/14/19 >> AHI 14, SpO2 low 79%  Medications:   Allergies as of 12/26/2019      Reactions   Sulfa Antibiotics Rash   Penile rash      Medication List       Accurate as of Dec 26, 2019  9:47 AM. If you have any questions, ask your nurse or doctor.        dicyclomine 10 MG capsule Commonly known as: Bentyl Take 1 capsule (10 mg total) by mouth 4 (four) times daily -  before meals and at bedtime for 10 days.   fluticasone 50 MCG/ACT nasal spray Commonly known as: FLONASE SHAKE LIQUID AND USE 2 SPRAYS IN EACH NOSTRIL DAILY   glucose blood test strip Commonly known as: ONE TOUCH ULTRA TEST USE TO TEST THREE TIMES DAILY   Lancets 28G Misc 3 (three) times daily. Use as instructed. DX: E11.9   lisinopril-hydrochlorothiazide 20-12.5 MG tablet Commonly known as: ZESTORETIC TAKE 1 TABLET BY MOUTH DAILY   mirabegron ER 50 MG Tb24 tablet Commonly known as: Myrbetriq  Take 1 tablet (50 mg total) by mouth daily.   ondansetron 4 MG tablet Commonly known as: Zofran Take 1 tablet (4 mg total) by mouth every 8 (eight) hours as needed for nausea or vomiting.   tadalafil 5 MG tablet Commonly known as: CIALIS Take 1 tablet (5 mg total) by mouth daily as needed for erectile dysfunction.   Trulicity 1.5 0000000 Sopn Generic drug: Dulaglutide Inject 1.5 mg into the skin every 7 (seven) days.       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  Family History:  His family history includes Alcohol abuse in his father; Cancer (age of onset: 59) in his father; Diabetes in his brother, maternal uncle, and paternal aunt; Early death in his mother; Heart disease (age of onset: 7) in his father; Hypertension in his father; Kidney disease in his maternal grandmother; Sleep apnea in his brother, brother, and daughter.  Social History:  He  reports that he has never smoked. He has never used smokeless tobacco. He reports current alcohol use. He reports that he does not use drugs.

## 2020-01-04 LAB — HM DIABETES FOOT EXAM: HM Diabetic Foot Exam: NORMAL

## 2020-01-04 LAB — HEMOGLOBIN A1C: Hemoglobin A1C: 7.1

## 2020-01-08 ENCOUNTER — Other Ambulatory Visit: Payer: Self-pay | Admitting: Urology

## 2020-01-16 ENCOUNTER — Other Ambulatory Visit: Payer: Self-pay | Admitting: Family Medicine

## 2020-01-16 MED ORDER — TADALAFIL 5 MG PO TABS: 5.0000 mg | ORAL_TABLET | Freq: Every day | ORAL | 3 refills | Status: DC | PRN

## 2020-01-17 ENCOUNTER — Other Ambulatory Visit: Payer: Self-pay | Admitting: Family Medicine

## 2020-01-17 DIAGNOSIS — J302 Other seasonal allergic rhinitis: Secondary | ICD-10-CM

## 2020-01-17 NOTE — Telephone Encounter (Signed)
Requested Prescriptions  Pending Prescriptions Disp Refills  . fluticasone (FLONASE) 50 MCG/ACT nasal spray [Pharmacy Med Name: FLUTICASONE 50MCG NASAL SP (120) RX] 16 g 0    Sig: SHAKE LIQUID AND USE 2 SPRAYS IN EACH NOSTRIL DAILY     Ear, Nose, and Throat: Nasal Preparations - Corticosteroids Passed - 01/17/2020  3:27 AM      Passed - Valid encounter within last 12 months    Recent Outpatient Visits          1 month ago Abdominal cramps   [facility], Lupita Raider, FNP   8 months ago Preoperative evaluation to rule out surgical contraindication   [facility] [name], NP   1 year ago Mild intermittent asthma with acute exacerbation   [place], DO   1 year ago Penile rash   [facility] Merrilyn Puma, Jerrel Ivory, NP   1 year ago Cellulitis of toe of right foot   [facility] [name], NP              Pt. Seen in 11/2019 and advised to return in one mo. For physical; courtesy refill given; 16 g. and no refills.

## 2020-01-19 NOTE — Telephone Encounter (Signed)
I called the company it was sent to they asked for me to call back after 12:30 to check on it

## 2020-01-23 ENCOUNTER — Other Ambulatory Visit: Payer: Self-pay | Admitting: Urology

## 2020-01-23 NOTE — Telephone Encounter (Signed)
Does patient need to continue Finasteride?

## 2020-01-24 NOTE — Telephone Encounter (Signed)
I have called the company again she is going to check on it and call back

## 2020-01-29 DIAGNOSIS — G4733 Obstructive sleep apnea (adult) (pediatric): Secondary | ICD-10-CM

## 2020-01-31 NOTE — Telephone Encounter (Signed)
Lauren has contacted the patient in another message I will close this one.

## 2020-02-05 ENCOUNTER — Encounter: Payer: Self-pay | Admitting: Urology

## 2020-02-05 NOTE — Telephone Encounter (Signed)
PCCs, is there any way you can look into this for [name] please.

## 2020-02-06 NOTE — Telephone Encounter (Signed)
I have sent order to [employer] per pt's request.  Will route message to triage so pt can be made aware thru [place].

## 2020-02-08 NOTE — Telephone Encounter (Signed)
Called patient, needs to schedule appointment to discuss further treatment options.

## 2020-02-14 ENCOUNTER — Other Ambulatory Visit: Payer: Self-pay | Admitting: Family Medicine

## 2020-02-14 DIAGNOSIS — I10 Essential (primary) hypertension: Secondary | ICD-10-CM

## 2020-02-14 LAB — HM DIABETES EYE EXAM

## 2020-02-14 NOTE — Telephone Encounter (Signed)
Requested medications are due for refill today?  Yes  Requested medications are on active medication list?  Yes  Last Refill:   08/21/2019  # 90 with one refill   Future visit scheduled?  No   Notes to Clinic:  Medication failed RX refill protocol due to no labs within past 180 days.  Last labs were performed on 05/25/2019.

## 2020-02-15 NOTE — Telephone Encounter (Signed)
Appointment scheduled.

## 2020-03-11 NOTE — H&P (View-Only) (Signed)
03/12/2020 3:32 PM   Luke Coleman [birth year] [id number]  Referring provider: Verl Bangs, [place],  [place] [address] Chief Complaint  Patient presents with   Other    HPI: Luke Coleman is a 70 y.o. 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  He experienced urge incontinence and stress incontinence after HoLEP.   He had a cysto in 10/2019 revealing a widely patent prostatic fossa and coaptation of sphincter. Normal bladder also noted.   He did work with physical therapy for a few sessions but this is no longer covered by his insurance.  He is doing exercises independently.  Overall, he feels like he is improving.  He started 2 diapers today and was previous wearing 5.  He now only leaks when he lifts something extremely heavy.  He is primarily bothered by urinating large volumes when he feels relaxed.  He may just be sitting, feels sudden urge and then urinate.  He has not noticed a difference with Myrbetriq 50 mg. He feels like he has alternating good and bad days.   PMH: Past Medical History:  Diagnosis Date   Acute bacterial sinusitis 08/22/2014   COPD (chronic obstructive pulmonary disease) ([facility])    Diabetes mellitus without complication ([place])    Enlarged prostate    Essential (primary) hypertension 03/01/2013   Overview:  Last Assessment & Plan:  Taking meds as prescribed. Check bmet. Continue to follow.    HTN (hypertension) 03/01/2013   Hypertension    Sleep apnea    on CPAP, USUALLY...CURRENTLY BROKEN    Surgical History: Past Surgical History:  Procedure Laterality Date   APPENDECTOMY     CATARACT EXTRACTION W/PHACO Left 10/26/2018   Procedure: CATARACT EXTRACTION PHACO AND INTRAOCULAR LENS PLACEMENT (IOC) LEFT;  Surgeon: Eulogio Bear, MD;   Location: [facility] ORS;  Service: Ophthalmology;  Laterality: Left;  [name] 00:30.7 CDE 20.7 Fluid pack Lot # [id number]   CIRCUMCISION  Age 25   COLON SURGERY     EYE SURGERY Right 1971   correct lazy eye   HERNIA REPAIR     HOLEP-LASER ENUCLEATION OF THE PROSTATE WITH MORCELLATION N/A 05/29/2019   Procedure: HOLEP-LASER ENUCLEATION OF THE PROSTATE WITH MORCELLATION;  Surgeon: [name], MD;  Location: [facility] ORS;  Service: Urology;  Laterality: N/A;   NASAL SEPTUM SURGERY     For deviated septum   SMALL INTESTINE SURGERY     Adhesions    Home Medications:  Allergies as of 03/12/2020      Reactions   Sulfa Antibiotics Rash   Penile rash      Medication List       Accurate as of March 12, 2020  3:32 PM. If you have any questions, ask your nurse or doctor.        albuterol (2.5 MG/3ML) 0.083% nebulizer solution Commonly known as: PROVENTIL Use 1 via via nebulizer every 6 hours as needed for wheezing or shortness of breath   dicyclomine 10 MG capsule Commonly known as: Bentyl Take 1 capsule (10 mg total) by mouth 4 (four) times daily -  before meals and at bedtime for 10 days.   fluticasone 50 MCG/ACT nasal spray Commonly known as: FLONASE SHAKE LIQUID AND USE 2 SPRAYS IN EACH NOSTRIL DAILY   glucose blood test strip Commonly known as: ONE TOUCH ULTRA TEST USE  TO TEST THREE TIMES DAILY   Lancets 28G Misc 3 (three) times daily. Use as instructed. DX: E11.9   lisinopril-hydrochlorothiazide 20-12.5 MG tablet Commonly known as: ZESTORETIC TAKE 1 TABLET BY MOUTH DAILY   mirabegron ER 50 MG Tb24 tablet Commonly known as: Myrbetriq Take 1 tablet (50 mg total) by mouth daily.   ondansetron 4 MG tablet Commonly known as: Zofran Take 1 tablet (4 mg total) by mouth every 8 (eight) hours as needed for nausea or vomiting.   tadalafil 5 MG tablet Commonly known as: CIALIS Take 1 tablet (5 mg total) by mouth daily as needed for erectile dysfunction.   Trulicity 1.5  UQ/3.3HL Sopn Generic drug: Dulaglutide Inject 1.5 mg into the skin every 7 (seven) days.   Trulicity 4.5 KT/6.2BW Sopn Generic drug: Dulaglutide INJECT 4.5 MG SUBCUTANEOUSLY ONCE A WEEK       Allergies:  Allergies  Allergen Reactions   Sulfa Antibiotics Rash    Penile rash    Family History: Family History  Problem Relation Age of Onset   Early death Mother        MVA - died age 57   Alcohol abuse Father    Hypertension Father    Cancer Father 18       renal cell carcinoma   Heart disease Father 52       MI - died   Diabetes Brother    Sleep apnea Brother    Sleep apnea Daughter    Sleep apnea Brother    Diabetes Maternal Uncle    Diabetes Paternal Aunt    Kidney disease Maternal Grandmother     Social History:  reports that he has never smoked. He has never used smokeless tobacco. He reports current alcohol use. He reports that he does not use drugs.   Physical Exam: BP (!) 161/95    Pulse (!) 110    Ht 5\' 7"  (1.702 m)    Wt 214 lb 3.2 oz (97.2 kg)    BMI 33.55 kg/m   Constitutional:  Alert and oriented, No acute distress. HEENT: [place] AT, moist mucus membranes.  Trachea midline, no masses. Cardiovascular: No clubbing, cyanosis, or edema. Respiratory: Normal respiratory effort, no increased work of breathing. Skin: No rashes, bruises or suspicious lesions. Neurologic: Grossly intact, no focal deficits, moving all 4 extremities. Psychiatric: Normal mood and affect.    Assessment & Plan:    1. Urinary with urge incontinence  Severe refractory symptoms-failed anticholinergics and Myrbetriq  Alternatives including PTNS and Botox had previously been discussed and were discussed at further length in detail today.  Discussed risk and benefits of botox for severe urge incontinence due to significant symptoms.  Risk including urinary retention (approximately 5%), infection, failure of the medication to achieve the effect, hematuria mocks others were all  discussed.  Patient agreed to botox and was provided with a brochure.   2. Stress incontinence Significant symptomatic improvement.  Continue pelvic floor exercise  3. Nocturia  Improving with CPAP for OSA   [facility] [address] [place], [place] [address] [phone]  I, Selena Batten, am acting as a scribe for Dr. [name].  I have reviewed the above documentation for accuracy and completeness, and I agree with the above.   [name], MD

## 2020-03-11 NOTE — Progress Notes (Signed)
03/12/2020 3:32 PM   [name] 04-24-1951 [id number]  Referring provider: Verl Bangs, [place],  [place] [address] Chief Complaint  Patient presents with   Other    HPI: Luke Coleman is a 69 y.o. 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  He experienced urge incontinence and stress incontinence after HoLEP.   He had a cysto in 10/2019 revealing a widely patent prostatic fossa and coaptation of sphincter. Normal bladder also noted.   He did work with physical therapy for a few sessions but this is no longer covered by his insurance.  He is doing exercises independently.  Overall, he feels like he is improving.  He started 2 diapers today and was previous wearing 5.  He now only leaks when he lifts something extremely heavy.  He is primarily bothered by urinating large volumes when he feels relaxed.  He may just be sitting, feels sudden urge and then urinate.  He has not noticed a difference with Myrbetriq 50 mg. He feels like he has alternating good and bad days.   PMH: Past Medical History:  Diagnosis Date   Acute bacterial sinusitis 08/22/2014   COPD (chronic obstructive pulmonary disease) (HCC)    Diabetes mellitus without complication ([place])    Enlarged prostate    Essential (primary) hypertension 03/01/2013   Overview:  Last Assessment & Plan:  Taking meds as prescribed. Check bmet. Continue to follow.    HTN (hypertension) 03/01/2013   Hypertension    Sleep apnea    on CPAP, USUALLY...CURRENTLY BROKEN    Surgical History: Past Surgical History:  Procedure Laterality Date   APPENDECTOMY     CATARACT EXTRACTION W/PHACO Left 10/26/2018   Procedure: CATARACT EXTRACTION PHACO AND INTRAOCULAR LENS PLACEMENT (IOC) LEFT;  Surgeon: Eulogio Bear, MD;   Location: [facility] ORS;  Service: Ophthalmology;  Laterality: Left;  [name] 00:30.7 CDE 20.7 Fluid pack Lot # [id number]   CIRCUMCISION  Age 12   COLON SURGERY     EYE SURGERY Right 1971   correct lazy eye   HERNIA REPAIR     HOLEP-LASER ENUCLEATION OF THE PROSTATE WITH MORCELLATION N/A 05/29/2019   Procedure: HOLEP-LASER ENUCLEATION OF THE PROSTATE WITH MORCELLATION;  Surgeon: [name], MD;  Location: [facility] ORS;  Service: Urology;  Laterality: N/A;   NASAL SEPTUM SURGERY     For deviated septum   SMALL INTESTINE SURGERY     Adhesions    Home Medications:  Allergies as of 03/12/2020      Reactions   Sulfa Antibiotics Rash   Penile rash      Medication List       Accurate as of March 12, 2020  3:32 PM. If you have any questions, ask your nurse or doctor.        albuterol (2.5 MG/3ML) 0.083% nebulizer solution Commonly known as: PROVENTIL Use 1 via via nebulizer every 6 hours as needed for wheezing or shortness of breath   dicyclomine 10 MG capsule Commonly known as: Bentyl Take 1 capsule (10 mg total) by mouth 4 (four) times daily -  before meals and at bedtime for 10 days.   fluticasone 50 MCG/ACT nasal spray Commonly known as: FLONASE SHAKE LIQUID AND USE 2 SPRAYS IN EACH NOSTRIL DAILY   glucose blood test strip Commonly known as: ONE TOUCH ULTRA TEST USE  TO TEST THREE TIMES DAILY   Lancets 28G Misc 3 (three) times daily. Use as instructed. DX: E11.9   lisinopril-hydrochlorothiazide 20-12.5 MG tablet Commonly known as: ZESTORETIC TAKE 1 TABLET BY MOUTH DAILY   mirabegron ER 50 MG Tb24 tablet Commonly known as: Myrbetriq Take 1 tablet (50 mg total) by mouth daily.   ondansetron 4 MG tablet Commonly known as: Zofran Take 1 tablet (4 mg total) by mouth every 8 (eight) hours as needed for nausea or vomiting.   tadalafil 5 MG tablet Commonly known as: CIALIS Take 1 tablet (5 mg total) by mouth daily as needed for erectile dysfunction.   Trulicity 1.5  HQ/7.5FF Sopn Generic drug: Dulaglutide Inject 1.5 mg into the skin every 7 (seven) days.   Trulicity 4.5 MB/8.4YK Sopn Generic drug: Dulaglutide INJECT 4.5 MG SUBCUTANEOUSLY ONCE A WEEK       Allergies:  Allergies  Allergen Reactions   Sulfa Antibiotics Rash    Penile rash    Family History: Family History  Problem Relation Age of Onset   Early death Mother        MVA - died age 31   Alcohol abuse Father    Hypertension Father    Cancer Father 74       renal cell carcinoma   Heart disease Father 40       MI - died   Diabetes Brother    Sleep apnea Brother    Sleep apnea Daughter    Sleep apnea Brother    Diabetes Maternal Uncle    Diabetes Paternal Aunt    Kidney disease Maternal Grandmother     Social History:  reports that he has never smoked. He has never used smokeless tobacco. He reports current alcohol use. He reports that he does not use drugs.   Physical Exam: BP (!) 161/95    Pulse (!) 110    Ht 5\' 7"  (1.702 m)    Wt 214 lb 3.2 oz (97.2 kg)    BMI 33.55 kg/m   Constitutional:  Alert and oriented, No acute distress. HEENT: [place] AT, moist mucus membranes.  Trachea midline, no masses. Cardiovascular: No clubbing, cyanosis, or edema. Respiratory: Normal respiratory effort, no increased work of breathing. Skin: No rashes, bruises or suspicious lesions. Neurologic: Grossly intact, no focal deficits, moving all 4 extremities. Psychiatric: Normal mood and affect.    Assessment & Plan:    1. Urinary with urge incontinence  Severe refractory symptoms-failed anticholinergics and Myrbetriq  Alternatives including PTNS and Botox had previously been discussed and were discussed at further length in detail today.  Discussed risk and benefits of botox for severe urge incontinence due to significant symptoms.  Risk including urinary retention (approximately 5%), infection, failure of the medication to achieve the effect, hematuria mocks others were all  discussed.  Patient agreed to botox and was provided with a brochure.   2. Stress incontinence Significant symptomatic improvement.  Continue pelvic floor exercise  3. Nocturia  Improving with CPAP for OSA   [facility] [address] [place], [place] [address] [phone]  I, Selena Batten, am acting as a scribe for Dr. [name].  I have reviewed the above documentation for accuracy and completeness, and I agree with the above.   [name], MD

## 2020-03-12 ENCOUNTER — Ambulatory Visit: Payer: BC Managed Care – PPO | Admitting: Urology

## 2020-03-12 ENCOUNTER — Other Ambulatory Visit: Payer: Self-pay | Admitting: Radiology

## 2020-03-12 ENCOUNTER — Other Ambulatory Visit: Payer: Self-pay

## 2020-03-12 ENCOUNTER — Encounter: Payer: Self-pay | Admitting: Urology

## 2020-03-12 VITALS — BP 161/95 | HR 110 | Ht 67.0 in | Wt 214.2 lb

## 2020-03-12 DIAGNOSIS — R3915 Urgency of urination: Secondary | ICD-10-CM

## 2020-03-12 DIAGNOSIS — N3946 Mixed incontinence: Secondary | ICD-10-CM

## 2020-03-12 DIAGNOSIS — N138 Other obstructive and reflux uropathy: Secondary | ICD-10-CM

## 2020-03-12 DIAGNOSIS — N401 Enlarged prostate with lower urinary tract symptoms: Secondary | ICD-10-CM | POA: Diagnosis not present

## 2020-03-12 LAB — URINALYSIS, COMPLETE
Bilirubin, UA: NEGATIVE
Leukocytes,UA: NEGATIVE
Nitrite, UA: NEGATIVE
Protein,UA: NEGATIVE
RBC, UA: NEGATIVE
Specific Gravity, UA: 1.02 (ref 1.005–1.030)
Urobilinogen, Ur: 0.2 mg/dL (ref 0.2–1.0)
pH, UA: 5 (ref 5.0–7.5)

## 2020-03-12 LAB — MICROSCOPIC EXAMINATION
Bacteria, UA: NONE SEEN
Epithelial Cells (non renal): NONE SEEN /hpf (ref 0–10)

## 2020-03-12 MED ORDER — ONABOTULINUMTOXINA 100 UNITS IJ SOLR: 100.0000 [IU] | INTRAMUSCULAR | Status: AC

## 2020-03-13 ENCOUNTER — Telehealth: Payer: Self-pay | Admitting: Family Medicine

## 2020-03-13 DIAGNOSIS — I10 Essential (primary) hypertension: Secondary | ICD-10-CM

## 2020-03-13 NOTE — Telephone Encounter (Signed)
Requested medications are due for refill today?  Yes  Requested medications are on active medication list?  Yes  Last Refill:  02/14/2020    30 with no refills with notation that patient needs an office visit for additional refills.   Future visit scheduled?  No   Notes to Clinic:  Medication failed RX refill protocol due to no labs within the past 180 days.  Last labs were performed on 05/25/2019.

## 2020-03-14 ENCOUNTER — Other Ambulatory Visit: Payer: Self-pay

## 2020-03-14 DIAGNOSIS — I10 Essential (primary) hypertension: Secondary | ICD-10-CM

## 2020-03-17 LAB — CULTURE, URINE COMPREHENSIVE

## 2020-03-20 ENCOUNTER — Other Ambulatory Visit: Payer: Self-pay

## 2020-03-20 ENCOUNTER — Encounter
Admission: RE | Admit: 2020-03-20 | Discharge: 2020-03-20 | Disposition: A | Discharge status: Home / Self Care | Payer: BC Managed Care – PPO | Source: Ambulatory Visit | Attending: Urology | Admitting: Urology

## 2020-03-20 NOTE — Patient Instructions (Signed)
Your procedure is scheduled on: Monday 03/25/20.  Report to DAY SURGERY DEPARTMENT LOCATED ON 2ND FLOOR MEDICAL MALL ENTRANCE. To find out your arrival time please call [phone] between 1PM - 3PM on Friday 03/22/20.   Remember: Instructions that are not followed completely may result in serious medical risk, up to and including death, or upon the discretion of your surgeon and anesthesiologist your surgery may need to be rescheduled.     __X__ 1. Do not eat food after midnight the night before your procedure.                 No gum chewing or hard candies. You may drink clear liquids up to 2 hours                 before you are scheduled to arrive for your surgery- DO NOT drink clear                 liquids within 2 hours of the start of your surgery.                 Clear Liquids include:  water, apple juice without pulp, clear carbohydrate                 drink such as Clearfast or Gatorade, Black Coffee or Tea (Do not add                 milk or creamer to coffee or tea).    __X__2.  On the morning of surgery brush your teeth with toothpaste and water, you may rinse your mouth with mouthwash if you wish.  Do not swallow any toothpaste or mouthwash.      __X__ 3.  No Alcohol for 24 hours before or after surgery.   __X__ 4.  Do Not Smoke or use e-cigarettes For 24 Hours Prior to Your Surgery.                 Do not use any chewable tobacco products for at least 6 hours prior to                 Surgery.   __X__5.  Notify your doctor if there is any change in your medical condition      (cold, fever, infections).      Do NOT wear jewelry, make-up, hairpins, clips or nail polish. Do NOT wear lotions, powders, or perfumes.  Do NOT shave 48 hours prior to surgery. Men may shave face and neck. Do NOT bring valuables to the hospital.     [facility] is not responsible for any belongings or valuables.   Contacts, dentures/partials or body piercings may not be worn into  surgery. Bring a case for your contacts, glasses or hearing aids, a denture cup will be supplied.    Patients discharged the day of surgery will not be allowed to drive home.     __X__ Take these medicines the morning of surgery with A SIP OF WATER:     1. albuterol (PROVENTIL) (2.5 MG/3ML) 0.083% nebulizer solution  2. fluticasone (FLONASE) if needed    __X__ Stop Anti-inflammatories 7 days before surgery such as Advil, Ibuprofen, Motrin, BC or Goodies Powder, Naprosyn, Naproxen, Aleve, Aspirin, Meloxicam. May take Tylenol if needed for pain or discomfort.   __X__Do not start taking any new herbal supplements or vitamins prior to your procedure.     Wear comfortable clothing (specific to your surgery type) to the hospital.  Plan for stool softeners for home use; pain medications have a tendency to cause constipation. You can also help prevent constipation by eating foods high in fiber such as fruits and vegetables and drinking plenty of fluids as your diet allows.  After surgery, you can prevent lung complications by doing breathing exercises.Take deep breaths and cough every 1-2 hours. Your doctor may order a device called an Incentive Spirometer to help you take deep breaths.  Please call the [place] Department at [phone] if you have any questions about these instructions

## 2020-03-21 ENCOUNTER — Encounter
Admission: RE | Admit: 2020-03-21 | Discharge: 2020-03-21 | Disposition: A | Discharge status: Home / Self Care | Payer: BC Managed Care – PPO | Source: Ambulatory Visit | Attending: Urology | Admitting: Urology

## 2020-03-21 DIAGNOSIS — Z01818 Encounter for other preprocedural examination: Secondary | ICD-10-CM | POA: Diagnosis present

## 2020-03-21 DIAGNOSIS — Z20822 Contact with and (suspected) exposure to covid-19: Secondary | ICD-10-CM | POA: Insufficient documentation

## 2020-03-21 LAB — BASIC METABOLIC PANEL
Anion gap: 12 (ref 5–15)
BUN: 28 mg/dL — ABNORMAL HIGH (ref 8–23)
CO2: 22 mmol/L (ref 22–32)
Calcium: 9 mg/dL (ref 8.9–10.3)
Chloride: 101 mmol/L (ref 98–111)
Creatinine, Ser: 1.66 mg/dL — ABNORMAL HIGH (ref 0.61–1.24)
GFR calc Af Amer: 48 mL/min — ABNORMAL LOW (ref >60)
GFR calc non Af Amer: 42 mL/min — ABNORMAL LOW (ref >60)
Glucose, Bld: 263 mg/dL — ABNORMAL HIGH (ref 70–99)
Potassium: 3.8 mmol/L (ref 3.5–5.1)
Sodium: 135 mmol/L (ref 135–145)

## 2020-03-21 LAB — CBC
HCT: 43.5 % (ref 39.0–52.0)
Hemoglobin: 16.1 g/dL (ref 13.0–17.0)
MCH: 33.1 pg (ref 26.0–34.0)
MCHC: 37 g/dL — ABNORMAL HIGH (ref 30.0–36.0)
MCV: 89.5 fL (ref 80.0–100.0)
Platelets: 180 10*3/uL (ref 150–400)
RBC: 4.86 MIL/uL (ref 4.22–5.81)
RDW: 12.9 % (ref 11.5–15.5)
WBC: 7.1 10*3/uL (ref 4.0–10.5)
nRBC: 0 % (ref 0.0–0.2)

## 2020-03-21 LAB — SARS CORONAVIRUS 2 (TAT 6-24 HRS): SARS Coronavirus 2: NEGATIVE

## 2020-03-23 NOTE — Anesthesia Preprocedure Evaluation (Addendum)
Anesthesia Evaluation  Patient identified by MRN, date of birth, ID band Patient awake    Reviewed: Allergy & Precautions, H&P , NPO status , Patient's Chart, lab work & pertinent test results  History of Anesthesia Complications (+) history of anesthetic complications (Pt extremely anxious about anesthesia.  States he had a "bad experience when going to sleep" but unable to further clarify)  Airway Mallampati: II  TM Distance: >3 FB     Dental  (+) Teeth Intact   Pulmonary sleep apnea and Continuous Positive Airway Pressure Ventilation , COPD,    breath sounds clear to auscultation       Cardiovascular hypertension, (-) angina(-) Past MI and (-) Cardiac Stents (-) dysrhythmias  Rhythm:regular Rate:Normal     Neuro/Psych PSYCHIATRIC DISORDERS Anxiety negative neurological ROS     GI/Hepatic negative GI ROS, Neg liver ROS,   Endo/Other  diabetes  Renal/GU      Musculoskeletal   Abdominal   Peds  Hematology negative hematology ROS (+)   Anesthesia Other Findings Obesity  Past Medical History: 08/22/2014: Acute bacterial sinusitis No date: COPD (chronic obstructive pulmonary disease) (HCC) No date: Diabetes mellitus without complication ([place]) No date: Enlarged prostate 03/01/2013: Essential (primary) hypertension     Comment:  Overview:  Last Assessment & Plan:  Taking meds as               prescribed. Check bmet. Continue to follow.  03/01/2013: HTN (hypertension) No date: Hypertension No date: Sleep apnea     Comment:  on CPAP, USUALLY...CURRENTLY BROKEN  Past Surgical History: No date: APPENDECTOMY 10/26/2018: CATARACT EXTRACTION W/PHACO; Left     Comment:  Procedure: CATARACT EXTRACTION PHACO AND INTRAOCULAR               LENS PLACEMENT ([place]) LEFT;  Surgeon: Eulogio Bear,               MD;  Location: [facility] ORS;  Service: Ophthalmology;                Laterality: Left;  [name] 00:30.7 CDE 20.7 Fluid pack Lot  #              [id number] H Age 69: CIRCUMCISION No date: Circle: EYE SURGERY; Right     Comment:  correct lazy eye No date: HERNIA REPAIR 05/29/2019: HOLEP-LASER ENUCLEATION OF THE PROSTATE WITH MORCELLATION;  N/A     Comment:  Procedure: HOLEP-LASER ENUCLEATION OF THE PROSTATE WITH               MORCELLATION;  Surgeon: [name], MD;  Location:               [facility] ORS;  Service: Urology;  Laterality: N/A; No date: NASAL SEPTUM SURGERY     Comment:  For deviated septum No date: SMALL INTESTINE SURGERY     Comment:  Adhesions     Reproductive/Obstetrics negative OB ROS                           Anesthesia Physical Anesthesia Plan  ASA: III  Anesthesia Plan: General LMA   Post-op Pain Management:    Induction:   PONV Risk Score and Plan: Dexamethasone, Ondansetron, Treatment may vary due to age or medical condition and Midazolam  Airway Management Planned:   Additional Equipment:   Intra-op Plan:   Post-operative Plan:   Informed Consent: I have reviewed the patients History and Physical, chart, labs and discussed  the procedure including the risks, benefits and alternatives for the proposed anesthesia with the patient or authorized representative who has indicated his/her understanding and acceptance.     Dental Advisory Given  Plan Discussed with: Anesthesiologist, CRNA and Surgeon  Anesthesia Plan Comments: (Pt very anxious, became slightly tearful due to fear of anesthesia, stated he preferred not to discuss the details of the anesthesia plan.  Reassurance provided and midazolam ordered in pre-op.  KR)       Anesthesia Quick Evaluation

## 2020-03-25 ENCOUNTER — Encounter: Payer: Self-pay | Admitting: Urology

## 2020-03-25 ENCOUNTER — Encounter
Admission: RE | Disposition: A | Discharge status: Home / Self Care | Payer: Self-pay | Source: Home / Self Care | Attending: Urology

## 2020-03-25 ENCOUNTER — Ambulatory Visit
Admission: RE | Admit: 2020-03-25 | Discharge: 2020-03-25 | Disposition: A | Discharge status: Home / Self Care | Payer: BC Managed Care – PPO | Attending: Urology | Admitting: Urology

## 2020-03-25 ENCOUNTER — Ambulatory Visit: Payer: BC Managed Care – PPO | Admitting: Anesthesiology

## 2020-03-25 ENCOUNTER — Other Ambulatory Visit: Payer: Self-pay

## 2020-03-25 DIAGNOSIS — I10 Essential (primary) hypertension: Secondary | ICD-10-CM | POA: Diagnosis not present

## 2020-03-25 DIAGNOSIS — N3946 Mixed incontinence: Secondary | ICD-10-CM | POA: Diagnosis not present

## 2020-03-25 DIAGNOSIS — Z6831 Body mass index (BMI) 31.0-31.9, adult: Secondary | ICD-10-CM | POA: Diagnosis not present

## 2020-03-25 DIAGNOSIS — Z79899 Other long term (current) drug therapy: Secondary | ICD-10-CM | POA: Insufficient documentation

## 2020-03-25 DIAGNOSIS — Z882 Allergy status to sulfonamides status: Secondary | ICD-10-CM | POA: Insufficient documentation

## 2020-03-25 DIAGNOSIS — N401 Enlarged prostate with lower urinary tract symptoms: Secondary | ICD-10-CM | POA: Diagnosis not present

## 2020-03-25 DIAGNOSIS — N3281 Overactive bladder: Secondary | ICD-10-CM | POA: Insufficient documentation

## 2020-03-25 DIAGNOSIS — J449 Chronic obstructive pulmonary disease, unspecified: Secondary | ICD-10-CM | POA: Insufficient documentation

## 2020-03-25 DIAGNOSIS — N3289 Other specified disorders of bladder: Secondary | ICD-10-CM

## 2020-03-25 DIAGNOSIS — E119 Type 2 diabetes mellitus without complications: Secondary | ICD-10-CM | POA: Diagnosis not present

## 2020-03-25 DIAGNOSIS — G4730 Sleep apnea, unspecified: Secondary | ICD-10-CM | POA: Insufficient documentation

## 2020-03-25 DIAGNOSIS — E669 Obesity, unspecified: Secondary | ICD-10-CM | POA: Diagnosis not present

## 2020-03-25 DIAGNOSIS — R3915 Urgency of urination: Secondary | ICD-10-CM

## 2020-03-25 HISTORY — PX: BOTOX INJECTION: SHX5754

## 2020-03-25 HISTORY — PX: BIOPSY: SHX5522

## 2020-03-25 LAB — GLUCOSE, CAPILLARY
Glucose-Capillary: 210 mg/dL — ABNORMAL HIGH (ref 70–99)
Glucose-Capillary: 176 mg/dL — ABNORMAL HIGH (ref 70–99)

## 2020-03-25 SURGERY — BOTOX INJECTION
Anesthesia: General

## 2020-03-25 MED ORDER — DEXAMETHASONE SODIUM PHOSPHATE 10 MG/ML IJ SOLN
INTRAMUSCULAR | Status: DC | PRN
  Administered 2020-03-25: 5 mg via INTRAVENOUS

## 2020-03-25 MED ORDER — DEXMEDETOMIDINE HCL IN NACL 80 MCG/20ML IV SOLN
INTRAVENOUS | Status: AC
  Filled 2020-03-25: qty 20

## 2020-03-25 MED ORDER — ORAL CARE MOUTH RINSE: 15.0000 mL | Freq: Once | OROMUCOSAL | Status: AC

## 2020-03-25 MED ORDER — MIDAZOLAM HCL 2 MG/2ML IJ SOLN
INTRAMUSCULAR | Status: AC
  Filled 2020-03-25: qty 2

## 2020-03-25 MED ORDER — CEFAZOLIN SODIUM-DEXTROSE 2-4 GM/100ML-% IV SOLN
INTRAVENOUS | Status: AC
  Filled 2020-03-25: qty 100

## 2020-03-25 MED ORDER — SODIUM CHLORIDE 0.9 % IV SOLN: INTRAVENOUS | Status: DC

## 2020-03-25 MED ORDER — MIDAZOLAM HCL 2 MG/2ML IJ SOLN
INTRAMUSCULAR | Status: AC
  Administered 2020-03-25: 1 mg via INTRAVENOUS
  Filled 2020-03-25: qty 2

## 2020-03-25 MED ORDER — SODIUM CHLORIDE (PF) 0.9 % IJ SOLN
INTRAMUSCULAR | Status: DC | PRN
  Administered 2020-03-25: 10 mL

## 2020-03-25 MED ORDER — FAMOTIDINE 20 MG PO TABS
ORAL_TABLET | ORAL | Status: AC
  Administered 2020-03-25: 20 mg via ORAL
  Filled 2020-03-25: qty 1

## 2020-03-25 MED ORDER — PHENYLEPHRINE HCL (PRESSORS) 10 MG/ML IV SOLN
INTRAVENOUS | Status: DC | PRN
  Administered 2020-03-25 (×3): 100 ug via INTRAVENOUS
  Administered 2020-03-25: 50 ug via INTRAVENOUS

## 2020-03-25 MED ORDER — SODIUM CHLORIDE (PF) 0.9 % IJ SOLN
INTRAMUSCULAR | Status: AC
  Filled 2020-03-25: qty 20

## 2020-03-25 MED ORDER — CEFAZOLIN SODIUM-DEXTROSE 2-4 GM/100ML-% IV SOLN
2.0000 g | INTRAVENOUS | Status: AC
  Administered 2020-03-25: 2 g via INTRAVENOUS

## 2020-03-25 MED ORDER — PROPOFOL 10 MG/ML IV BOLUS
INTRAVENOUS | Status: AC
  Filled 2020-03-25: qty 80

## 2020-03-25 MED ORDER — FENTANYL CITRATE (PF) 100 MCG/2ML IJ SOLN
INTRAMUSCULAR | Status: AC
  Filled 2020-03-25: qty 2

## 2020-03-25 MED ORDER — FENTANYL CITRATE (PF) 100 MCG/2ML IJ SOLN: 25.0000 ug | INTRAMUSCULAR | Status: DC | PRN

## 2020-03-25 MED ORDER — ONABOTULINUMTOXINA 100 UNITS IJ SOLR
INTRAMUSCULAR | Status: DC | PRN
  Administered 2020-03-25: 100 [IU] via INTRAMUSCULAR

## 2020-03-25 MED ORDER — CHLORHEXIDINE GLUCONATE 0.12 % MT SOLN: 15.0000 mL | Freq: Once | OROMUCOSAL | Status: AC

## 2020-03-25 MED ORDER — DEXMEDETOMIDINE HCL IN NACL 200 MCG/50ML IV SOLN
INTRAVENOUS | Status: DC | PRN
  Administered 2020-03-25: 8 ug via INTRAVENOUS

## 2020-03-25 MED ORDER — LIDOCAINE HCL (CARDIAC) PF 100 MG/5ML IV SOSY
PREFILLED_SYRINGE | INTRAVENOUS | Status: DC | PRN
  Administered 2020-03-25: 100 mg via INTRAVENOUS

## 2020-03-25 MED ORDER — FAMOTIDINE 20 MG PO TABS: 20.0000 mg | ORAL_TABLET | Freq: Once | ORAL | Status: AC

## 2020-03-25 MED ORDER — MIDAZOLAM HCL 2 MG/2ML IJ SOLN: 1.0000 mg | Freq: Once | INTRAMUSCULAR | Status: AC

## 2020-03-25 MED ORDER — LACTATED RINGERS IV SOLN: INTRAVENOUS | Status: DC | PRN

## 2020-03-25 MED ORDER — CHLORHEXIDINE GLUCONATE 0.12 % MT SOLN
OROMUCOSAL | Status: AC
  Administered 2020-03-25: 15 mL via OROMUCOSAL
  Filled 2020-03-25: qty 15

## 2020-03-25 MED ORDER — PROPOFOL 10 MG/ML IV BOLUS
INTRAVENOUS | Status: DC | PRN
  Administered 2020-03-25: 140 mg via INTRAVENOUS

## 2020-03-25 MED ORDER — ONDANSETRON HCL 4 MG/2ML IJ SOLN: 4.0000 mg | Freq: Once | INTRAMUSCULAR | Status: DC | PRN

## 2020-03-25 MED ORDER — FENTANYL CITRATE (PF) 100 MCG/2ML IJ SOLN
INTRAMUSCULAR | Status: DC | PRN
  Administered 2020-03-25: 100 ug via INTRAVENOUS

## 2020-03-25 MED ORDER — ONDANSETRON HCL 4 MG/2ML IJ SOLN
INTRAMUSCULAR | Status: DC | PRN
  Administered 2020-03-25: 4 mg via INTRAVENOUS

## 2020-03-25 SURGICAL SUPPLY — 16 items
BAG DRAIN CYSTO-URO LG1000N (MISCELLANEOUS) ×4 IMPLANT
GLOVE BIO SURGEON STRL SZ 6.5 (GLOVE) ×3 IMPLANT
GLOVE BIO SURGEONS STRL SZ 6.5 (GLOVE) ×1
GOWN STRL REUS W/ TWL LRG LVL3 (GOWN DISPOSABLE) ×4 IMPLANT
GOWN STRL REUS W/TWL LRG LVL3 (GOWN DISPOSABLE) ×8
NDL SAFETY ECLIPSE 18X1.5 (NEEDLE) ×4 IMPLANT
NEEDLE HYPO 18GX1.5 SHARP (NEEDLE) ×8
NEEDLE INJETAK FLEX 70CM BOTOX (NEEDLE) ×4 IMPLANT
PACK CYSTO AR (MISCELLANEOUS) ×4 IMPLANT
SET CYSTO W/LG BORE CLAMP LF (SET/KITS/TRAYS/PACK) ×4 IMPLANT
SOL .9 NS 3000ML IRR  AL (IV SOLUTION) ×2
SOL .9 NS 3000ML IRR AL (IV SOLUTION) ×2
SOL .9 NS 3000ML IRR UROMATIC (IV SOLUTION) ×2 IMPLANT
SURGILUBE 2OZ TUBE FLIPTOP (MISCELLANEOUS) ×4 IMPLANT
SYR 10ML LL (SYRINGE) ×8 IMPLANT
WATER STERILE IRR 1000ML POUR (IV SOLUTION) ×4 IMPLANT

## 2020-03-25 NOTE — Discharge Instructions (Signed)
Injection Treatments for Urinary Incontinence, Care After This sheet gives you information about how to care for yourself after your procedure. Your health care provider may also give you more specific instructions. If you have problems or questions, contact your health care provider. What can I expect after the procedure? After this procedure, it is common to have:  Trouble passing urine.  A small amount of blood in your urine.  A burning or stinging sensation when passing urine.  No improvement in your urinary incontinence for a few weeks. Follow these instructions at home: Catheter care   If you go home with a urinary catheter in place, care for your catheter as told by your health care provider. This may include: ? Washing your hands before and after touching the catheter. ? Keeping the area around the catheter clean and dry. ? Making sure the catheter drainage bag is lower than your hips and bladder. This stops urine from going back into the tubing and into your bladder. ? Emptying the catheter drainage bag when it is full and monitoring the amount and color of your urine. ? Checking to make sure that there are no twists or bends (kinks) in the catheter tube. ? Visiting your health care provider to have the catheter removed. Medicines   Take over-the-counter and prescription medicines only as told by your health care provider.  Ifyou were prescribed an antibiotic medicine, take it as told by your health care provider. Do not stop taking the antibiotic even if you start to feel better. General instructions   Return to your normal activities as told by your health care provider. Ask your health care provider what activities are safe for you.  Drink enough fluid to keep your urine pale yellow.  Do not take baths, swim, or use a hot tub if you have a urinary catheter in place.  Do not drive for 24 hours if you were given a medicine to help you relax (sedative) during your  procedure.  Keep all follow-up visits as told by your health care provider. This is important. Contact a health care provider if you have:  Blood in your urine for longer than a few days.  Pain when passing urine that lasts for longer than a few days.  A strong and uncomfortable urge to pass urine (urgency).  Incontinence that does not improve after a few weeks. Get help right away if you:  Have a fever.  Cannot urinate.  Have cloudy or bad-smelling urine.  Have pain when passing urine, and the pain is getting worse.  Have a lot of blood in your urine. Summary  After this procedure, it is common to have trouble passing urine and to have a small amount of blood in the urine.  Your incontinence should improve in a few weeks.  If you go home with a urinary catheter in place, care for your catheter as told by your health care provider.  Return to your normal activities as told by your health care provider. This information is not intended to replace advice given to you by your health care provider. Make sure you discuss any questions you have with your health care provider. Document Revised: 08/23/2017 Document Reviewed: 08/21/2017 Elsevier Patient Education  2020 [place]   1) The drugs that you were given will stay in your system until tomorrow so for the next 24 hours you should not:  A) Drive an automobile B) Make any legal decisions  C) Drink any alcoholic beverage   2) You may resume regular meals tomorrow.  Today it is better to start with liquids and gradually work up to solid foods.  You may eat anything you prefer, but it is better to start with liquids, then soup and crackers, and gradually work up to solid foods.   3) Please notify your doctor immediately if you have any unusual bleeding, trouble breathing, redness and pain at the surgery site, drainage, fever, or pain not relieved by  medication.    4) Additional Instructions:        Please contact your physician with any problems or Same Day Surgery at [phone], Monday through Friday 6 am to 4 pm, or [place] [facility] number at [phone].

## 2020-03-25 NOTE — Interval H&P Note (Signed)
H&P reviewed, up-to-date.  Regular rate and rhythm Clear to auscultation bilaterally  Preop urine culture negative.  All questions answered.

## 2020-03-25 NOTE — Anesthesia Procedure Notes (Signed)
Procedure Name: LMA Insertion Date/Time: 03/25/2020 7:44 AM Performed by: Lowry Bowl, CRNA Pre-anesthesia Checklist: Emergency Drugs available, Patient identified, Suction available and Patient being monitored Patient Re-evaluated:Patient Re-evaluated prior to induction Oxygen Delivery Method: Circle system utilized Preoxygenation: Pre-oxygenation with 100% oxygen Induction Type: IV induction Ventilation: Mask ventilation without difficulty LMA: LMA inserted LMA Size: 4.0 Number of attempts: 1 Placement Confirmation: positive ETCO2 and breath sounds checked- equal and bilateral Tube secured with: Tape Dental Injury: Teeth and Oropharynx as per pre-operative assessment

## 2020-03-25 NOTE — Op Note (Signed)
Date of procedure: 03/25/20  Preoperative diagnosis:  1. Refractory OAB  Postoperative diagnosis:  1. Same as above 2. Bladder lesion  Procedure: 1. Cystoscopy 2. Intravesical injection of botulinum toxin 3. Bladder biopsy  Surgeon: [name], MD  Anesthesia: General  Complications: None  Intraoperative findings: Subtle cobblestoning texture change just beyond the left UO biopsied.  Less than 1 cm.  EBL: Minimal  Specimens: None  Drains: None  Indication: Luke Coleman is a 69 y.o. patient with refractory mixed urinary incontinence, primarily OAB who presents today for Botox.  After reviewing the management options for treatment, the patient elected to proceed with the above surgical procedure(s). We have discussed the potential benefits and risks of the procedure, side effects of the proposed treatment, the likelihood of the patient achieving the goals of the procedure, and any potential problems that might occur during the procedure or recuperation. Informed consent has been obtained.  Description of procedure:  The patient was taken to the operating room and general anesthesia was induced.  The patient was placed in the dorsal lithotomy position, prepped and draped in the usual sterile fashion, and preoperative antibiotics were administered. A preoperative time-out was performed.   A 21 French rigid cystoscope was advanced per urethra into the bladder.  The bladder was carefully inspected and a very subtle area of cobblestoning and discoloration was appreciated on the left trigone beyond the left UO but not involving the structure itself.  This appeared to be likely inflammatory although neoplasm could not be ruled out.  The remainder the bladder is unremarkable.  There is mild trabeculation.  There is a nice wide open well epithelialized holep defect. At this point time, a total of 100units of botulinum toxin was injected into the muscularis propria of the bladder in a  total of 20 0.5 cc injections into rows across the posterior bladder wall.  Cold cup biopsy forceps were then brought in and used to sample the area of the left UO with a single biopsy bite.  Bugbee electrocautery was then used to fulgurate this area.  Care was taken to avoid any injury to the UO which is a good distance from the biopsy site.  The procedure was uncomplicated.  Hemostasis was excellent.  The bladder was drained and the scope was removed.  The patient was then clean and dry, repositioned the supine position, reversed by anesthesia, and taken to the PACU in stable condition  Plan: Patient will follow up in a few weeks.    [name], M.D.

## 2020-03-25 NOTE — Transfer of Care (Signed)
Immediate Anesthesia Transfer of Care Note  Patient: Luke Coleman  Procedure(s) Performed: BOTOX INJECTION (N/A ) Bladder biopsy (Left )  Patient Location: PACU  Anesthesia Type:General  Level of Consciousness: awake, oriented, drowsy and patient cooperative  Airway & Oxygen Therapy: Patient Spontanous Breathing and Patient connected to face mask oxygen  Post-op Assessment: Report given to RN, Post -op Vital signs reviewed and stable and Patient moving all extremities  Post vital signs: Reviewed and stable  Last Vitals:  Vitals Value Taken Time  BP 129/76 03/25/20 0810  Temp 37.1 C 03/25/20 0810  Pulse 87 03/25/20 0814  Resp 12 03/25/20 0814  SpO2 100 % 03/25/20 0814  Vitals shown include unvalidated device data.  Last Pain:  Vitals:   03/25/20 0810  TempSrc:   PainSc: (P) Asleep         Complications: No complications documented.

## 2020-03-26 ENCOUNTER — Encounter: Payer: Self-pay | Admitting: Urology

## 2020-03-26 LAB — SURGICAL PATHOLOGY

## 2020-03-26 NOTE — Anesthesia Postprocedure Evaluation (Signed)
Anesthesia Post Note  Patient: Luke Coleman  Procedure(s) Performed: BOTOX INJECTION (N/A ) Bladder biopsy (Left )  Patient location during evaluation: PACU Anesthesia Type: General Level of consciousness: awake and alert Pain management: pain level controlled Vital Signs Assessment: post-procedure vital signs reviewed and stable Respiratory status: spontaneous breathing, nonlabored ventilation and respiratory function stable Cardiovascular status: blood pressure returned to baseline and stable Postop Assessment: no apparent nausea or vomiting Anesthetic complications: no   No complications documented.   Last Vitals:  Vitals:   03/25/20 0838 03/25/20 0901  BP: 116/75 118/73  Pulse: 87 82  Resp: 20 18  Temp: 36.7 C (!) 36.2 C  SpO2: 98% 98%    Last Pain:  Vitals:   03/25/20 0901  TempSrc: Temporal  PainSc: 0-No pain                 Brett Canales Shayann Garbutt

## 2020-04-08 ENCOUNTER — Ambulatory Visit (INDEPENDENT_AMBULATORY_CARE_PROVIDER_SITE_OTHER): Payer: BC Managed Care – PPO | Admitting: Physician Assistant

## 2020-04-08 ENCOUNTER — Encounter: Payer: Self-pay | Admitting: Physician Assistant

## 2020-04-08 ENCOUNTER — Other Ambulatory Visit: Payer: Self-pay

## 2020-04-08 VITALS — BP 165/88 | HR 108 | Wt 204.0 lb

## 2020-04-08 DIAGNOSIS — N329 Bladder disorder, unspecified: Secondary | ICD-10-CM

## 2020-04-08 DIAGNOSIS — N3946 Mixed incontinence: Secondary | ICD-10-CM

## 2020-04-08 LAB — BLADDER SCAN AMB NON-IMAGING: Scan Result: 28

## 2020-04-08 NOTE — Progress Notes (Signed)
04/08/2020 9:30 AM   Luke Coleman Aug 13, 1951 [id number]  CC: Chief Complaint  Patient presents with  . Follow-up    Intravesical Botox  . Other    Bladder biopsy results    HPI: Luke Coleman is a 69 y.o. 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 Erlene Quan. Intraoperative findings notable for subtle cobblestoning beyond the left UO, which was biopsied for further evaluation.  Surgical pathology resulted with benign urothelial mucosa with mild reactive changes, negative for atypia or malignancy.  Today he reports initial worsening of his nocturnal enuresis immediately following Botox injections, however he feels his urinary symptoms overall have improved over the past two days. He is optimistic that the Botox is helping. He is still taking Myrbetriq 50mg  daily. PVR 45mL.  PMH: Past Medical History:  Diagnosis Date  . Acute bacterial sinusitis 08/22/2014  . COPD (chronic obstructive pulmonary disease) ([place])   . Diabetes mellitus without complication ([place])   . Enlarged prostate   . Essential (primary) hypertension 03/01/2013   Overview:  Last Assessment & Plan:  Taking meds as prescribed. Check bmet. Continue to follow.   Marland Kitchen HTN (hypertension) 03/01/2013  . Hypertension   . Sleep apnea    on CPAP, USUALLY...CURRENTLY BROKEN    Surgical History: Past Surgical History:  Procedure Laterality Date  . APPENDECTOMY    . BIOPSY Left 03/25/2020   Procedure: Bladder biopsy;  Surgeon: [name], MD;  Location: [facility] ORS;  Service: Urology;  Laterality: Left;  . BOTOX INJECTION N/A 03/25/2020   Procedure: BOTOX INJECTION;  Surgeon: [name], MD;  Location: [facility] ORS;  Service: Urology;  Laterality: N/A;  . CATARACT EXTRACTION W/PHACO Left 10/26/2018   Procedure: CATARACT EXTRACTION PHACO AND INTRAOCULAR LENS PLACEMENT (IOC) LEFT;   Surgeon: Eulogio Bear, MD;  Location: [facility] ORS;  Service: Ophthalmology;  Laterality: Left;  [name] 00:30.7 CDE 20.7 Fluid pack Lot # [id number]  . CIRCUMCISION  Age 24  . COLON SURGERY    . EYE SURGERY Right 1971   correct lazy eye  . HERNIA REPAIR    . HOLEP-LASER ENUCLEATION OF THE PROSTATE WITH MORCELLATION N/A 05/29/2019   Procedure: HOLEP-LASER ENUCLEATION OF THE PROSTATE WITH MORCELLATION;  Surgeon: [name], MD;  Location: [facility] ORS;  Service: Urology;  Laterality: N/A;  . NASAL SEPTUM SURGERY     For deviated septum  . SMALL INTESTINE SURGERY     Adhesions    Home Medications:  Allergies as of 04/08/2020      Reactions   Adhesive [tape]    Latex    Sulfa Antibiotics Rash   Penile rash      Medication List       Accurate as of April 08, 2020  9:30 AM. If you have any questions, ask your nurse or doctor.        STOP taking these medications   mirabegron ER 50 MG Tb24 tablet Commonly known as: Myrbetriq Stopped by: Debroah Loop, PA-C     TAKE these medications   albuterol (2.5 MG/3ML) 0.083% nebulizer solution Commonly known as: PROVENTIL Take 2.5 mg by nebulization every 4 (four) hours as needed for wheezing.   fluticasone 50 MCG/ACT nasal spray Commonly known as: FLONASE SHAKE LIQUID AND USE 2 SPRAYS IN EACH NOSTRIL DAILY What changed: See the new instructions.   glucose blood test strip Commonly known as:  ONE TOUCH ULTRA TEST USE TO TEST THREE TIMES DAILY   Lancets 28G Misc 3 (three) times daily. Use as instructed. DX: E11.9   lisinopril-hydrochlorothiazide 20-12.5 MG tablet Commonly known as: ZESTORETIC TAKE 1 TABLET BY MOUTH DAILY   tadalafil 5 MG tablet Commonly known as: CIALIS Take 1 tablet (5 mg total) by mouth daily as needed for erectile dysfunction.   Trulicity 4.5 EY/2.2VV Sopn Generic drug: Dulaglutide Inject 4.5 mg into the skin every 7 (seven) days.       Allergies:  Allergies  Allergen Reactions  . Adhesive  [Tape]   . Latex   . Sulfa Antibiotics Rash    Penile rash    Family History: Family History  Problem Relation Age of Onset  . Early death Mother        MVA - died age 32  . Alcohol abuse Father   . Hypertension Father   . Cancer Father 52       renal cell carcinoma  . Heart disease Father 69       MI - died  . Diabetes Brother   . Sleep apnea Brother   . Sleep apnea Daughter   . Sleep apnea Brother   . Diabetes Maternal Uncle   . Diabetes Paternal Aunt   . Kidney disease Maternal Grandmother     Social History:   reports that he has never smoked. He has never used smokeless tobacco. He reports current alcohol use. He reports that he does not use drugs.  Physical Exam: BP (!) 165/88   Pulse (!) 108   Wt 204 lb (92.5 kg)   BMI 31.95 kg/m   Constitutional:  Alert and oriented, no acute distress, nontoxic appearing HEENT: [place], AT Cardiovascular: No clubbing, cyanosis, or edema Respiratory: Normal respiratory effort, no increased work of breathing Skin: No rashes, bruises or suspicious lesions Neurologic: Grossly intact, no focal deficits, moving all 4 extremities Psychiatric: Normal mood and affect  Laboratory Data: Results for orders placed or performed in visit on 04/08/20  Bladder Scan (Post Void Residual) in office  Result Value Ref Range   Scan Result 28    Assessment & Plan:   1. Mixed incontinence Beginning to improve 2 weeks s/p intravesical Botox injections.  PVR WNL.  Counseled patient that the onset of symptomatic improvement is consistent with Botox.  I explained that he will continue to see symptomatic improvement over the coming weeks.  Discontinuing Myrbetriq due to failed trial previously.  Counseled him to contact our office when his symptoms return to prompt retreatment, estimated in around 6 months.  He expressed understanding. - Bladder Scan (Post Void Residual) in office  2. Lesion of bladder Benign per surgical pathology.  Informed patient of  results.  No further intervention indicated.  He expressed understanding.  Return if symptoms worsen or fail to improve.  Debroah Loop, PA-C  [facility] [address] [place], [place] [address] [phone]

## 2020-04-23 ENCOUNTER — Encounter: Payer: Self-pay | Admitting: Urology

## 2020-04-26 ENCOUNTER — Encounter: Payer: Self-pay | Admitting: Family Medicine

## 2020-04-26 ENCOUNTER — Ambulatory Visit (INDEPENDENT_AMBULATORY_CARE_PROVIDER_SITE_OTHER): Payer: BC Managed Care – PPO | Admitting: Family Medicine

## 2020-04-26 ENCOUNTER — Other Ambulatory Visit: Payer: Self-pay

## 2020-04-26 VITALS — BP 157/60 | HR 99 | Temp 98.2°F | Resp 18 | Ht 67.0 in | Wt 209.8 lb

## 2020-04-26 DIAGNOSIS — I10 Essential (primary) hypertension: Secondary | ICD-10-CM

## 2020-04-26 DIAGNOSIS — E669 Obesity, unspecified: Secondary | ICD-10-CM | POA: Diagnosis not present

## 2020-04-26 DIAGNOSIS — Z1211 Encounter for screening for malignant neoplasm of colon: Secondary | ICD-10-CM | POA: Diagnosis not present

## 2020-04-26 DIAGNOSIS — Z23 Encounter for immunization: Secondary | ICD-10-CM | POA: Diagnosis not present

## 2020-04-26 DIAGNOSIS — E1165 Type 2 diabetes mellitus with hyperglycemia: Secondary | ICD-10-CM

## 2020-04-26 MED ORDER — AMLODIPINE BESYLATE 10 MG PO TABS: 10.0000 mg | ORAL_TABLET | Freq: Every day | ORAL | 3 refills | Status: DC

## 2020-04-26 MED ORDER — CONTRAVE 8-90 MG PO TB12: ORAL_TABLET | ORAL | 0 refills | Status: DC

## 2020-04-26 MED ORDER — LISINOPRIL 20 MG PO TABS: 20.0000 mg | ORAL_TABLET | Freq: Every day | ORAL | 3 refills | Status: DC

## 2020-04-26 NOTE — Assessment & Plan Note (Signed)
Pt greater than age 69.  Needs annual influenza vaccine.  VIS provided.  Plan: 1. Administer high dose fluzone today.

## 2020-04-26 NOTE — Assessment & Plan Note (Signed)
Pt requiring colon cancer screening.  Denies family history of colon cancer.  Plan: - Discussed timing for initiation of colon cancer screening ACS vs USPSTF guidelines - Mutual decision making discussion for options of colonoscopy vs cologuard.  Pt prefers colonoscopy. - Referral to GI placed.

## 2020-04-26 NOTE — Progress Notes (Signed)
Subjective:    Patient ID: Nolyn Swab, male    DOB: August 20, 1951, 69 y.o.   MRN: [id number]  Concepcion Kirkpatrick is a 69 y.o. male presenting on 04/26/2020 for Hypertension (need refill on blood pressure medication)   HPI   Mr. Gelb presents to clinic for follow up on his hypertension and for concerns of his weight.  Reports he is interested in switching his blood pressure medication due to increased urinary leakage with the diuretic.  Reports he is interested in starting on medication management to help reduce his weight.  Has not been on anything like this in the past.  Hypertension - He is checking BP at home or outside of clinic.    - Current medications: lisinopril-hydrochlorothiazide 20-12.5mg , tolerating with side effects of urinary leakage - He is symptomatic with urinary leakage. - Pt denies headache, lightheadedness, dizziness, changes in vision, chest tightness/pressure, palpitations, leg swelling, sudden loss of speech or loss of consciousness. - He  reports no regular exercise routine. - His diet is high in salt, high in fat, and high in carbohydrates.  Depression screen [facility] 2/9 12/18/2019 07/28/2018 11/05/2017  Decreased Interest 0 0 0  Down, Depressed, Hopeless 0 0 0  PHQ - 2 Score 0 0 0    Social History   Tobacco Use  . Smoking status: Never Smoker  . Smokeless tobacco: Never Used  Vaping Use  . Vaping Use: Never used  Substance Use Topics  . Alcohol use: Yes    Alcohol/week: 0.0 standard drinks    Comment: Occasional  . Drug use: No    Review of Systems  Constitutional: Negative.   HENT: Negative.   Eyes: Negative.   Respiratory: Negative.   Cardiovascular: Negative.   Gastrointestinal: Negative.   Endocrine: Negative.   Genitourinary: Negative.        Urinary incontinence s/p prostate surgery, worsened with HCTZ  Musculoskeletal: Negative.   Skin: Negative.   Allergic/Immunologic: Negative.   Neurological: Negative.   Hematological: Negative.     Psychiatric/Behavioral: Negative.    Per HPI unless specifically indicated above     Objective:    BP (!) 157/60 (BP Location: Right Arm, Patient Position: Sitting, Cuff Size: Large)   Pulse 99   Temp 98.2 F (36.8 C) (Oral)   Resp 18   Ht 5\' 7"  (1.702 m)   Wt 209 lb 12.8 oz (95.2 kg)   SpO2 100%   BMI 32.86 kg/m   Wt Readings from Last 3 Encounters:  04/26/20 209 lb 12.8 oz (95.2 kg)  04/08/20 204 lb (92.5 kg)  03/25/20 (!) 204 lb (92.5 kg)    Physical Exam Vitals reviewed.  Constitutional:      General: He is not in acute distress.    Appearance: Normal appearance. He is well-developed and well-groomed. He is obese. He is not ill-appearing or toxic-appearing.  HENT:     Head: Normocephalic and atraumatic.     Nose:     Comments: Lizbeth Bark is in place, covering mouth and nose. Eyes:     General:        Right eye: No discharge.        Left eye: No discharge.     Extraocular Movements: Extraocular movements intact.     Conjunctiva/sclera: Conjunctivae normal.     Pupils: Pupils are equal, round, and reactive to light.  Cardiovascular:     Rate and Rhythm: Normal rate and regular rhythm.     Pulses: Normal pulses.     Heart  sounds: Normal heart sounds. No murmur heard.  No friction rub. No gallop.   Pulmonary:     Effort: Pulmonary effort is normal. No respiratory distress.     Breath sounds: Normal breath sounds.  Musculoskeletal:     Right lower leg: No edema.     Left lower leg: No edema.  Skin:    General: Skin is warm and dry.     Capillary Refill: Capillary refill takes less than 2 seconds.  Neurological:     General: No focal deficit present.     Mental Status: He is alert and oriented to person, place, and time.  Psychiatric:        Attention and Perception: Attention and perception normal.        Mood and Affect: Mood and affect normal.        Speech: Speech normal.        Behavior: Behavior normal. Behavior is cooperative.        Thought Content:  Thought content normal.        Cognition and Memory: Cognition and memory normal.        Judgment: Judgment normal.    Results for orders placed or performed in visit on 04/26/20  Hemoglobin A1c  Result Value Ref Range   Hemoglobin A1C 7.1%   HM DIABETES FOOT EXAM  Result Value Ref Range   HM Diabetic Foot Exam Normal  [facility]       Assessment & Plan:   Problem List Items Addressed This Visit      Cardiovascular and Mediastinum   Essential hypertension - Primary    Uncontrolled hypertension.  BP is not at goal < 130/80.  Pt is working on lifestyle modifications.  Taking medications tolerating well without side effects.  Complications:  Obesity, T2DM, OSA  Plan: 1. STOP Lisinopril-HCTZ 20-12.5mg  daily.  BEGIN lisinopril 20mg  daily AND Amlodipine 10mg  daily 2. Obtain labs at next visit  3. Encouraged heart healthy diet and increasing exercise to 30 minutes most days of the week, going no more than 2 days in a row without exercise. 4. Check BP 1-2 x per day at home, keep log, and bring to clinic at next appointment. 5. Follow up 2 weeks.         Relevant Medications   lisinopril (ZESTRIL) 20 MG tablet   amLODipine (NORVASC) 10 MG tablet     Endocrine   Diabetes mellitus, type 2 (HCC)    Stable and well controlled.  Follows with [facility], Dr. Honor Junes.      Relevant Medications   lisinopril (ZESTRIL) 20 MG tablet     Other   Obesity (BMI 30-39.9)    Is interested in starting medication for weight management.  Is presently on a GLP-1 for his T2DM and discussed options such as Ally and Contrave.  Interested in starting [place].  Discussed can be on this medication for up to 12 weeks, will help with cravings and he should plan to work on building new relationships with food and exercise over this time to make changes that will last.  Plan: 1. Begin contrave as directed 2. RTC in 4 weeks for weight check      Relevant Medications    Naltrexone-buPROPion HCl ER (CONTRAVE) 8-90 MG TB12   Colon cancer screening    Pt requiring colon cancer screening.  Denies family history of colon cancer.  Plan: - Discussed timing for initiation of colon cancer screening ACS vs USPSTF guidelines - Mutual decision making  discussion for options of colonoscopy vs cologuard.  Pt prefers colonoscopy. - Referral to GI placed.       Relevant Orders   Ambulatory referral to Gastroenterology   Need for diphtheria-tetanus-pertussis (Tdap) vaccine    Pt needs tetanus vaccine.  > 10 years since last vaccination.  VIS provided.  Plan: 1. Reviewed tetanus disease and need for vaccination. 2. Administer vaccine today.       Relevant Orders   Tdap vaccine greater than or equal to 7yo IM   Influenza vaccination given    Pt greater than age 74.  Needs annual influenza vaccine.  VIS provided.  Plan: 1. Administer high dose fluzone today.          Meds ordered this encounter  Medications  . lisinopril (ZESTRIL) 20 MG tablet    Sig: Take 1 tablet (20 mg total) by mouth daily.    Dispense:  90 tablet    Refill:  3  . amLODipine (NORVASC) 10 MG tablet    Sig: Take 1 tablet (10 mg total) by mouth daily.    Dispense:  90 tablet    Refill:  3  . Naltrexone-buPROPion HCl ER (CONTRAVE) 8-90 MG TB12    Sig: Start 1 tablet every morning for 7 days, then 1 tablet twice daily for 7 days, then 2 tablets every morning and one every evening    Dispense:  120 tablet    Refill:  0   Follow up plan: Return in about 2 weeks (around 05/10/2020) for HTN F/U.   Harlin Rain, [place] [facility] Nurse Practitioner [facility] 04/26/2020, 10:25 AM

## 2020-04-26 NOTE — Assessment & Plan Note (Signed)
Is interested in starting medication for weight management.  Is presently on a GLP-1 for his T2DM and discussed options such as Ally and Contrave.  Interested in starting [place].  Discussed can be on this medication for up to 12 weeks, will help with cravings and he should plan to work on building new relationships with food and exercise over this time to make changes that will last.  Plan: 1. Begin contrave as directed 2. RTC in 4 weeks for weight check

## 2020-04-26 NOTE — Assessment & Plan Note (Signed)
Uncontrolled hypertension.  BP is not at goal < 130/80.  Pt is working on lifestyle modifications.  Taking medications tolerating well without side effects.  Complications:  Obesity, T2DM, OSA  Plan: 1. STOP Lisinopril-HCTZ 20-12.5mg  daily.  BEGIN lisinopril 20mg  daily AND Amlodipine 10mg  daily 2. Obtain labs at next visit  3. Encouraged heart healthy diet and increasing exercise to 30 minutes most days of the week, going no more than 2 days in a row without exercise. 4. Check BP 1-2 x per day at home, keep log, and bring to clinic at next appointment. 5. Follow up 2 weeks.

## 2020-04-26 NOTE — Assessment & Plan Note (Signed)
Stable and well controlled.  Follows with [facility], Dr. Honor Junes.

## 2020-04-26 NOTE — Assessment & Plan Note (Signed)
Pt needs tetanus vaccine.  > 10 years since last vaccination.  VIS provided.  Plan: 1. Reviewed tetanus disease and need for vaccination. 2. Administer vaccine today.  

## 2020-04-26 NOTE — Patient Instructions (Signed)
As we discussed, I have switched your blood pressure medication, taking you off the Lisinopril-HCTZ 20-12.5mg  for your urinary concerns.  I have sent in a prescription for lisinopril 20mg  daily and amlodipine 10mg  daily.  Try to get exercise a minimum of 30 minutes per day at least 5 days per week as well as  adequate water intake all while measuring blood pressure a few times per week.  Keep a blood pressure log and bring back to clinic at your next visit.  If your readings are consistently over 130/80 to contact our office/send me a MyChart message and we will see you sooner.  Can try DASH and Mediterranean diet options, avoiding processed foods, lowering sodium intake, avoiding pork products, and eating a plant based diet for optimal health.  I have put in a referral for a colonoscopy.  You should hear from their office within the next week or so to schedule an appointment.  We will plan to see you back in 2 weeks for hypertension  You will receive a survey after today's visit either digitally by e-mail or paper by [place] mail. Your experiences and feedback matter to [name].  Please respond so we know how we are doing as we provide care for you.  Call us with any questions/concerns/needs.  It is my goal to be available to you for your health concerns.  Thanks for choosing me to be a partner in your healthcare needs!  Harlin Rain, FNP-C Family Nurse Practitioner [facility] Phone: [phone]

## 2020-05-01 ENCOUNTER — Encounter: Payer: Self-pay | Admitting: Family Medicine

## 2020-05-01 ENCOUNTER — Other Ambulatory Visit: Payer: Self-pay | Admitting: Family Medicine

## 2020-05-01 DIAGNOSIS — E669 Obesity, unspecified: Secondary | ICD-10-CM

## 2020-05-01 MED ORDER — CONTRAVE 8-90 MG PO TB12: ORAL_TABLET | ORAL | 0 refills | Status: DC

## 2020-05-06 ENCOUNTER — Telehealth (INDEPENDENT_AMBULATORY_CARE_PROVIDER_SITE_OTHER): Payer: Self-pay | Admitting: Gastroenterology

## 2020-05-06 ENCOUNTER — Other Ambulatory Visit: Payer: Self-pay

## 2020-05-06 DIAGNOSIS — Z1211 Encounter for screening for malignant neoplasm of colon: Secondary | ICD-10-CM

## 2020-05-06 MED ORDER — CLENPIQ 10-3.5-12 MG-GM -GM/160ML PO SOLN: 1.0000 | Freq: Once | ORAL | 0 refills | Status: AC

## 2020-05-06 NOTE — Progress Notes (Signed)
Gastroenterology Pre-Procedure Review  Request Date: Thursday 05/16/20 Requesting Physician: Dr. Bonna Gains  PATIENT REVIEW QUESTIONS: The patient responded to the following health history questions as indicated:    1. Are you having any GI issues? no Trulicity causes stomach cramps. 2. Do you have a personal history of Polyps? yes (2006 Pt states he had colon polyps) 3. Do you have a family history of Colon Cancer or Polyps? yes (brother colon polyps) 4. Diabetes Mellitus? yes (type 2 takes trulicity) 5. Joint replacements in the past 12 months?no 6. Major health problems in the past 3 months?prostate procedure took place in October of 2020 7. Any artificial heart valves, MVP, or defibrillator?no    MEDICATIONS & ALLERGIES:    Patient reports the following regarding taking any anticoagulation/antiplatelet therapy:   Plavix, Coumadin, Eliquis, Xarelto, Lovenox, Pradaxa, Brilinta, or Effient? no Aspirin? no  Patient confirms/reports the following medications:  Current Outpatient Medications  Medication Sig Dispense Refill  . Dulaglutide (TRULICITY) 4.5 TW/6.5KC SOPN Inject 4.5 mg into the skin every 7 (seven) days.     . fluticasone (FLONASE) 50 MCG/ACT nasal spray SHAKE LIQUID AND USE 2 SPRAYS IN EACH NOSTRIL DAILY (Patient taking differently: Place 1 spray into both nostrils daily. ) 16 g 0  . glucose blood (ONE TOUCH ULTRA TEST) test strip USE TO TEST THREE TIMES DAILY 100 each 11  . Insulin Glargine (BASAGLAR KWIKPEN) 100 UNIT/ML Inject into the skin.    . Insulin Pen Needle (BD PEN NEEDLE NANO 2ND GEN) 32G X 4 MM MISC Use 1 each once daily    . Lancets 28G MISC 3 (three) times daily. Use as instructed. DX: E11.9    . lisinopril (ZESTRIL) 20 MG tablet Take 1 tablet (20 mg total) by mouth daily. 90 tablet 3  . Naltrexone-buPROPion HCl ER (CONTRAVE) 8-90 MG TB12 Start 1 tablet every morning for 7 days, then 1 tablet twice daily for 7 days, then 2 tablets every morning and one every  evening 120 tablet 0  . albuterol (PROVENTIL) (2.5 MG/3ML) 0.083% nebulizer solution Take 2.5 mg by nebulization every 4 (four) hours as needed for wheezing.  (Patient not taking: Reported on 05/06/2020)    . amLODipine (NORVASC) 10 MG tablet Take 1 tablet (10 mg total) by mouth daily. (Patient not taking: Reported on 05/06/2020) 90 tablet 3  . Sod Picosulfate-Mag Ox-Cit Acd (CLENPIQ) 10-3.5-12 MG-GM -GM/160ML SOLN Take 1 kit by mouth once for 1 dose. The evening before procedure drink one bottle of prep at 5pm, followed by 16 oz of water.  Repeat 5 hours prior to colonoscopy.  Do not eat or drink anything 4 hours prior to colonoscopy. 320 mL 0   No current facility-administered medications for this visit.    Patient confirms/reports the following allergies:  Allergies  Allergen Reactions  . Adhesive [Tape]   . Latex   . Sulfa Antibiotics Rash    Penile rash    No orders of the defined types were placed in this encounter.   AUTHORIZATION INFORMATION Primary Insurance: 1D#: Group #:  Secondary Insurance: 1D#: Group #:  SCHEDULE INFORMATION: Date: Thursday 05/16/20 Time: Location:[facility]

## 2020-05-09 ENCOUNTER — Other Ambulatory Visit: Payer: Self-pay | Admitting: Family Medicine

## 2020-05-10 ENCOUNTER — Ambulatory Visit (INDEPENDENT_AMBULATORY_CARE_PROVIDER_SITE_OTHER): Payer: BC Managed Care – PPO | Admitting: Family Medicine

## 2020-05-10 ENCOUNTER — Encounter: Payer: Self-pay | Admitting: Family Medicine

## 2020-05-10 ENCOUNTER — Other Ambulatory Visit: Payer: Self-pay

## 2020-05-10 VITALS — BP 126/76 | HR 82 | Ht 67.0 in | Wt 209.8 lb

## 2020-05-10 DIAGNOSIS — Z1211 Encounter for screening for malignant neoplasm of colon: Secondary | ICD-10-CM | POA: Diagnosis not present

## 2020-05-10 DIAGNOSIS — E1165 Type 2 diabetes mellitus with hyperglycemia: Secondary | ICD-10-CM | POA: Diagnosis not present

## 2020-05-10 DIAGNOSIS — E1159 Type 2 diabetes mellitus with other circulatory complications: Secondary | ICD-10-CM

## 2020-05-10 DIAGNOSIS — I10 Essential (primary) hypertension: Secondary | ICD-10-CM

## 2020-05-10 MED ORDER — LISINOPRIL 20 MG PO TABS: 30.0000 mg | ORAL_TABLET | Freq: Every day | ORAL | 0 refills | Status: DC

## 2020-05-10 NOTE — Patient Instructions (Signed)
I have increased your lisinopril from 20mg  to 30mg  daily.  Take your blood pressure once per day, at least 1 hour after taking your blood pressure medications.  If you are finding that your blood pressure continues to be over 130/80, can increase to lisinopril 40mg  daily  Continue to take your amlodipine 10mg  daily.  We will plan to see you back in 2-4 weeks for blood pressure follow up visit  You will receive a survey after today's visit either digitally by e-mail or paper by [place] mail. Your experiences and feedback matter to [name].  Please respond so we know how we are doing as we provide care for you.  Call us with any questions/concerns/needs.  It is my goal to be available to you for your health concerns.  Thanks for choosing me to be a partner in your healthcare needs!  Harlin Rain, FNP-C Family Nurse Practitioner [facility] Phone: [phone]

## 2020-05-10 NOTE — Assessment & Plan Note (Signed)
Scheduled for Colonoscopy 05/16/2020

## 2020-05-10 NOTE — Assessment & Plan Note (Signed)
Stable and well controlled, follows with [facility], Dr. Honor Junes

## 2020-05-10 NOTE — Progress Notes (Signed)
Subjective:    Patient ID: Luke Coleman, male    DOB: Dec 25, 1950, 69 y.o.   MRN: [id number]  Luke Coleman is a 69 y.o. male presenting on 05/10/2020 for Hypertension   HPI  Hypertension  - He is checking BP at home or outside of clinic.  Readings >140/150/70-90's - Current medications: amlodipine 10mg  daily and lisinopril 20mg  daily, tolerating well without side effects - He is not currently symptomatic. - Pt denies headache, lightheadedness, dizziness, changes in vision, chest tightness/pressure, palpitations, leg swelling, sudden loss of speech or loss of consciousness. - He  reports no regular exercise routine. - His diet is moderate in salt, moderate in fat, and moderate in carbohydrates.   Depression screen [facility] 2/9 12/18/2019 07/28/2018 11/05/2017  Decreased Interest 0 0 0  Down, Depressed, Hopeless 0 0 0  PHQ - 2 Score 0 0 0    Social History   Tobacco Use  . Smoking status: Never Smoker  . Smokeless tobacco: Never Used  Vaping Use  . Vaping Use: Never used  Substance Use Topics  . Alcohol use: Yes    Alcohol/week: 0.0 standard drinks    Comment: Occasional  . Drug use: No    Review of Systems  Constitutional: Negative.   HENT: Negative.   Eyes: Negative.   Respiratory: Negative.   Cardiovascular: Negative.   Gastrointestinal: Negative.   Endocrine: Negative.   Genitourinary: Negative.   Musculoskeletal: Negative.   Skin: Negative.   Allergic/Immunologic: Negative.   Neurological: Negative.   Hematological: Negative.   Psychiatric/Behavioral: Negative.    Per HPI unless specifically indicated above     Objective:    BP 126/76 (BP Location: Left Arm, Patient Position: Sitting, Cuff Size: Large)   Pulse 82   Ht 5\' 7"  (1.702 m)   Wt 209 lb 12.8 oz (95.2 kg)   SpO2 100%   BMI 32.86 kg/m   Wt Readings from Last 3 Encounters:  05/10/20 209 lb 12.8 oz (95.2 kg)  04/26/20 209 lb 12.8 oz (95.2 kg)  04/08/20 204 lb (92.5 kg)    Physical Exam Vitals  reviewed.  Constitutional:      General: He is not in acute distress.    Appearance: Normal appearance. He is well-developed and well-groomed. He is obese. He is not ill-appearing or toxic-appearing.  HENT:     Head: Normocephalic and atraumatic.     Nose:     Comments: Lizbeth Bark is in place, covering mouth and nose. Eyes:     General:        Right eye: No discharge.        Left eye: No discharge.     Extraocular Movements: Extraocular movements intact.     Conjunctiva/sclera: Conjunctivae normal.     Pupils: Pupils are equal, round, and reactive to light.  Cardiovascular:     Rate and Rhythm: Normal rate and regular rhythm.     Pulses: Normal pulses.          Dorsalis pedis pulses are 2+ on the right side and 2+ on the left side.     Heart sounds: Normal heart sounds. No murmur heard.  No friction rub. No gallop.   Pulmonary:     Effort: Pulmonary effort is normal. No respiratory distress.     Breath sounds: Normal breath sounds.  Musculoskeletal:     Right lower leg: No edema.     Left lower leg: No edema.  Skin:    General: Skin is warm and dry.  Capillary Refill: Capillary refill takes less than 2 seconds.  Neurological:     General: No focal deficit present.     Mental Status: He is alert and oriented to person, place, and time.  Psychiatric:        Attention and Perception: Attention and perception normal.        Mood and Affect: Mood and affect normal.        Speech: Speech normal.        Behavior: Behavior normal. Behavior is cooperative.        Thought Content: Thought content normal.        Cognition and Memory: Cognition and memory normal.    Results for orders placed or performed in visit on 04/26/20  Hemoglobin A1c  Result Value Ref Range   Hemoglobin A1C 7.1%   HM DIABETES FOOT EXAM  Result Value Ref Range   HM Diabetic Foot Exam Normal  [facility]       Assessment & Plan:   Problem List Items Addressed This Visit      Cardiovascular and  Mediastinum   Hypertension associated with diabetes ([place]) - Primary    Uncontrolled hypertension on review of home BP log showing consistent 140-150 SBP/70-90 DBP.  BP is not at goal < 130/80.  Pt is working on lifestyle modifications.  Taking medications tolerating well without side effects.  Complications: Obesity, G1WE, OSA  Plan: 1. Continue taking amlodipine 10mg  daily and INCREASE lisinopril to 30mg  daily for the next 1-2 weeks, if finding BP is not <130/80, can INCREASE to 40mg  daily 2. Obtain labs in the next 1-2 weeks  3. Encouraged heart healthy diet and increasing exercise to 30 minutes most days of the week, going no more than 2 days in a row without exercise. 4. Check BP 1-2 x per day at home, keep log, and bring to clinic at next appointment. 5. Follow up 2-4 weeks.         Relevant Medications   lisinopril (ZESTRIL) 20 MG tablet     Endocrine   Diabetes mellitus, type 2 ([place])    Stable and well controlled, follows with [facility] Dr. Honor Junes      Relevant Medications   lisinopril (ZESTRIL) 20 MG tablet     Other   Colon cancer screening    Scheduled for Colonoscopy 05/16/2020         Meds ordered this encounter  Medications  . lisinopril (ZESTRIL) 20 MG tablet    Sig: Take 1.5 tablets (30 mg total) by mouth daily.    Dispense:  90 tablet    Refill:  0    Follow up plan: Return in about 4 weeks (around 06/07/2020) for BP F/U.   Harlin Rain, [place] [facility] Nurse Practitioner [facility] 05/10/2020, 8:33 AM

## 2020-05-10 NOTE — Assessment & Plan Note (Signed)
Uncontrolled hypertension on review of home BP log showing consistent 140-150 SBP/70-90 DBP.  BP is not at goal < 130/80.  Pt is working on lifestyle modifications.  Taking medications tolerating well without side effects.  Complications: Obesity, H4KB, OSA  Plan: 1. Continue taking amlodipine 10mg  daily and INCREASE lisinopril to 30mg  daily for the next 1-2 weeks, if finding BP is not <130/80, can INCREASE to 40mg  daily 2. Obtain labs in the next 1-2 weeks  3. Encouraged heart healthy diet and increasing exercise to 30 minutes most days of the week, going no more than 2 days in a row without exercise. 4. Check BP 1-2 x per day at home, keep log, and bring to clinic at next appointment. 5. Follow up 2-4 weeks.

## 2020-05-14 ENCOUNTER — Other Ambulatory Visit: Payer: Self-pay

## 2020-05-14 ENCOUNTER — Other Ambulatory Visit
Admission: RE | Admit: 2020-05-14 | Discharge: 2020-05-14 | Disposition: A | Discharge status: Home / Self Care | Payer: BC Managed Care – PPO | Source: Ambulatory Visit | Attending: Gastroenterology | Admitting: Gastroenterology

## 2020-05-14 DIAGNOSIS — Z20822 Contact with and (suspected) exposure to covid-19: Secondary | ICD-10-CM | POA: Insufficient documentation

## 2020-05-14 DIAGNOSIS — Z01812 Encounter for preprocedural laboratory examination: Secondary | ICD-10-CM | POA: Diagnosis not present

## 2020-05-15 ENCOUNTER — Encounter: Payer: Self-pay | Admitting: Gastroenterology

## 2020-05-15 LAB — SARS CORONAVIRUS 2 (TAT 6-24 HRS): SARS Coronavirus 2: NEGATIVE

## 2020-05-16 ENCOUNTER — Ambulatory Visit: Payer: BC Managed Care – PPO | Admitting: Certified Registered Nurse Anesthetist

## 2020-05-16 ENCOUNTER — Other Ambulatory Visit: Payer: Self-pay

## 2020-05-16 ENCOUNTER — Encounter
Admission: RE | Disposition: A | Discharge status: Home / Self Care | Payer: Self-pay | Source: Home / Self Care | Attending: Gastroenterology

## 2020-05-16 ENCOUNTER — Encounter: Payer: Self-pay | Admitting: Gastroenterology

## 2020-05-16 ENCOUNTER — Ambulatory Visit
Admission: RE | Admit: 2020-05-16 | Discharge: 2020-05-16 | Disposition: A | Discharge status: Home / Self Care | Payer: BC Managed Care – PPO | Attending: Gastroenterology | Admitting: Gastroenterology

## 2020-05-16 DIAGNOSIS — G4730 Sleep apnea, unspecified: Secondary | ICD-10-CM | POA: Insufficient documentation

## 2020-05-16 DIAGNOSIS — Z8249 Family history of ischemic heart disease and other diseases of the circulatory system: Secondary | ICD-10-CM | POA: Insufficient documentation

## 2020-05-16 DIAGNOSIS — Z833 Family history of diabetes mellitus: Secondary | ICD-10-CM | POA: Diagnosis not present

## 2020-05-16 DIAGNOSIS — K5730 Diverticulosis of large intestine without perforation or abscess without bleeding: Secondary | ICD-10-CM | POA: Diagnosis not present

## 2020-05-16 DIAGNOSIS — Z882 Allergy status to sulfonamides status: Secondary | ICD-10-CM | POA: Diagnosis not present

## 2020-05-16 DIAGNOSIS — E119 Type 2 diabetes mellitus without complications: Secondary | ICD-10-CM | POA: Insufficient documentation

## 2020-05-16 DIAGNOSIS — D125 Benign neoplasm of sigmoid colon: Secondary | ICD-10-CM | POA: Diagnosis not present

## 2020-05-16 DIAGNOSIS — D122 Benign neoplasm of ascending colon: Secondary | ICD-10-CM | POA: Insufficient documentation

## 2020-05-16 DIAGNOSIS — Z1211 Encounter for screening for malignant neoplasm of colon: Secondary | ICD-10-CM

## 2020-05-16 DIAGNOSIS — I10 Essential (primary) hypertension: Secondary | ICD-10-CM | POA: Insufficient documentation

## 2020-05-16 DIAGNOSIS — D120 Benign neoplasm of cecum: Secondary | ICD-10-CM | POA: Diagnosis not present

## 2020-05-16 DIAGNOSIS — K635 Polyp of colon: Secondary | ICD-10-CM

## 2020-05-16 DIAGNOSIS — Z91040 Latex allergy status: Secondary | ICD-10-CM | POA: Diagnosis not present

## 2020-05-16 DIAGNOSIS — N400 Enlarged prostate without lower urinary tract symptoms: Secondary | ICD-10-CM | POA: Diagnosis not present

## 2020-05-16 DIAGNOSIS — D123 Benign neoplasm of transverse colon: Secondary | ICD-10-CM | POA: Diagnosis not present

## 2020-05-16 DIAGNOSIS — Z794 Long term (current) use of insulin: Secondary | ICD-10-CM | POA: Diagnosis not present

## 2020-05-16 DIAGNOSIS — J449 Chronic obstructive pulmonary disease, unspecified: Secondary | ICD-10-CM | POA: Diagnosis not present

## 2020-05-16 DIAGNOSIS — Z79899 Other long term (current) drug therapy: Secondary | ICD-10-CM | POA: Insufficient documentation

## 2020-05-16 HISTORY — PX: COLONOSCOPY WITH PROPOFOL: SHX5780

## 2020-05-16 LAB — GLUCOSE, CAPILLARY: Glucose-Capillary: 126 mg/dL — ABNORMAL HIGH (ref 70–99)

## 2020-05-16 SURGERY — COLONOSCOPY WITH PROPOFOL
Anesthesia: General

## 2020-05-16 MED ORDER — LIDOCAINE HCL (CARDIAC) PF 100 MG/5ML IV SOSY
PREFILLED_SYRINGE | INTRAVENOUS | Status: DC | PRN
  Administered 2020-05-16: 50 mg via INTRAVENOUS

## 2020-05-16 MED ORDER — MIDAZOLAM HCL 2 MG/2ML IJ SOLN
INTRAMUSCULAR | Status: AC
  Filled 2020-05-16: qty 2

## 2020-05-16 MED ORDER — PROPOFOL 500 MG/50ML IV EMUL
INTRAVENOUS | Status: AC
  Filled 2020-05-16: qty 50

## 2020-05-16 MED ORDER — MIDAZOLAM HCL 5 MG/5ML IJ SOLN
INTRAMUSCULAR | Status: DC | PRN
  Administered 2020-05-16: 2 mg via INTRAVENOUS

## 2020-05-16 MED ORDER — LIDOCAINE HCL (PF) 2 % IJ SOLN
INTRAMUSCULAR | Status: AC
  Filled 2020-05-16: qty 5

## 2020-05-16 MED ORDER — PROPOFOL 10 MG/ML IV BOLUS
INTRAVENOUS | Status: DC | PRN
  Administered 2020-05-16: 50 mg via INTRAVENOUS
  Administered 2020-05-16: 20 mg via INTRAVENOUS

## 2020-05-16 MED ORDER — PROPOFOL 500 MG/50ML IV EMUL
INTRAVENOUS | Status: DC | PRN
  Administered 2020-05-16: 200 ug/kg/min via INTRAVENOUS

## 2020-05-16 MED ORDER — SODIUM CHLORIDE 0.9 % IV SOLN: INTRAVENOUS | Status: DC

## 2020-05-16 NOTE — Anesthesia Postprocedure Evaluation (Signed)
Anesthesia Post Note  Patient: Luke Coleman  Procedure(s) Performed: COLONOSCOPY WITH PROPOFOL (N/A )  Patient location during evaluation: Endoscopy Anesthesia Type: General Level of consciousness: awake and alert Pain management: pain level controlled Vital Signs Assessment: post-procedure vital signs reviewed and stable Respiratory status: spontaneous breathing and respiratory function stable Cardiovascular status: stable Anesthetic complications: no   No complications documented.   Last Vitals:  Vitals:   05/16/20 1010 05/16/20 1020  BP: (!) 102/58 110/61  Pulse: (!) 42 (!) 40  Resp: 15 13  Temp:    SpO2: 99% 98%    Last Pain:  Vitals:   05/16/20 1020  TempSrc:   PainSc: 0-No pain                 Devaun Hernandez K

## 2020-05-16 NOTE — Transfer of Care (Signed)
Immediate Anesthesia Transfer of Care Note  Patient: Luke Coleman  Procedure(s) Performed: COLONOSCOPY WITH PROPOFOL (N/A )  Patient Location: PACU  Anesthesia Type:General  Level of Consciousness: drowsy  Airway & Oxygen Therapy: Patient Spontanous Breathing  Post-op Assessment: Report given to RN and Post -op Vital signs reviewed and stable  Post vital signs: Reviewed and stable  Last Vitals:  Vitals Value Taken Time  BP 90/67 05/16/20 1000  Temp    Pulse 86 05/16/20 1001  Resp 13 05/16/20 1001  SpO2 95 % 05/16/20 1001  Vitals shown include unvalidated device data.  Last Pain:  Vitals:   05/16/20 0821  TempSrc: Temporal  PainSc: 0-No pain         Complications: No complications documented.

## 2020-05-16 NOTE — Anesthesia Preprocedure Evaluation (Signed)
Anesthesia Evaluation  Patient identified by MRN, date of birth, ID band Patient awake    Reviewed: Allergy & Precautions, NPO status , Patient's Chart, lab work & pertinent test results  History of Anesthesia Complications Negative for: history of anesthetic complications  Airway Mallampati: III       Dental   Pulmonary sleep apnea and Continuous Positive Airway Pressure Ventilation , COPD,  COPD inhaler,           Cardiovascular hypertension, Pt. on medications (-) Past MI and (-) CHF (-) dysrhythmias (-) Valvular Problems/Murmurs     Neuro/Psych neg Seizures    GI/Hepatic Neg liver ROS, neg GERD  ,  Endo/Other  diabetes, Type 2, Oral Hypoglycemic Agents, Insulin Dependent  Renal/GU negative Renal ROS     Musculoskeletal   Abdominal   Peds  Hematology   Anesthesia Other Findings   Reproductive/Obstetrics                             Anesthesia Physical Anesthesia Plan  ASA: III  Anesthesia Plan: General   Post-op Pain Management:    Induction: Intravenous  PONV Risk Score and Plan: 2 and Propofol infusion and TIVA  Airway Management Planned: Nasal Cannula  Additional Equipment:   Intra-op Plan:   Post-operative Plan:   Informed Consent: I have reviewed the patients History and Physical, chart, labs and discussed the procedure including the risks, benefits and alternatives for the proposed anesthesia with the patient or authorized representative who has indicated his/her understanding and acceptance.       Plan Discussed with:   Anesthesia Plan Comments:         Anesthesia Quick Evaluation

## 2020-05-16 NOTE — Op Note (Signed)
[facility] Patient Name: Luke Coleman Procedure Date: 05/16/2020 9:13 AM MRN: [id number] Account #: [id number] Date of Birth: 1951-06-27 Admit Type: Outpatient Age: 69 Room: [facility] ENDO ROOM 3 Gender: Male Note Status: Finalized Procedure:             Colonoscopy Indications:           Screening for colorectal malignant neoplasm Providers:             Liley Rake B. Bonna Gains MD, MD Referring MD:          Lupita Raider. Malfi (Referring MD) Medicines:             Monitored Anesthesia Care Complications:         No immediate complications. Procedure:             Pre-Anesthesia Assessment:                        - ASA Grade Assessment: II - A patient with mild                         systemic disease.                        - Prior to the procedure, a History and Physical was                         performed, and patient medications, allergies and                         sensitivities were reviewed. The patient's tolerance                         of previous anesthesia was reviewed.                        - The risks and benefits of the procedure and the                         sedation options and risks were discussed with the                         patient. All questions were answered and informed                         consent was obtained.                        - Patient identification and proposed procedure were                         verified prior to the procedure by the physician, the                         nurse, the anesthesiologist, the anesthetist and the                         technician. The procedure was verified in the                         procedure room.  After obtaining informed consent, the colonoscope was                         passed under direct vision. Throughout the procedure,                         the patient's blood pressure, pulse, and oxygen                         saturations were monitored  continuously. The                         Colonoscope was introduced through the anus and                         advanced to the the cecum, identified by appendiceal                         orifice and ileocecal valve. The colonoscopy was                         performed with ease. The patient tolerated the                         procedure well. The quality of the bowel preparation                         was good. Findings:      The perianal and digital rectal examinations were normal.      Three flat polyps were found in the transverse colon and cecum. The       polyps were 3 to 4 mm in size. These polyps were removed with a jumbo       cold forceps. Resection and retrieval were complete.      Four flat polyps were found in the sigmoid colon, transverse colon and       ascending colon. The polyps were 5 to 6 mm in size. These polyps were       removed with a cold snare. Resection and retrieval were complete.      There was a medium-sized lipoma, in the transverse colon. Biopsies were       taken with a cold forceps for histology. Bright yellow material seen on       biopsies consistent with lipoma.      Multiple diverticula were found in the entire colon.      The exam was otherwise without abnormality.      The rectum, sigmoid colon, descending colon, transverse colon, ascending       colon and cecum appeared normal.      The retroflexed view of the distal rectum and anal verge was normal and       showed no anal or rectal abnormalities. Impression:            - Three 3 to 4 mm polyps in the transverse colon and                         in the cecum, removed with a jumbo cold forceps.  Resected and retrieved.                        - Four 5 to 6 mm polyps in the sigmoid colon, in the                         transverse colon and in the ascending colon, removed                         with a cold snare. Resected and retrieved.                        -  Medium-sized lipoma in the transverse colon.                         Biopsied.                        - Diverticulosis in the entire examined colon.                        - The examination was otherwise normal.                        - The rectum, sigmoid colon, descending colon,                         transverse colon, ascending colon and cecum are normal.                        - The distal rectum and anal verge are normal on                         retroflexion view. Recommendation:        - Discharge patient to home (with escort).                        - High fiber diet.                        - Advance diet as tolerated.                        - Continue present medications.                        - Await pathology results.                        - Repeat colonoscopy in 3 years.                        - The findings and recommendations were discussed with                         the patient.                        - The findings and recommendations were discussed with  the patient's family.                        - Return to primary care physician as previously                         scheduled. Procedure Code(s):     --- Professional ---                        [phone], Colonoscopy, flexible; with removal of                         tumor(s), polyp(s), or other lesion(s) by snare                         technique                        45380, 96, Colonoscopy, flexible; with biopsy, single                         or multiple Diagnosis Code(s):     --- Professional ---                        Z12.11, Encounter for screening for malignant neoplasm                         of colon                        K63.5, Polyp of colon CPT copyright 2019 American Medical Association. All rights reserved. The codes documented in this report are preliminary and upon coder review may  be revised to meet current compliance requirements.  Vonda Antigua, MD Margretta Sidle B.  Bonna Gains MD, MD 05/16/2020 10:08:19 AM This report has been signed electronically. Number of Addenda: 0 Note Initiated On: 05/16/2020 9:13 AM Scope Withdrawal Time: 0 hours 30 minutes 2 seconds  Total Procedure Duration: 0 hours 32 minutes 29 seconds  Estimated Blood Loss:  Estimated blood loss: none.      [facility]

## 2020-05-16 NOTE — H&P (Signed)
Luke Antigua, MD [address] [place], [place], [place], [address] [address], [place], [place], [place], [address] Phone: [phone]  Fax: [phone]  Primary Care Physician:  Verl Bangs, FNP   Pre-Procedure History & Physical: HPI:  Axell Trigueros is a 69 y.o. male is here for a colonoscopy.   Past Medical History:  Diagnosis Date  . Acute bacterial sinusitis 08/22/2014  . COPD (chronic obstructive pulmonary disease) ([place])   . Diabetes mellitus without complication ([place])   . Enlarged prostate   . Essential (primary) hypertension 03/01/2013   Overview:  Last Assessment & Plan:  Taking meds as prescribed. Check bmet. Continue to follow.   Marland Kitchen HTN (hypertension) 03/01/2013  . Hypertension   . Sleep apnea    on CPAP, USUALLY...CURRENTLY BROKEN    Past Surgical History:  Procedure Laterality Date  . APPENDECTOMY    . BIOPSY Left 03/25/2020   Procedure: Bladder biopsy;  Surgeon: [name], MD;  Location: [facility] ORS;  Service: Urology;  Laterality: Left;  . BOTOX INJECTION N/A 03/25/2020   Procedure: BOTOX INJECTION;  Surgeon: [name], MD;  Location: [facility] ORS;  Service: Urology;  Laterality: N/A;  . CATARACT EXTRACTION W/PHACO Left 10/26/2018   Procedure: CATARACT EXTRACTION PHACO AND INTRAOCULAR LENS PLACEMENT (IOC) LEFT;  Surgeon: Eulogio Bear, MD;  Location: [facility] ORS;  Service: Ophthalmology;  Laterality: Left;  [name] 00:30.7 CDE 20.7 Fluid pack Lot # [id number]  . CIRCUMCISION  Age 9  . COLON SURGERY    . EYE SURGERY Right 1971   correct lazy eye  . HERNIA REPAIR    . HOLEP-LASER ENUCLEATION OF THE PROSTATE WITH MORCELLATION N/A 05/29/2019   Procedure: HOLEP-LASER ENUCLEATION OF THE PROSTATE WITH MORCELLATION;  Surgeon: [name], MD;  Location: [facility] ORS;  Service: Urology;  Laterality: N/A;  . NASAL SEPTUM SURGERY     For deviated septum  . SMALL INTESTINE SURGERY     Adhesions    Prior to Admission medications   Medication Sig Start Date  End Date Taking? Authorizing Provider  albuterol (PROVENTIL) (2.5 MG/3ML) 0.083% nebulizer solution Take 2.5 mg by nebulization every 4 (four) hours as needed for wheezing.  07/13/19  Yes [provider]  amLODipine (NORVASC) 10 MG tablet Take 1 tablet (10 mg total) by mouth daily. 04/26/20  Yes Malfi, Lupita Raider, FNP  Dulaglutide (TRULICITY) 4.5 DJ/2.4QA SOPN Inject 4.5 mg into the skin every 7 (seven) days.  07/06/17  Yes [provider]  fluticasone (FLONASE) 50 MCG/ACT nasal spray SHAKE LIQUID AND USE 2 SPRAYS IN EACH NOSTRIL DAILY Patient taking differently: Place 1 spray into both nostrils daily.  01/17/20  Yes Karamalegos, Devonne Doughty, DO  glucose blood (ONE TOUCH ULTRA TEST) test strip USE TO TEST THREE TIMES DAILY 07/26/18  Yes [name], NP  Insulin Glargine [facility] KWIKPEN) 100 UNIT/ML Inject into the skin. 04/30/20 04/30/21 Yes [provider]  Insulin Pen Needle (BD PEN NEEDLE NANO 2ND GEN) 32G X 4 MM MISC Use 1 each once daily 04/30/20  Yes [provider]  Lancets 28G MISC 3 (three) times daily. Use as instructed. DX: E11.9 11/02/16  Yes [provider]  lisinopril (ZESTRIL) 20 MG tablet Take 1.5 tablets (30 mg total) by mouth daily. 05/10/20  Yes Malfi, Lupita Raider, FNP  CLENPIQ 10-3.5-12 MG-GM -GM/160ML SOLN SMARTSIG:320 Milliliter(s) By Mouth As Directed Patient not taking: Reported on 05/10/2020 05/07/20   [provider]  Naltrexone-buPROPion HCl ER (CONTRAVE) 8-90 MG TB12 Start 1  tablet every morning for 7 days, then 1 tablet twice daily for 7 days, then 2 tablets every morning and one every evening Patient not taking: Reported on 05/10/2020 05/01/20   Verl Bangs, FNP    Allergies as of 05/06/2020 - Review Complete 05/06/2020  Allergen Reaction Noted  . Adhesive [tape]  03/20/2020  . Latex  03/20/2020  . Sulfa antibiotics Rash 07/27/2018    Family History  Problem Relation Age of Onset  . Early death Mother        MVA  - died age 29  . Alcohol abuse Father   . Hypertension Father   . Cancer Father 21       renal cell carcinoma  . Heart disease Father 36       MI - died  . Diabetes Brother   . Sleep apnea Brother   . Sleep apnea Daughter   . Sleep apnea Brother   . Diabetes Maternal Uncle   . Diabetes Paternal Aunt   . Kidney disease Maternal Grandmother     Social History   Socioeconomic History  . Marital status: Married    Spouse name: Robin Searing  . Number of children: 1  . Years of education: 76  . Highest education level: Master's degree (e.g., MA, MS, MEng, MEd, MSW, MBA)  Occupational History  . Occupation: Scientist, physiological of Orthoptist    Comment: Media planner  Tobacco Use  . Smoking status: Never Smoker  . Smokeless tobacco: Never Used  Vaping Use  . Vaping Use: Never used  Substance and Sexual Activity  . Alcohol use: Yes    Alcohol/week: 0.0 standard drinks    Comment: Occasional  . Drug use: No  . Sexual activity: Yes    Partners: Female  Other Topics Concern  . Not on file  Social History Narrative   Mr. Shough grew up in [place], [place], [place]. He currently is living in [place], [place] with his wife of 2 months. He was married previously to his second wife for 29 years. Previous to that, he was married to his first wife for 7 years and has 1 daughter from this marriage (1 y/o). He widowed in 2013 (second wife). 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. He obtained a Oceanographer in Scientist, physiological, [place] from [employer]. 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 He enjoys baseball. Enjoys the outdoors. He also enjoys watching old movies. 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   Social Determinants of Health   Financial Resource Strain:   . Difficulty of Paying Living Expenses: Not on file  Food Insecurity:     . Worried About Charity fundraiser in the Last Year: Not on file  . Ran Out of Food in the Last Year: Not on file  Transportation Needs:   . Lack of Transportation (Medical): Not on file  . Lack of Transportation (Non-Medical): Not on file  Physical Activity:   . Days of Exercise per Week: Not on file  . Minutes of Exercise per Session: Not on file  Stress:   . Feeling of Stress : Not on file  Social Connections:   . Frequency of Communication with Friends and Family: Not on file  . Frequency of Social Gatherings with Friends and Family: Not on file  . Attends Religious Services: Not on file  .  Active Member of Clubs or Organizations: Not on file  . Attends Archivist Meetings: Not on file  . Marital Status: Not on file  Intimate Partner Violence:   . Fear of Current or Ex-Partner: Not on file  . Emotionally Abused: Not on file  . Physically Abused: Not on file  . Sexually Abused: Not on file    Review of Systems: See HPI, otherwise negative ROS  Physical Exam: BP (!) 154/86   Pulse 94   Temp (!) 97.3 F (36.3 C) (Temporal)   Resp 18   Ht 5\' 7"  (1.702 m)   Wt 94.3 kg   SpO2 98%   BMI 32.58 kg/m  General:   Alert,  pleasant and cooperative in NAD Head:  Normocephalic and atraumatic. Neck:  Supple; no masses or thyromegaly. Lungs:  Clear throughout to auscultation, normal respiratory effort.    Heart:  +S1, +S2, Regular rate and rhythm, No edema. Abdomen:  Soft, nontender and nondistended. Normal bowel sounds, without guarding, and without rebound.   Neurologic:  Alert and  oriented x4;  grossly normal neurologically.  Impression/Plan: Turner Baillie is here for a colonoscopy to be performed for average risk screening.  Risks, benefits, limitations, and alternatives regarding  colonoscopy have been reviewed with the patient.  Questions have been answered.  All parties agreeable.   Virgel Manifold, MD  05/16/2020, 9:12 AM

## 2020-05-17 ENCOUNTER — Encounter: Payer: Self-pay | Admitting: Gastroenterology

## 2020-05-17 LAB — SURGICAL PATHOLOGY

## 2020-06-05 ENCOUNTER — Telehealth (INDEPENDENT_AMBULATORY_CARE_PROVIDER_SITE_OTHER): Payer: BC Managed Care – PPO | Admitting: Family Medicine

## 2020-06-05 ENCOUNTER — Other Ambulatory Visit: Payer: Self-pay

## 2020-06-05 ENCOUNTER — Encounter: Payer: Self-pay | Admitting: Family Medicine

## 2020-06-05 VITALS — BP 149/81 | HR 97

## 2020-06-05 DIAGNOSIS — B349 Viral infection, unspecified: Secondary | ICD-10-CM | POA: Insufficient documentation

## 2020-06-05 DIAGNOSIS — R059 Cough, unspecified: Secondary | ICD-10-CM | POA: Insufficient documentation

## 2020-06-05 MED ORDER — PREDNISONE 50 MG PO TABS: ORAL_TABLET | ORAL | 0 refills | Status: DC

## 2020-06-05 MED ORDER — GUAIFENESIN ER 600 MG PO TB12: 600.0000 mg | ORAL_TABLET | Freq: Two times a day (BID) | ORAL | 0 refills | Status: AC

## 2020-06-05 MED ORDER — GUAIFENESIN-CODEINE 100-10 MG/5ML PO SOLN: 5.0000 mL | Freq: Three times a day (TID) | ORAL | 0 refills | Status: DC | PRN

## 2020-06-05 NOTE — Assessment & Plan Note (Signed)
Likely COVID-19 based on symptoms.  Has been a cold that has been going around at work but has been reported to not be COVID.  Will treat symptoms with prednisone 50mg , robitussin AC and mucinex.  Patient can take OTC coriciden HBP for additional cold symptoms.  To increase fluids, rest, and quarantine until results of COVID test is received.  Plan: 1. Begin prednisone 50mg , 1 tablet daily x 5 days 2. Begin Mucinex 600mg  BID x 10 days 3. Can take robitussin AC 6mL every 8 hours as needed for cough.  To not drive or operate heavy machinery while using this medication, as can cause sedation/drowsiness 4. Have COVID testing completed 5. Strict ER precautions 6. RTC PRN

## 2020-06-05 NOTE — Assessment & Plan Note (Addendum)
Likely viral infection with sore throat, chest congestion, body aches, productive cough with phlegm, fatigue, sternal chest discomfort with coughing and chills.  Encouraged COVID testing.  Discussed option for antibody infusion if COVID testing is positive (rapid or done through the lab).  Patient will schedule testing for today.

## 2020-06-05 NOTE — Patient Instructions (Signed)
Have COVID testing completed.  As discussed, if your COVID testing is positive, please let us know and we can send in a referral for the antibody infusion clinic to contact you.  I have sent in a few prescriptions: 1) Begin prednisone 50mg , 1 tablet daily x 5 days 2) Begin mucinex 600mg  2x daily x 10 days 3) Can take Robitussin AC 74mL every 8 hours as needed for cough.  Be sure to not drive or operate heavy machinery while taking this medication as it can cause sedation  Be sure to increase your fluids  Can take acetaminophen, according to packaging directions.  AVOID NSAIDs (ibuprofen, naproxen, advil, aleve, Goody's, BC powder) while taking the prednisone.  If taking prednisone and NSAIDs together can cause stomach upset.  If you begin to have worsening shortness of breath, chest pain, fever over 104 that is not responsive to ibuprofen and/or acetaminophen, or impending sense of doom to [place]!  We will plan to see you back if your symptoms worsen or fail to improve  You will receive a survey after today's visit either digitally by e-mail or paper by USPS mail. Your experiences and feedback matter to [name].  Please respond so we know how we are doing as we provide care for you.  Call us with any questions/concerns/needs.  It is my goal to be available to you for your health concerns.  Thanks for choosing me to be a partner in your healthcare needs!  Harlin Rain, FNP-C Family Nurse Practitioner [facility] Phone: [phone]

## 2020-06-05 NOTE — Progress Notes (Signed)
Virtual Visit via [place] Virtual Visit  The purpose of this virtual visit is to provide medical care while limiting exposure to the novel coronavirus (COVID19) for both patient and office staff.  Consent was obtained for phone visit:  Yes.   Answered questions that patient had about telehealth interaction:  Yes.   I discussed the limitations, risks, security and privacy concerns of performing an evaluation and management service by telephone. I also discussed with the patient that there may be a patient responsible charge related to this service. The patient expressed understanding and agreed to proceed.  Patient is at home and is accessed via [place] are provided by Harlin Rain, FNP-C from [facility])  ---------------------------------------------------------------------- Chief Complaint  Patient presents with  . Sore Throat    sore throat, but denies difficulty swallowing.  Complains of chest congestion, body aches, productive cough w/ greenish phlegm, fatigue and  chills x 1 day     S: Reviewed CMA documentation. I have called patient and gathered additional HPI as follows:  Luke Coleman presents to clinic for concerns of sore throat with chest congestion, body aches, productive cough with greenish phlegm, fatigue, sternal chest discomfort with coughing and chills x 1 day.  Has been exposed to coworkers that have had colds but reports was told is not COVID.  Has taken Tylenol cold and sinus without improvement in his symptoms.  Denies known fevers, sweats, shortness of breath, DOE, headache, abdominal pain, n/v/d.  Patient is currently home Denies any high risk travel to areas of current concern for COVID19. Denies any known or suspected exposure to person with or possibly with COVID19.  Past Medical History:  Diagnosis Date  . Acute bacterial sinusitis 08/22/2014  . COPD (chronic obstructive pulmonary disease) ([place])   . Diabetes  mellitus without complication ([place])   . Enlarged prostate   . Essential (primary) hypertension 03/01/2013   Overview:  Last Assessment & Plan:  Taking meds as prescribed. Check bmet. Continue to follow.   Marland Kitchen HTN (hypertension) 03/01/2013  . Hypertension   . Sleep apnea    on CPAP, USUALLY...CURRENTLY BROKEN   Social History   Tobacco Use  . Smoking status: Never Smoker  . Smokeless tobacco: Never Used  Vaping Use  . Vaping Use: Never used  Substance Use Topics  . Alcohol use: Yes    Alcohol/week: 0.0 standard drinks    Comment: Occasional  . Drug use: No    Current Outpatient Medications:  .  albuterol (PROVENTIL) (2.5 MG/3ML) 0.083% nebulizer solution, Take 2.5 mg by nebulization every 4 (four) hours as needed for wheezing. , Disp: , Rfl:  .  amLODipine (NORVASC) 10 MG tablet, Take 1 tablet (10 mg total) by mouth daily., Disp: 90 tablet, Rfl: 3 .  Dulaglutide (TRULICITY) 4.5 JO/8.7OM SOPN, Inject 4.5 mg into the skin every 7 (seven) days. , Disp: , Rfl:  .  fluticasone (FLONASE) 50 MCG/ACT nasal spray, SHAKE LIQUID AND USE 2 SPRAYS IN EACH NOSTRIL DAILY (Patient taking differently: Place 1 spray into both nostrils daily. ), Disp: 16 g, Rfl: 0 .  Insulin Glargine (BASAGLAR KWIKPEN) 100 UNIT/ML, Inject 50 Units into the skin daily. , Disp: , Rfl:  .  Insulin Pen Needle (BD PEN NEEDLE NANO 2ND GEN) 32G X 4 MM MISC, Use 1 each once daily, Disp: , Rfl:  .  Lancets 28G MISC, 3 (three) times daily. Use as instructed. DX: E11.9, Disp: , Rfl:  .  lisinopril (  ZESTRIL) 20 MG tablet, Take 1.5 tablets (30 mg total) by mouth daily. (Patient taking differently: Take 40 mg by mouth daily. ), Disp: 90 tablet, Rfl: 0 .  Naltrexone-buPROPion HCl ER (CONTRAVE) 8-90 MG TB12, Start 1 tablet every morning for 7 days, then 1 tablet twice daily for 7 days, then 2 tablets every morning and one every evening, Disp: 120 tablet, Rfl: 0 .  CLENPIQ 10-3.5-12 MG-GM -GM/160ML SOLN, SMARTSIG:320 Milliliter(s) By Mouth As  Directed (Patient not taking: Reported on 05/10/2020), Disp: , Rfl:  .  glucose blood (ONE TOUCH ULTRA TEST) test strip, USE TO TEST THREE TIMES DAILY (Patient not taking: Reported on 06/05/2020), Disp: 100 each, Rfl: 11 .  guaiFENesin (MUCINEX) 600 MG 12 hr tablet, Take 1 tablet (600 mg total) by mouth 2 (two) times daily for 10 days., Disp: 20 tablet, Rfl: 0 .  guaiFENesin-codeine 100-10 MG/5ML syrup, Take 5 mLs by mouth 3 (three) times daily as needed for cough., Disp: 120 mL, Rfl: 0 .  predniSONE (DELTASONE) 50 MG tablet, Take 1 tablet daily x 5 days, Disp: 5 tablet, Rfl: 0  Depression screen [facility] 2/9 12/18/2019 07/28/2018 11/05/2017  Decreased Interest 0 0 0  Down, Depressed, Hopeless 0 0 0  PHQ - 2 Score 0 0 0    No flowsheet data found.  -------------------------------------------------------------------------- O: No physical exam performed due to remote telephone encounter.  Physical Exam: Patient remotely monitored with video.  Verbal communication appropriate.  Cognition normal.  Recent Results (from the past 2160 hour(s))  Urinalysis, Complete     Status: Abnormal   Collection Time: 03/12/20  3:48 PM  Result Value Ref Range   Specific Gravity, UA 1.020 1.005 - 1.030   pH, UA 5.0 5.0 - 7.5   Color, UA Yellow Yellow   Appearance Ur Clear Clear   Leukocytes,UA Negative Negative   Protein,UA Negative Negative/Trace   Glucose, UA 2+ (A) Negative   Ketones, UA Trace (A) Negative   RBC, UA Negative Negative   Bilirubin, UA Negative Negative   Urobilinogen, Ur 0.2 0.2 - 1.0 mg/dL   Nitrite, UA Negative Negative   Microscopic Examination See below:   Microscopic Examination     Status: None   Collection Time: 03/12/20  3:48 PM   Urine  Result Value Ref Range   WBC, UA 0-5 0 - 5 /hpf   RBC 0-2 0 - 2 /hpf   Epithelial Cells (non renal) None seen 0 - 10 /hpf   Bacteria, UA None seen None seen/Few  CULTURE, URINE COMPREHENSIVE     Status: None   Collection Time: 03/12/20  3:59  PM   Specimen: Urine   UR  Result Value Ref Range   Urine Culture, Comprehensive Final report    Organism ID, Bacteria Comment     Comment: Mixed urogenital flora 5,000  Colonies/mL   Basic metabolic panel per protocol     Status: Abnormal   Collection Time: 03/21/20  9:17 AM  Result Value Ref Range   Sodium 135 135 - 145 mmol/L   Potassium 3.8 3.5 - 5.1 mmol/L   Chloride 101 98 - 111 mmol/L   CO2 22 22 - 32 mmol/L   Glucose, Bld 263 (H) 70 - 99 mg/dL    Comment: Glucose reference range applies only to samples taken after fasting for at least 8 hours.   BUN 28 (H) 8 - 23 mg/dL   Creatinine, Ser 1.66 (H) 0.61 - 1.24 mg/dL   Calcium 9.0 8.9 -  10.3 mg/dL   GFR calc non Af Amer 42 (L) >60 mL/min   GFR calc Af Amer 48 (L) >60 mL/min   Anion gap 12 5 - 15    Comment: Performed at [facility], [address] [place], [place] [address]  CBC per protocol     Status: Abnormal   Collection Time: 03/21/20  9:17 AM  Result Value Ref Range   WBC 7.1 4.0 - 10.5 K/uL   RBC 4.86 4.22 - 5.81 MIL/uL   Hemoglobin 16.1 13.0 - 17.0 g/dL   HCT 43.5 39 - 52 %   MCV 89.5 80.0 - 100.0 fL   MCH 33.1 26.0 - 34.0 pg   MCHC 37.0 (H) 30.0 - 36.0 g/dL   RDW 12.9 11.5 - 15.5 %   Platelets 180 150 - 400 K/uL   nRBC 0.0 0.0 - 0.2 %    Comment: Performed at [facility], [address] [place], [place] [address]  SARS CORONAVIRUS 2 (TAT 6-24 HRS) Nasopharyngeal Nasopharyngeal Swab     Status: None   Collection Time: 03/21/20  9:43 AM   Specimen: Nasopharyngeal Swab  Result Value Ref Range   SARS Coronavirus 2 NEGATIVE NEGATIVE    Comment: (NOTE) SARS-CoV-2 target nucleic acids are NOT DETECTED.  The SARS-CoV-2 RNA is generally detectable in upper and lower respiratory specimens during the acute phase of infection. Negative results do not preclude SARS-CoV-2 infection, do not rule out co-infections with other pathogens, and should not be used as the sole basis for treatment or  other patient management decisions. Negative results must be combined with clinical observations, patient history, and epidemiological information. The expected result is Negative.  Fact Sheet for Patients: SugarRoll.be  Fact Sheet for Healthcare Providers: https://www.woods-mathews.com/  This test is not yet approved or cleared by the [place] FDA and  has been authorized for detection and/or diagnosis of SARS-CoV-2 by FDA under an Emergency Use Authorization (EUA). This EUA will remain  in effect (meaning this test can be used) for the duration of the COVID-19 declaration under Se ction 564(b)(1) of the Act, 21 U.S.C. section 360bbb-3(b)(1), unless the authorization is terminated or revoked sooner.  Performed at [facility], [place] [address] [place], [place] [address]   Glucose, capillary     Status: Abnormal   Collection Time: 03/25/20  6:26 AM  Result Value Ref Range   Glucose-Capillary 210 (H) 70 - 99 mg/dL    Comment: Glucose reference range applies only to samples taken after fasting for at least 8 hours.  Surgical pathology     Status: None   Collection Time: 03/25/20  7:58 AM  Result Value Ref Range   SURGICAL PATHOLOGY      SURGICAL PATHOLOGY CASE: [id number] PATIENT: Luke Coleman Surgical Pathology Report     Specimen Submitted: A. Bladder trigone, left; bx  Clinical History: Overactive bladder, urge incontinence      DIAGNOSIS: A. BLADDER TRIGONE, LEFT; BIOPSY: - BENIGN UROTHELIAL MUCOSA WITH MILD REACTIVE CHANGES. - NEGATIVE FOR ATYPIA AND MALIGNANCY.   GROSS DESCRIPTION: A. Labeled: Left trigone bladder biopsy Received: Formalin Tissue fragment(s): 1 Size: 0.4 cm Description: Tan-pink soft tissue fragment Entirely submitted in 1 cassette.     Final Diagnosis performed by Betsy Pries, MD.   Electronically signed 03/26/2020 3:00:29PM The electronic signature indicates that the named  Attending Pathologist has evaluated the specimen Technical component performed at [facility], [address] [place], [place] [address] Lab: [phone] Dir: Rush Farmer, MD, MMM  Professional component performed at [facility], [facility], [place], [place], [place] [address] Lab: [phone] Dir: Dellia Nims. Rubinas, MD   Glucose, capillary     Status: Abnormal   Collection Time: 03/25/20  8:12 AM  Result Value Ref Range   Glucose-Capillary 176 (H) 70 - 99 mg/dL    Comment: Glucose reference range applies only to samples taken after fasting for at least 8 hours.  Bladder Scan (Post Void Residual) in office     Status: None   Collection Time: 04/08/20  9:17 AM  Result Value Ref Range   Scan Result 28   SARS CORONAVIRUS 2 (TAT 6-24 HRS) Nasopharyngeal Nasopharyngeal Swab     Status: None   Collection Time: 05/14/20  9:23 AM   Specimen: Nasopharyngeal Swab  Result Value Ref Range   SARS Coronavirus 2 NEGATIVE NEGATIVE    Comment: (NOTE) SARS-CoV-2 target nucleic acids are NOT DETECTED.  The SARS-CoV-2 RNA is generally detectable in upper and lower respiratory specimens during the acute phase of infection. Negative results do not preclude SARS-CoV-2 infection, do not rule out co-infections with other pathogens, and should not be used as the sole basis for treatment or other patient management decisions. Negative results must be combined with clinical observations, patient history, and epidemiological information. The expected result is Negative.  Fact Sheet for Patients: SugarRoll.be  Fact Sheet for Healthcare Providers: https://www.woods-mathews.com/  This test is not yet approved or cleared by the [place] FDA and  has been authorized for detection and/or diagnosis of SARS-CoV-2 by FDA under an Emergency Use Authorization (EUA). This EUA will remain  in effect (meaning this test can be used) for the duration  of the COVID-19 declaration under Se ction 564(b)(1) of the Act, 21 U.S.C. section 360bbb-3(b)(1), unless the authorization is terminated or revoked sooner.  Performed at [facility], [place] [address] [place], [place] [address]   Glucose, capillary     Status: Abnormal   Collection Time: 05/16/20  8:42 AM  Result Value Ref Range   Glucose-Capillary 126 (H) 70 - 99 mg/dL    Comment: Glucose reference range applies only to samples taken after fasting for at least 8 hours.  Surgical pathology     Status: None   Collection Time: 05/16/20  9:26 AM  Result Value Ref Range   SURGICAL PATHOLOGY      SURGICAL PATHOLOGY CASE: [id number] PATIENT: Luke Coleman Surgical Pathology Report     Specimen Submitted: A. Colon polyp x6, cecum/asc/trans; cbx  cs B. Colon, transverse; cbx C. Colon polyp, sigmoid; cold snare  Clinical History: Screening colonoscopy.  Colon polyp, diverticulosis.      DIAGNOSIS: A. COLON POLYP X6, CECUM/ASCENDING/TRANSVERSE; COLD BIOPSY AND COLD SNARE: - TUBULAR ADENOMA, MULTIPLE FRAGMENTS. - NEGATIVE FOR HIGH-GRADE DYSPLASIA AND MALIGNANCY.  B. COLON, TRANSVERSE; COLD BIOPSY: - BENIGN COLONIC MUCOSA WITH SUBMUCOSAL ADIPOSE TISSUE. - NEGATIVE FOR DYSPLASIA AND MALIGNANCY.  Comment: In the proper clinical context, the findings would be compatible with submucosa lipoma.  C. COLON POLYP, SIGMOID; COLD SNARE: - TUBULAR ADENOMA. - NEGATIVE FOR HIGH-GRADE DYSPLASIA AND MALIGNANCY.   GROSS DESCRIPTION: A. Labeled: cbx cecal polyp; cold snare polyp ascending colon x2; cold snare transverse colon; cbx transverse colon x2 Receive d: Formalin Tissue fragment(s): Multiple Size: Aggregate, 1.7 x 0.6 x 0.3 cm Description: Tan soft tissue fragments Entirely submitted in 1 cassette.  B. Labeled: cbx thickened fold versus lipoma transverse colon Received: Formalin Tissue fragment(s): 1 Size: 0.4 cm Description:  Tan soft tissue fragment Entirely  submitted in 1 cassette.  C. Labeled: Cold snare polyp sigmoid colon Received: Formalin Tissue fragment(s): Multiple Size: Aggregate, 1.4 x 0.9 x 0.2 cm Description: Tan soft tissue fragments Entirely submitted in 1 cassette.    Final Diagnosis performed by Betsy Pries, MD.   Electronically signed 05/17/2020 9:57:13AM The electronic signature indicates that the named Attending Pathologist has evaluated the specimen Technical component performed at [facility], [address] [place], [place] [address] Lab: [phone] Dir: Rush Farmer, MD, MMM  Professional component performed at [facility], [facility], [place], [place], [place] [address] Lab: [phone] Dir: Dellia Nims. Reuel Derby, MD     -------------------------------------------------------------------------- A&P:  Problem List Items Addressed This Visit      Other   Cough - Primary    Likely COVID-19 based on symptoms.  Has been a cold that has been going around at work but has been reported to not be COVID.  Will treat symptoms with prednisone 50mg , robitussin AC and mucinex.  Patient can take OTC coriciden HBP for additional cold symptoms.  To increase fluids, rest, and quarantine until results of COVID test is received.  Plan: 1. Begin prednisone 50mg , 1 tablet daily x 5 days 2. Begin Mucinex 600mg  BID x 10 days 3. Can take robitussin AC 85mL every 8 hours as needed for cough.  To not drive or operate heavy machinery while using this medication, as can cause sedation/drowsiness 4. Have COVID testing completed 5. Strict ER precautions 6. RTC PRN      Relevant Medications   predniSONE (DELTASONE) 50 MG tablet   guaiFENesin (MUCINEX) 600 MG 12 hr tablet   guaiFENesin-codeine 100-10 MG/5ML syrup   Viral infection    Likely viral infection with sore throat, chest congestion, body aches, productive cough with phlegm, fatigue, sternal chest discomfort with coughing and chills.  Encouraged COVID  testing.  Discussed option for antibody infusion if COVID testing is positive (rapid or done through the lab).  Patient will schedule testing for today.      Relevant Medications   guaiFENesin (MUCINEX) 600 MG 12 hr tablet      Meds ordered this encounter  Medications  . predniSONE (DELTASONE) 50 MG tablet    Sig: Take 1 tablet daily x 5 days    Dispense:  5 tablet    Refill:  0  . guaiFENesin (MUCINEX) 600 MG 12 hr tablet    Sig: Take 1 tablet (600 mg total) by mouth 2 (two) times daily for 10 days.    Dispense:  20 tablet    Refill:  0  . guaiFENesin-codeine 100-10 MG/5ML syrup    Sig: Take 5 mLs by mouth 3 (three) times daily as needed for cough.    Dispense:  120 mL    Refill:  0    Follow-up: - Return if symptoms worsen or fail to improve  Patient verbalizes understanding with the above medical recommendations including the limitation of remote medical advice.  Specific follow-up and call-back criteria were given for patient to follow-up or seek medical care more urgently if needed.  - Time spent in direct consultation with patient on video: 9 minutes  Harlin Rain, [facility] 06/05/2020, 10:15 AM

## 2020-06-13 ENCOUNTER — Encounter: Payer: Self-pay | Admitting: Family Medicine

## 2020-06-14 ENCOUNTER — Other Ambulatory Visit: Payer: Self-pay | Admitting: Family Medicine

## 2020-06-14 DIAGNOSIS — R059 Cough, unspecified: Secondary | ICD-10-CM

## 2020-06-14 MED ORDER — PROMETHAZINE-DM 6.25-15 MG/5ML PO SYRP: 5.0000 mL | ORAL_SOLUTION | Freq: Four times a day (QID) | ORAL | 0 refills | Status: DC | PRN

## 2020-06-17 ENCOUNTER — Encounter: Payer: Self-pay | Admitting: Family Medicine

## 2020-06-17 ENCOUNTER — Other Ambulatory Visit: Payer: Self-pay

## 2020-06-17 ENCOUNTER — Ambulatory Visit (INDEPENDENT_AMBULATORY_CARE_PROVIDER_SITE_OTHER): Payer: BC Managed Care – PPO | Admitting: Family Medicine

## 2020-06-17 DIAGNOSIS — J329 Chronic sinusitis, unspecified: Secondary | ICD-10-CM

## 2020-06-17 DIAGNOSIS — R059 Cough, unspecified: Secondary | ICD-10-CM

## 2020-06-17 MED ORDER — AMOXICILLIN-POT CLAVULANATE 875-125 MG PO TABS: 1.0000 | ORAL_TABLET | Freq: Two times a day (BID) | ORAL | 0 refills | Status: DC

## 2020-06-17 MED ORDER — PROMETHAZINE-DM 6.25-15 MG/5ML PO SYRP: 5.0000 mL | ORAL_SOLUTION | Freq: Four times a day (QID) | ORAL | 0 refills | Status: DC | PRN

## 2020-06-17 NOTE — Progress Notes (Signed)
Virtual Visit via Telephone  The purpose of this virtual visit is to provide medical care while limiting exposure to the novel coronavirus (COVID19) for both patient and office staff.  Consent was obtained for phone visit:  Yes.   Answered questions that patient had about telehealth interaction:  Yes.   I discussed the limitations, risks, security and privacy concerns of performing an evaluation and management service by telephone. I also discussed with the patient that there may be a patient responsible charge related to this service. The patient expressed understanding and agreed to proceed.  Patient is at home and is accessed via telephone Services are provided by Harlin Rain, FNP-C from [facility])  ---------------------------------------------------------------------- Chief Complaint  Patient presents with  . Cough    productive cough w/ greenish mucus, head congestion, nauseated, chills, nasal congestion, runny nose, post nasal drainage, Rt lower back pain and bilateral ear fullness. x 2 weeks     S: Reviewed CMA documentation. I have called patient and gathered additional HPI as follows:  Mr. Villavicencio presents for virtual telemedicine visit via phone.  Reports he has been having a productive cough with greenish mucus, head congestion, with nausea, chills, nasal congestion, rhinorrhea, post nasal drainage, right lower back pain and bilateral ear fullness x 2 weeks.  He has been taking the mucinex and promethazine DM with some alleviation of his symptoms, but has not been able to achieve full symptom resolution.  Denies fevers, body aches, shortness of breath, abdominal pain, n/v/d.  Patient is currently home Denies any high risk travel to areas of current concern for COVID19. Denies any known or suspected exposure to person with or possibly with COVID19.  Past Medical History:  Diagnosis Date  . Acute bacterial sinusitis 08/22/2014  . COPD (chronic  obstructive pulmonary disease) ([place])   . Diabetes mellitus without complication ([place])   . Enlarged prostate   . Essential (primary) hypertension 03/01/2013   Overview:  Last Assessment & Plan:  Taking meds as prescribed. Check bmet. Continue to follow.   Marland Kitchen HTN (hypertension) 03/01/2013  . Hypertension   . Sleep apnea    on CPAP, USUALLY...CURRENTLY BROKEN   Social History   Tobacco Use  . Smoking status: Never Smoker  . Smokeless tobacco: Never Used  Vaping Use  . Vaping Use: Never used  Substance Use Topics  . Alcohol use: Yes    Alcohol/week: 0.0 standard drinks    Comment: Occasional  . Drug use: No    Current Outpatient Medications:  .  albuterol (PROVENTIL) (2.5 MG/3ML) 0.083% nebulizer solution, Take 2.5 mg by nebulization every 4 (four) hours as needed for wheezing. , Disp: , Rfl:  .  amLODipine (NORVASC) 10 MG tablet, Take 1 tablet (10 mg total) by mouth daily. (Patient taking differently: Take 20 mg by mouth daily. ), Disp: 90 tablet, Rfl: 3 .  Dulaglutide (TRULICITY) 4.5 GE/9.5MW SOPN, Inject 4.5 mg into the skin every 7 (seven) days. , Disp: , Rfl:  .  fluticasone (FLONASE) 50 MCG/ACT nasal spray, SHAKE LIQUID AND USE 2 SPRAYS IN EACH NOSTRIL DAILY (Patient taking differently: Place 1 spray into both nostrils daily. ), Disp: 16 g, Rfl: 0 .  glucose blood (ONE TOUCH ULTRA TEST) test strip, USE TO TEST THREE TIMES DAILY, Disp: 100 each, Rfl: 11 .  Insulin Glargine (BASAGLAR KWIKPEN) 100 UNIT/ML, Inject 50 Units into the skin daily. , Disp: , Rfl:  .  Insulin Pen Needle (BD PEN NEEDLE NANO 2ND GEN)  32G X 4 MM MISC, Use 1 each once daily, Disp: , Rfl:  .  Lancets 28G MISC, 3 (three) times daily. Use as instructed. DX: E11.9, Disp: , Rfl:  .  lisinopril (ZESTRIL) 20 MG tablet, Take 1.5 tablets (30 mg total) by mouth daily. (Patient taking differently: Take 40 mg by mouth daily. ), Disp: 90 tablet, Rfl: 0 .  promethazine-dextromethorphan (PROMETHAZINE-DM) 6.25-15 MG/5ML syrup, Take  5 mLs by mouth 4 (four) times daily as needed for cough., Disp: 118 mL, Rfl: 0 .  amoxicillin-clavulanate (AUGMENTIN) 875-125 MG tablet, Take 1 tablet by mouth 2 (two) times daily., Disp: 20 tablet, Rfl: 0 .  CLENPIQ 10-3.5-12 MG-GM -GM/160ML SOLN, SMARTSIG:320 Milliliter(s) By Mouth As Directed (Patient not taking: Reported on 05/10/2020), Disp: , Rfl:  .  Naltrexone-buPROPion HCl ER (CONTRAVE) 8-90 MG TB12, Start 1 tablet every morning for 7 days, then 1 tablet twice daily for 7 days, then 2 tablets every morning and one every evening (Patient not taking: Reported on 06/17/2020), Disp: 120 tablet, Rfl: 0  Depression screen [facility] 2/9 12/18/2019 07/28/2018 11/05/2017  Decreased Interest 0 0 0  Down, Depressed, Hopeless 0 0 0  PHQ - 2 Score 0 0 0    No flowsheet data found.  -------------------------------------------------------------------------- O: No physical exam performed due to remote telephone encounter.  Physical Exam: Patient remotely monitored without video.  Verbal communication appropriate.  Cognition normal.  Recent Results (from the past 2160 hour(s))  Basic metabolic panel per protocol     Status: Abnormal   Collection Time: 03/21/20  9:17 AM  Result Value Ref Range   Sodium 135 135 - 145 mmol/L   Potassium 3.8 3.5 - 5.1 mmol/L   Chloride 101 98 - 111 mmol/L   CO2 22 22 - 32 mmol/L   Glucose, Bld 263 (H) 70 - 99 mg/dL    Comment: Glucose reference range applies only to samples taken after fasting for at least 8 hours.   BUN 28 (H) 8 - 23 mg/dL   Creatinine, Ser 1.66 (H) 0.61 - 1.24 mg/dL   Calcium 9.0 8.9 - 10.3 mg/dL   GFR calc non Af Amer 42 (L) >60 mL/min   GFR calc Af Amer 48 (L) >60 mL/min   Anion gap 12 5 - 15    Comment: Performed at [facility], [address] [place], [place] [address]  CBC per protocol     Status: Abnormal   Collection Time: 03/21/20  9:17 AM  Result Value Ref Range   WBC 7.1 4.0 - 10.5 K/uL   RBC 4.86 4.22 - 5.81 MIL/uL    Hemoglobin 16.1 13.0 - 17.0 g/dL   HCT 43.5 39 - 52 %   MCV 89.5 80.0 - 100.0 fL   MCH 33.1 26.0 - 34.0 pg   MCHC 37.0 (H) 30.0 - 36.0 g/dL   RDW 12.9 11.5 - 15.5 %   Platelets 180 150 - 400 K/uL   nRBC 0.0 0.0 - 0.2 %    Comment: Performed at [facility], [address] [place], [place] [address]  SARS CORONAVIRUS 2 (TAT 6-24 HRS) Nasopharyngeal Nasopharyngeal Swab     Status: None   Collection Time: 03/21/20  9:43 AM   Specimen: Nasopharyngeal Swab  Result Value Ref Range   SARS Coronavirus 2 NEGATIVE NEGATIVE    Comment: (NOTE) SARS-CoV-2 target nucleic acids are NOT DETECTED.  The SARS-CoV-2 RNA is generally detectable in upper and lower respiratory specimens during the acute phase of infection.  Negative results do not preclude SARS-CoV-2 infection, do not rule out co-infections with other pathogens, and should not be used as the sole basis for treatment or other patient management decisions. Negative results must be combined with clinical observations, patient history, and epidemiological information. The expected result is Negative.  Fact Sheet for Patients: SugarRoll.be  Fact Sheet for Healthcare Providers: https://www.woods-mathews.com/  This test is not yet approved or cleared by the [place] FDA and  has been authorized for detection and/or diagnosis of SARS-CoV-2 by FDA under an Emergency Use Authorization (EUA). This EUA will remain  in effect (meaning this test can be used) for the duration of the COVID-19 declaration under Se ction 564(b)(1) of the Act, 21 U.S.C. section 360bbb-3(b)(1), unless the authorization is terminated or revoked sooner.  Performed at [facility], [place] [address], [place], [place] [address]   Glucose, capillary     Status: Abnormal   Collection Time: 03/25/20  6:26 AM  Result Value Ref Range   Glucose-Capillary 210 (H) 70 - 99 mg/dL    Comment: Glucose reference range  applies only to samples taken after fasting for at least 8 hours.  Surgical pathology     Status: None   Collection Time: 03/25/20  7:58 AM  Result Value Ref Range   SURGICAL PATHOLOGY      SURGICAL PATHOLOGY CASE: [id number] PATIENT: [name] Surgical Pathology Report     Specimen Submitted: A. Bladder trigone, left; bx  Clinical History: Overactive bladder, urge incontinence      DIAGNOSIS: A. BLADDER TRIGONE, LEFT; BIOPSY: - BENIGN UROTHELIAL MUCOSA WITH MILD REACTIVE CHANGES. - NEGATIVE FOR ATYPIA AND MALIGNANCY.   GROSS DESCRIPTION: A. Labeled: Left trigone bladder biopsy Received: Formalin Tissue fragment(s): 1 Size: 0.4 cm Description: Tan-pink soft tissue fragment Entirely submitted in 1 cassette.     Final Diagnosis performed by Betsy Pries, MD.   Electronically signed 03/26/2020 3:00:29PM The electronic signature indicates that the named Attending Pathologist has evaluated the specimen Technical component performed at [facility], [address], [place], [place] [address] Lab: [phone] Dir: Rush Farmer, MD, MMM  Professional component performed at [facility], [facility], [place], [place], [place] [address] Lab: [phone] Dir: Dellia Nims. Rubinas, MD   Glucose, capillary     Status: Abnormal   Collection Time: 03/25/20  8:12 AM  Result Value Ref Range   Glucose-Capillary 176 (H) 70 - 99 mg/dL    Comment: Glucose reference range applies only to samples taken after fasting for at least 8 hours.  Bladder Scan (Post Void Residual) in office     Status: None   Collection Time: 04/08/20  9:17 AM  Result Value Ref Range   Scan Result 28   SARS CORONAVIRUS 2 (TAT 6-24 HRS) Nasopharyngeal Nasopharyngeal Swab     Status: None   Collection Time: 05/14/20  9:23 AM   Specimen: Nasopharyngeal Swab  Result Value Ref Range   SARS Coronavirus 2 NEGATIVE NEGATIVE    Comment: (NOTE) SARS-CoV-2 target nucleic acids are NOT  DETECTED.  The SARS-CoV-2 RNA is generally detectable in upper and lower respiratory specimens during the acute phase of infection. Negative results do not preclude SARS-CoV-2 infection, do not rule out co-infections with other pathogens, and should not be used as the sole basis for treatment or other patient management decisions. Negative results must be combined with clinical observations, patient history, and epidemiological information. The expected result is Negative.  Fact Sheet for Patients: SugarRoll.be  Fact Sheet  for Healthcare Providers: https://www.woods-mathews.com/  This test is not yet approved or cleared by the [place] and  has been authorized for detection and/or diagnosis of SARS-CoV-2 by FDA under an Emergency Use Authorization (EUA). This EUA will remain  in effect (meaning this test can be used) for the duration of the COVID-19 declaration under Se ction 564(b)(1) of the Act, 21 U.S.C. section 360bbb-3(b)(1), unless the authorization is terminated or revoked sooner.  Performed at [facility], [place] [address], [place], [place] [address]   Glucose, capillary     Status: Abnormal   Collection Time: 05/16/20  8:42 AM  Result Value Ref Range   Glucose-Capillary 126 (H) 70 - 99 mg/dL    Comment: Glucose reference range applies only to samples taken after fasting for at least 8 hours.  Surgical pathology     Status: None   Collection Time: 05/16/20  9:26 AM  Result Value Ref Range   SURGICAL PATHOLOGY      SURGICAL PATHOLOGY CASE: [id number] PATIENT: [name] Surgical Pathology Report     Specimen Submitted: A. Colon polyp x6, cecum/asc/trans; cbx  cs B. Colon, transverse; cbx C. Colon polyp, sigmoid; cold snare  Clinical History: Screening colonoscopy.  Colon polyp, diverticulosis.      DIAGNOSIS: A. COLON POLYP X6, CECUM/ASCENDING/TRANSVERSE; COLD BIOPSY AND COLD SNARE: - TUBULAR  ADENOMA, MULTIPLE FRAGMENTS. - NEGATIVE FOR HIGH-GRADE DYSPLASIA AND MALIGNANCY.  B. COLON, TRANSVERSE; COLD BIOPSY: - BENIGN COLONIC MUCOSA WITH SUBMUCOSAL ADIPOSE TISSUE. - NEGATIVE FOR DYSPLASIA AND MALIGNANCY.  Comment: In the proper clinical context, the findings would be compatible with submucosa lipoma.  C. COLON POLYP, SIGMOID; COLD SNARE: - TUBULAR ADENOMA. - NEGATIVE FOR HIGH-GRADE DYSPLASIA AND MALIGNANCY.   GROSS DESCRIPTION: A. Labeled: cbx cecal polyp; cold snare polyp ascending colon x2; cold snare transverse colon; cbx transverse colon x2 Receive d: Formalin Tissue fragment(s): Multiple Size: Aggregate, 1.7 x 0.6 x 0.3 cm Description: Tan soft tissue fragments Entirely submitted in 1 cassette.  B. Labeled: cbx thickened fold versus lipoma transverse colon Received: Formalin Tissue fragment(s): 1 Size: 0.4 cm Description: Tan soft tissue fragment Entirely submitted in 1 cassette.  C. Labeled: Cold snare polyp sigmoid colon Received: Formalin Tissue fragment(s): Multiple Size: Aggregate, 1.4 x 0.9 x 0.2 cm Description: Tan soft tissue fragments Entirely submitted in 1 cassette.    Final Diagnosis performed by Betsy Pries, MD.   Electronically signed 05/17/2020 9:57:13AM The electronic signature indicates that the named Attending Pathologist has evaluated the specimen Technical component performed at [facility], [address] [place], [place] [address] Lab: [phone] Dir: Rush Farmer, MD, MMM  Professional component performed at [facility], [place], [place], [place] [address] Lab: [phone] Dir: Dellia Nims. Rubinas, MD     -------------------------------------------------------------------------- A&P:  Problem List Items Addressed This Visit      Respiratory   Sinusitis - Primary    Likely bacterial sinus infection based on symptoms and length of time, will treat with Augmentin 875-125mg  BID x 10 days.   Discussed to take with food as it can be hard on the stomach, can continue the mucinex, promethazine DM and flonase as directed.  Will send in refill on Promethazine DM.  Encouraged if having any worsening of symptoms, to contact clinic and we will have a chest x-ray ordered for persistent cough.  Patient in agreement with plan.  Strict ER precautions.      Relevant Medications   amoxicillin-clavulanate (AUGMENTIN) 875-125 MG tablet  promethazine-dextromethorphan (PROMETHAZINE-DM) 6.25-15 MG/5ML syrup     Other   Cough    See sinusitis A/P      Relevant Medications   promethazine-dextromethorphan (PROMETHAZINE-DM) 6.25-15 MG/5ML syrup      Meds ordered this encounter  Medications  . amoxicillin-clavulanate (AUGMENTIN) 875-125 MG tablet    Sig: Take 1 tablet by mouth 2 (two) times daily.    Dispense:  20 tablet    Refill:  0  . promethazine-dextromethorphan (PROMETHAZINE-DM) 6.25-15 MG/5ML syrup    Sig: Take 5 mLs by mouth 4 (four) times daily as needed for cough.    Dispense:  118 mL    Refill:  0    Follow-up: - Return if symptoms worsen or fail to improve with current treatment plan  Patient verbalizes understanding with the above medical recommendations including the limitation of remote medical advice.  Specific follow-up and call-back criteria were given for patient to follow-up or seek medical care more urgently if needed.  - Time spent in direct consultation with patient on phone: 9 minutes  Harlin Rain, [facility] 06/17/2020, 4:32 PM

## 2020-06-17 NOTE — Assessment & Plan Note (Signed)
See sinusitis A/P

## 2020-06-17 NOTE — Patient Instructions (Signed)
To begin Augmentin 875-125mg  twice a day for the next 10 days.  This medication can be hard on your stomach so be sure to take this with food.  I have sent in a refill on your promethazine DM to help with nighttime cough.  Can continue the flonase and mucinex as directed to help with cough/cold and congestion symptoms  As we discussed, we can always have a chest xray completed due to persistent cough.  Our office has xray availability Monday-Thursday from 8am-5pm.  If you begin to have worsening shortness of breath, chest pain, fever over 104 that is not responsive to ibuprofen and/or acetaminophen, or impending sense of doom to [place]!  We will plan to see you back if your symptoms worsen or fail to improve  You will receive a survey after today's visit either digitally by e-mail or paper by USPS mail. Your experiences and feedback matter to [name].  Please respond so we know how we are doing as we provide care for you.  Call us with any questions/concerns/needs.  It is my goal to be available to you for your health concerns.  Thanks for choosing me to be a partner in your healthcare needs!  Harlin Rain, FNP-C Family Nurse Practitioner [facility] Phone: [phone]

## 2020-06-17 NOTE — Assessment & Plan Note (Signed)
Likely bacterial sinus infection based on symptoms and length of time, will treat with Augmentin 875-125mg  BID x 10 days.  Discussed to take with food as it can be hard on the stomach, can continue the mucinex, promethazine DM and flonase as directed.  Will send in refill on Promethazine DM.  Encouraged if having any worsening of symptoms, to contact clinic and we will have a chest x-ray ordered for persistent cough.  Patient in agreement with plan.  Strict ER precautions.

## 2020-06-18 IMAGING — DX DG CHEST 2V
2 series · 2 of 2 positions shown · non-contrast
Comparison: PA and lateral chest 09/04/2015.

CLINICAL DATA: Productive cough since 07/25/2018.

EXAM:
CHEST - 2 VIEW

[chest pa]
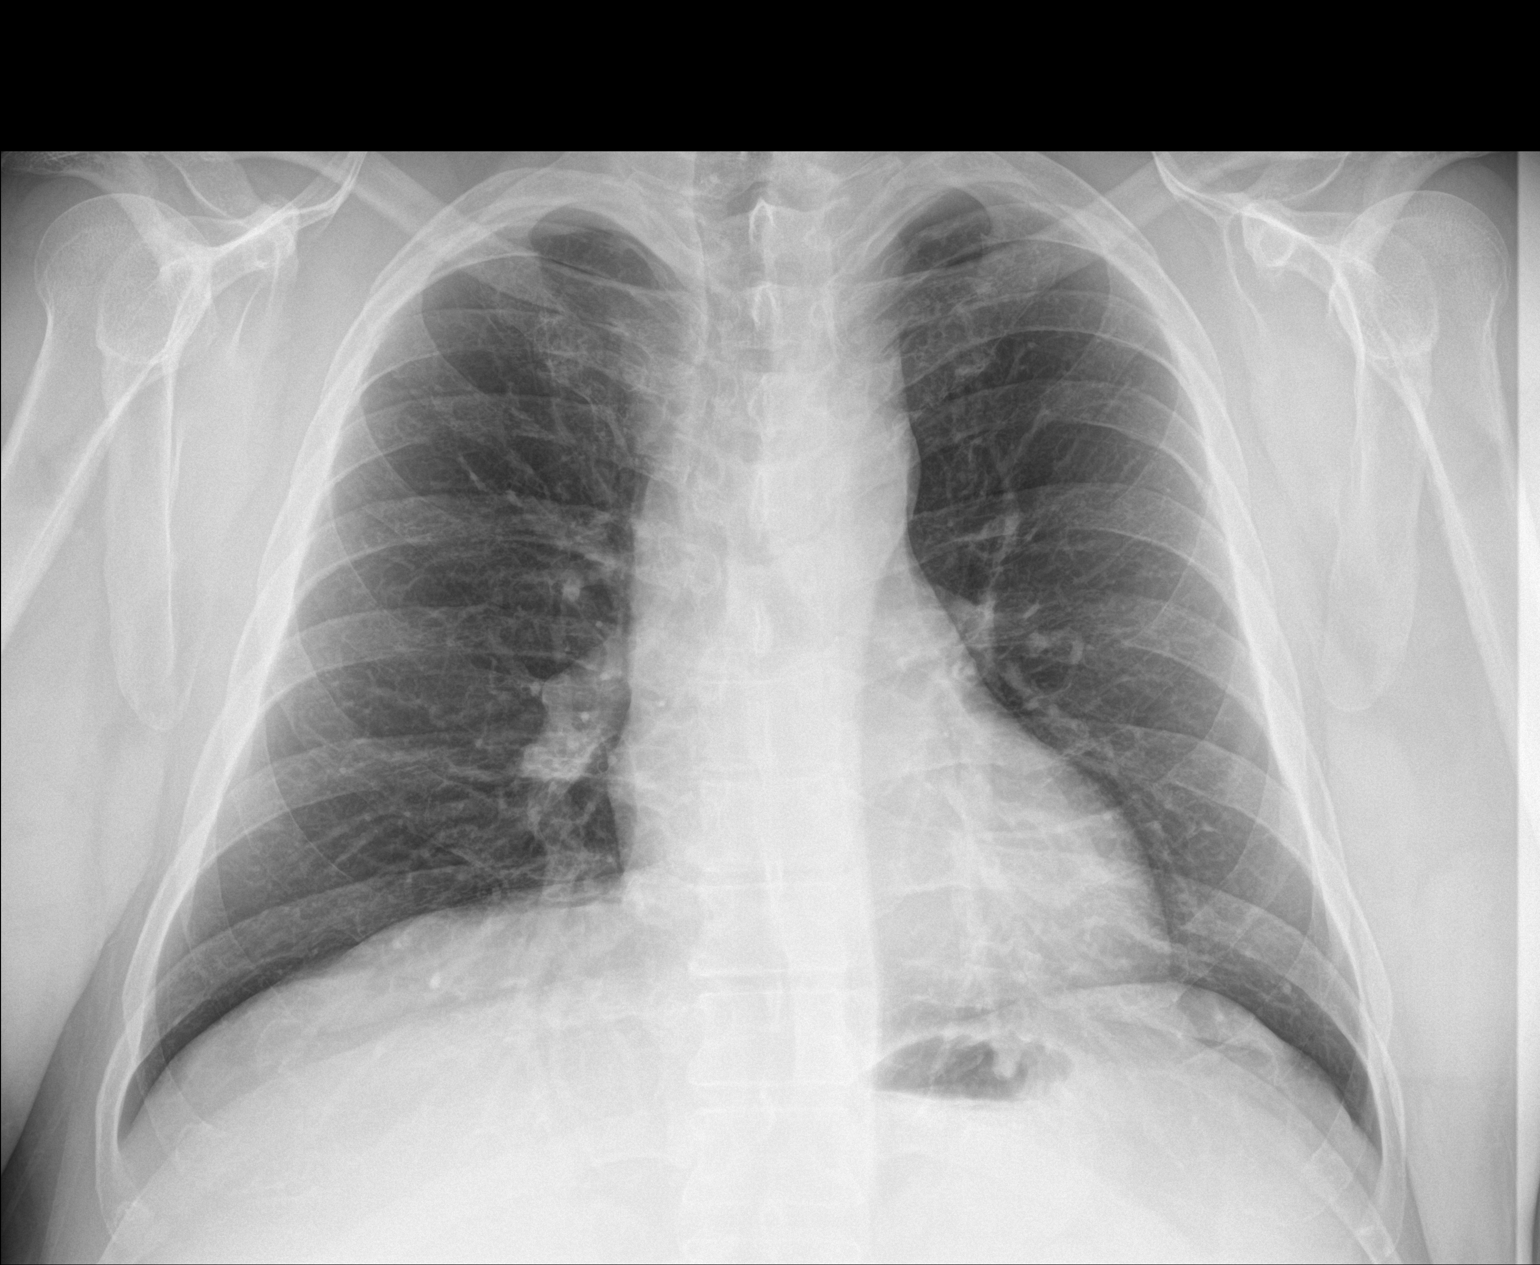

[chest lat]
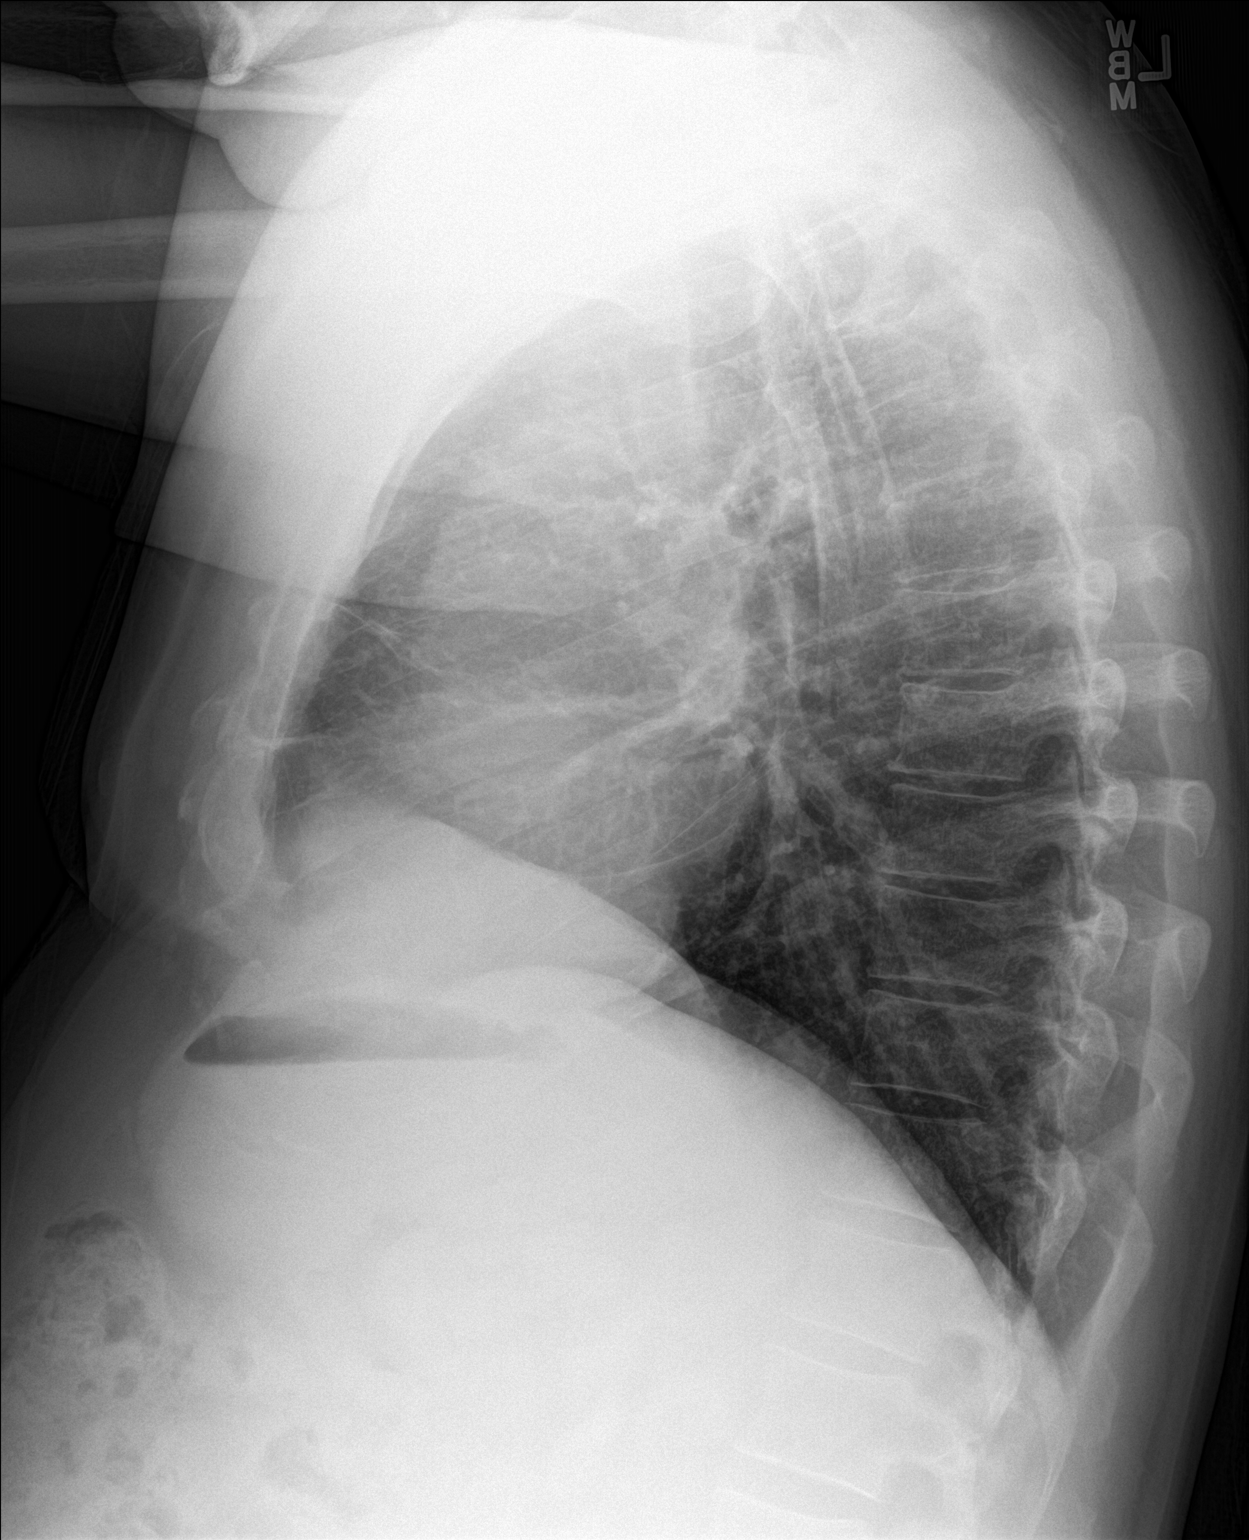

[2 of 2 positions shown; findings below may reference images not displayed]

FINDINGS: The lungs are clear. Heart size is normal. No pneumothorax or
pleural effusion. No bony abnormality.
IMPRESSION: No acute disease.

## 2020-07-10 LAB — HEMOGLOBIN A1C: Hemoglobin A1C: 6.9

## 2020-07-15 ENCOUNTER — Ambulatory Visit (INDEPENDENT_AMBULATORY_CARE_PROVIDER_SITE_OTHER): Payer: BC Managed Care – PPO | Admitting: Family Medicine

## 2020-07-15 ENCOUNTER — Telehealth: Payer: Self-pay | Admitting: Family Medicine

## 2020-07-15 ENCOUNTER — Encounter: Payer: Self-pay | Admitting: Family Medicine

## 2020-07-15 ENCOUNTER — Other Ambulatory Visit: Payer: Self-pay

## 2020-07-15 VITALS — BP 129/80 | HR 100 | Temp 97.7°F | Resp 16 | Ht 67.0 in | Wt 211.8 lb

## 2020-07-15 DIAGNOSIS — Z23 Encounter for immunization: Secondary | ICD-10-CM | POA: Diagnosis not present

## 2020-07-15 DIAGNOSIS — I152 Hypertension secondary to endocrine disorders: Secondary | ICD-10-CM

## 2020-07-15 DIAGNOSIS — J302 Other seasonal allergic rhinitis: Secondary | ICD-10-CM

## 2020-07-15 DIAGNOSIS — Z794 Long term (current) use of insulin: Secondary | ICD-10-CM

## 2020-07-15 DIAGNOSIS — E1159 Type 2 diabetes mellitus with other circulatory complications: Secondary | ICD-10-CM

## 2020-07-15 DIAGNOSIS — E1169 Type 2 diabetes mellitus with other specified complication: Secondary | ICD-10-CM | POA: Diagnosis not present

## 2020-07-15 MED ORDER — FLUTICASONE PROPIONATE 50 MCG/ACT NA SUSP: NASAL | 1 refills | Status: DC

## 2020-07-15 MED ORDER — LISINOPRIL 40 MG PO TABS: 40.0000 mg | ORAL_TABLET | Freq: Every day | ORAL | 1 refills | Status: DC

## 2020-07-15 NOTE — Patient Instructions (Addendum)
Thank you for coming to the office today.  Pneumovax-23 vaccine today.  Restart Lisinopril 40mg  daily  Remain off Amlodipine for now.  Keep on Farxiga 5mg   Refilled Flonase 90 day  Can go get COVID booster whenever ready. Also can do Shingrix (shingles vaccine 2 doses in 2-6 months when ready at pharmacy)  Please schedule a Follow-up Appointment to: Return in about 3 months (around 10/15/2020) for 3 month DM, HTN with PCP Elmyra Ricks.  If you have any other questions or concerns, please feel free to call the office or send a message through [employer]. You may also schedule an earlier appointment if necessary.  Additionally, you may be receiving a survey about your experience at our office within a few days to 1 week by e-mail or mail. We value your feedback.  Luke Putnam, DO [place]

## 2020-07-15 NOTE — Telephone Encounter (Signed)
Pt is calling to see nicole this Wednesday, nicole is out of the office. Pt said nicole told him to follow up with her concerning bp med and for him to bring a list of his BP reading so that she could make the changes to his bp med. Pt has been out of bp med for about a week. Pt bp reading this morning is 150/90. Please advise. Pt is ok with seeing dr Raliegh Ip this Wednesday. Pt requesting wednesday

## 2020-07-15 NOTE — Assessment & Plan Note (Addendum)
Followed by [facility] Endocrinology Dr Honor Junes Last A1c 6.9 At goal On various regimen with insulin, now on new SGLT2 Farxiga low dose, and Trulicity high dose

## 2020-07-15 NOTE — Telephone Encounter (Signed)
Called patient and made appt for today 07/15/20

## 2020-07-15 NOTE — Assessment & Plan Note (Signed)
Improved HTN  Home BP log reviewed, not at goal always but improved now on Farxiga seems improved Question adherence to meds if he doubled Amlodipine/Lisinopril then ran out of meds now. Complications: Obesity, I6NG, OSA  Plan: 1. He is off Amlodipine and BP is improved. Will INCREASE Lisinopril only from 30mg  (1.5 pill) to 40mg  daily send new rx 1 tab daily to pharmacy. He can hold amlodipine for now since off for weeks and BP near goal. - Keep on SGLT2 Farxiga to lower BP as well 2. Encouraged heart healthy diet and increasing exercise to 30 minutes most days of the week, going no more than 2 days in a row without exercise. 3. Check BP 1-2 x per day at home, keep log, and bring to clinic at next appointment.

## 2020-07-15 NOTE — Progress Notes (Signed)
Subjective:    Patient ID: Luke Coleman, male    DOB: 18-Jul-1951, 69 y.o.   MRN: [id number]  Luke Coleman is a 69 y.o. male presenting on 07/15/2020 for Hypertension  PCP Cyndia Skeeters, FNP  HPI    CHRONIC HTN: Reports home BP readings 130-150 / 70-90, variable pulse. He describes that his BP regimen has been variable over years, he was on HCTZ but it caused him to have worsening urinary leakage with urgency. That med as stopped. Now he had been on Amlodipine 10mg  and Lisinopril 20mg , however he was doubling these and ran out too soon, now off meds, his Endocrine had changed his SGLT2 to [place] now. Current Meds - none (Amlodipine 10mg  out, Lisiniopril 30mg  or 1.5 tab of 20mg  - out)   Reports good compliance, did not take meds today. Tolerating well, w/o complaints. - Also he asks about taking Cialis for BP, he said was on for prostate in past and it seemed to lower his BP, then he had prostate removal procedure and then came off med. Denies CP, dyspnea, HA, edema, dizziness / lightheadedness  CHRONIC DM, Type 2: Reports concern that he would like to switch care to only PCP instead of specialist care for Endocrinology. Last seen Dr Honor Junes 07/11/20 recently last week, had labs and A1c entered Meds: Farxiga 5mg  daily (previously on Jardiance but it caused urinary leakage), Basaglar insulin dose up to 50 u daily, Trulicity 4.5mg  weekl y inj Reports good compliance. Tolerating well w/o side-effects Currently on ACEi Denies hypoglycemia, polyuria, visual changes, numbness or tingling.  Health Maintenance: Due for 2nd pneumonia vaccine >1 year after previous vaccine, now to receive Pneumovax-23 today  COVID booster when ready  Shingrix at pharmacy  Depression screen [facility] 2/9 07/15/2020 12/18/2019 07/28/2018  Decreased Interest 0 0 0  Down, Depressed, Hopeless 0 0 0  PHQ - 2 Score 0 0 0    Social History   Tobacco Use  . Smoking status: Never Smoker  . Smokeless tobacco: Never  Used  Vaping Use  . Vaping Use: Never used  Substance Use Topics  . Alcohol use: Yes    Alcohol/week: 0.0 standard drinks    Comment: Occasional  . Drug use: No    Review of Systems Per HPI unless specifically indicated above     Objective:    BP 129/80   Pulse 100   Temp 97.7 F (36.5 C) (Temporal)   Resp 16   Ht 5\' 7"  (1.702 m)   Wt 211 lb 12.8 oz (96.1 kg)   SpO2 99%   BMI 33.17 kg/m   Wt Readings from Last 3 Encounters:  07/15/20 211 lb 12.8 oz (96.1 kg)  05/16/20 208 lb (94.3 kg)  05/10/20 209 lb 12.8 oz (95.2 kg)    Physical Exam Vitals and nursing note reviewed.  Constitutional:      General: He is not in acute distress.    Appearance: He is well-developed. He is obese. He is not diaphoretic.     Comments: Well-appearing, comfortable, cooperative  HENT:     Head: Normocephalic and atraumatic.  Eyes:     General:        Right eye: No discharge.        Left eye: No discharge.     Conjunctiva/sclera: Conjunctivae normal.  Cardiovascular:     Rate and Rhythm: Normal rate.  Pulmonary:     Effort: Pulmonary effort is normal.  Skin:    General: Skin is warm and dry.  Findings: No erythema or rash.  Neurological:     Mental Status: He is alert and oriented to person, place, and time.  Psychiatric:        Behavior: Behavior normal.     Comments: Well groomed, good eye contact, normal speech and thoughts       Results for orders placed or performed in visit on 07/15/20  Hemoglobin A1c  Result Value Ref Range   Hemoglobin A1C 6.9       Assessment & Plan:   Problem List Items Addressed This Visit    Type 2 diabetes mellitus with other specified complication ([employer]) - Primary    Followed by [facility] Endocrinology Dr Honor Junes Last A1c 6.9 At goal On various regimen with insulin, now on new SGLT2 Farxiga low dose, and Trulicity high dose      Relevant Medications   dapagliflozin propanediol (FARXIGA) 5 MG TABS tablet   lisinopril  (ZESTRIL) 40 MG tablet   Hypertension associated with diabetes ([place])    Improved HTN  Home BP log reviewed, not at goal always but improved now on Farxiga seems improved Question adherence to meds if he doubled Amlodipine/Lisinopril then ran out of meds now. Complications: Obesity, K3EX, OSA  Plan: 1. He is off Amlodipine and BP is improved. Will INCREASE Lisinopril only from 30mg  (1.5 pill) to 40mg  daily send new rx 1 tab daily to pharmacy. He can hold amlodipine for now since off for weeks and BP near goal. - Keep on SGLT2 Farxiga to lower BP as well 2. Encouraged heart healthy diet and increasing exercise to 30 minutes most days of the week, going no more than 2 days in a row without exercise. 3. Check BP 1-2 x per day at home, keep log, and bring to clinic at next appointment.       Relevant Medications   dapagliflozin propanediol (FARXIGA) 5 MG TABS tablet   lisinopril (ZESTRIL) 40 MG tablet    Other Visit Diagnoses    Need for 23-polyvalent pneumococcal polysaccharide vaccine       Relevant Orders   Pneumococcal polysaccharide vaccine 23-valent greater than or equal to 2yo subcutaneous/IM (Completed)   Seasonal allergic rhinitis, unspecified trigger       Relevant Medications   fluticasone (FLONASE) 50 MCG/ACT nasal spray       Meds ordered this encounter  Medications  . lisinopril (ZESTRIL) 40 MG tablet    Sig: Take 1 tablet (40 mg total) by mouth daily.    Dispense:  90 tablet    Refill:  1  . fluticasone (FLONASE) 50 MCG/ACT nasal spray    Sig: SHAKE LIQUID AND USE 2 SPRAYS IN EACH NOSTRIL DAILY    Dispense:  48 g    Refill:  1    Follow up plan: Return in about 3 months (around 10/15/2020) for 3 month DM, HTN with PCP Elmyra Ricks.   Nobie Putnam, DO [facility] 07/15/2020, 2:32 PM

## 2020-08-14 NOTE — Telephone Encounter (Signed)
Consent

## 2020-08-20 ENCOUNTER — Encounter: Payer: Self-pay | Admitting: Family Medicine

## 2020-08-20 ENCOUNTER — Other Ambulatory Visit: Payer: Self-pay

## 2020-08-20 ENCOUNTER — Ambulatory Visit (INDEPENDENT_AMBULATORY_CARE_PROVIDER_SITE_OTHER): Payer: BC Managed Care – PPO | Admitting: Family Medicine

## 2020-08-20 VITALS — BP 124/68 | HR 91 | Temp 98.0°F | Resp 18 | Ht 67.0 in | Wt 220.6 lb

## 2020-08-20 DIAGNOSIS — S46211A Strain of muscle, fascia and tendon of other parts of biceps, right arm, initial encounter: Secondary | ICD-10-CM

## 2020-08-20 MED ORDER — CYCLOBENZAPRINE HCL 10 MG PO TABS: 10.0000 mg | ORAL_TABLET | Freq: Three times a day (TID) | ORAL | 1 refills | Status: DC | PRN

## 2020-08-20 MED ORDER — IBUPROFEN 600 MG PO TABS: 600.0000 mg | ORAL_TABLET | Freq: Three times a day (TID) | ORAL | 0 refills | Status: DC | PRN

## 2020-08-20 NOTE — Assessment & Plan Note (Signed)
Right biceps strain x 1 week.  Reports had some discomfort over a week ago for a few days, that he had went to push himself up from a seated position and felt an increase in right shoulder pain.  Reports increased pain with range of motion.  No numbness, tingling, weakness, change in grip strength or difficulty with opening jars/bottles.  Will treat with ibuprofen and cyclobenzaprine.  Discussed if no improvement with treatment plan, would look at referral to Orthopedics to discuss right shoulder MRI to look for tear.  Patient in agreement with plan.

## 2020-08-20 NOTE — Progress Notes (Signed)
Subjective:    Patient ID: Luke Coleman, male    DOB: 12/03/1950, 69 y.o.   MRN: [id number]  Luke Coleman is a 69 y.o. male presenting on 08/20/2020 for Arm Pain (Rt arm and shoulder pain x 1 week. Pt state that it seemed to be improving after a few days, but he think he made it flare up after trying to push himself up. He's currently treating it with heat and OTC pain medication)   HPI  Luke Coleman presents to clinic for concerns of right shoulder pain x 1 week.  Reports he had some discomfort for a few days that resolved and then 1 week ago he was pushing himself up from a seated position and felt an increase in pain.  Pain has been persistent and worsened with ROM.  Denies numbness, tingling, weakness, loss of ROM, change in grip strength or previous right shoulder problems.  Has been taking acetaminophen and topical heat without improvement in symptoms.  Depression screen [facility] 2/9 07/15/2020 12/18/2019 07/28/2018  Decreased Interest 0 0 0  Down, Depressed, Hopeless 0 0 0  PHQ - 2 Score 0 0 0    Social History   Tobacco Use  . Smoking status: Never Smoker  . Smokeless tobacco: Never Used  Vaping Use  . Vaping Use: Never used  Substance Use Topics  . Alcohol use: Yes    Alcohol/week: 0.0 standard drinks    Comment: Occasional  . Drug use: No    Review of Systems  Constitutional: Negative.   HENT: Negative.   Eyes: Negative.   Respiratory: Negative.   Cardiovascular: Negative.   Gastrointestinal: Negative.   Endocrine: Negative.   Genitourinary: Negative.   Musculoskeletal: Positive for arthralgias. Negative for back pain, gait problem, joint swelling, myalgias, neck pain and neck stiffness.  Skin: Negative.   Allergic/Immunologic: Negative.   Neurological: Negative.   Hematological: Negative.   Psychiatric/Behavioral: Negative.    Per HPI unless specifically indicated above     Objective:    BP 124/68 (BP Location: Left Arm, Patient Position: Sitting, Cuff  Size: Large)   Pulse 91   Temp 98 F (36.7 C) (Temporal)   Resp 18   Ht 5\' 7"  (1.702 m)   Wt 220 lb 9.6 oz (100.1 kg)   SpO2 99%   BMI 34.55 kg/m   Wt Readings from Last 3 Encounters:  08/20/20 220 lb 9.6 oz (100.1 kg)  07/15/20 211 lb 12.8 oz (96.1 kg)  05/16/20 208 lb (94.3 kg)    Physical Exam Vitals and nursing note reviewed.  Constitutional:      General: He is not in acute distress.    Appearance: Normal appearance. He is well-developed and well-groomed. He is not ill-appearing or toxic-appearing.  HENT:     Head: Normocephalic and atraumatic.     Nose:     Comments: Luke Coleman is in place, covering mouth and nose. Eyes:     General:        Right eye: No discharge.        Left eye: No discharge.     Extraocular Movements: Extraocular movements intact.     Conjunctiva/sclera: Conjunctivae normal.     Pupils: Pupils are equal, round, and reactive to light.  Cardiovascular:     Rate and Rhythm: Normal rate and regular rhythm.     Pulses: Normal pulses.     Heart sounds: Normal heart sounds. No murmur heard. No friction rub. No gallop.   Pulmonary:  Effort: Pulmonary effort is normal. No respiratory distress.     Breath sounds: Normal breath sounds.  Musculoskeletal:        General: Tenderness present.     Right shoulder: Tenderness present. No swelling. Decreased range of motion. Normal pulse.     Left shoulder: Normal.     Comments: Right biceps tendon tenderness with palpation.  Painful AROM.  Full strength on exam.  Skin:    General: Skin is warm and dry.     Capillary Refill: Capillary refill takes less than 2 seconds.  Neurological:     General: No focal deficit present.     Mental Status: He is alert and oriented to person, place, and time.  Psychiatric:        Attention and Perception: Attention and perception normal.        Mood and Affect: Mood and affect normal.        Speech: Speech normal.        Behavior: Behavior normal. Behavior is cooperative.         Thought Content: Thought content normal.        Cognition and Memory: Cognition and memory normal.    Results for orders placed or performed in visit on 07/15/20  Hemoglobin A1c  Result Value Ref Range   Hemoglobin A1C 6.9       Assessment & Plan:   Problem List Items Addressed This Visit      Musculoskeletal and Integument   Biceps strain, right, initial encounter - Primary    Right biceps strain x 1 week.  Reports had some discomfort over a week ago for a few days, that he had went to push himself up from a seated position and felt an increase in right shoulder pain.  Reports increased pain with range of motion.  No numbness, tingling, weakness, change in grip strength or difficulty with opening jars/bottles.  Will treat with ibuprofen and cyclobenzaprine.  Discussed if no improvement with treatment plan, would look at referral to Orthopedics to discuss right shoulder MRI to look for tear.  Patient in agreement with plan.      Relevant Medications   ibuprofen (ADVIL) 600 MG tablet   cyclobenzaprine (FLEXERIL) 10 MG tablet      Meds ordered this encounter  Medications  . ibuprofen (ADVIL) 600 MG tablet    Sig: Take 1 tablet (600 mg total) by mouth every 8 (eight) hours as needed.    Dispense:  30 tablet    Refill:  0  . cyclobenzaprine (FLEXERIL) 10 MG tablet    Sig: Take 1 tablet (10 mg total) by mouth 3 (three) times daily as needed for muscle spasms.    Dispense:  30 tablet    Refill:  1   Follow up plan: Return if symptoms worsen or fail to improve.   Charlaine Dalton, FNP Family Nurse Practitioner [facility] 08/20/2020, 11:35 AM

## 2020-08-20 NOTE — Patient Instructions (Signed)
You have a right biceps strain.  It is important that we work on relaxing the muscle with NSAIDs and with muscle relaxers.  Once you take the medications as prescribed, be sure to work on your range of motion (see below).  If your symptoms continue to persist for more than 2-3 weeks, please let me know and we can place a referral to Orthopedics, where they would then likely proceed to an MRI  We will plan to see you back if your symptoms worsen or fail to improve  You will receive a survey after today's visit either digitally by e-mail or paper by [employer]. Your experiences and feedback matter to [name].  Please respond so we know how we are doing as we provide care for you.  Call us with any questions/concerns/needs.  It is my goal to be available to you for your health concerns.  Thanks for choosing me to be a partner in your healthcare needs!  Charlaine Dalton, FNP-C Family Nurse Practitioner [facility] Phone: [phone]  Range of Motion Shoulder Exercises  Pendulum Circles - Lean with your good arm against a counter or table for support - Bend forward with a wide stance (make sure your body is comfortable) - Your painful shoulder should hang down and feel "heavy" - Gently move your painful arm in small circles "clockwise" for several turns - Switch to "counterclockwise" for several turns - Early on keep circles narrow and move slowly - Later in rehab, move in larger circles and faster movement   Wall Crawl - Stand close (about 1-2 ft away) to a wall, facing it directly - Reach out with your arm of painful shoulder and place fingers (not palm) on wall - You should make contact with wall at your waist level - Slowly walk your fingers up the wall. Stay in contact with wall entire time, do not remove fingers - Keep walking fingers up wall until you reach shoulder level - You may feel tightening or mild discomfort, once you reach a height  that causes pain or if you are already above your shoulder height then stop. Repeat from starting position. - Early on stand closer to wall, move fingers slowly, and stay at or below shoulder level - Later in rehab, stand farther away from wall (fingertips), move fingers quicker, go above shoulder level

## 2020-08-27 ENCOUNTER — Other Ambulatory Visit: Payer: Self-pay | Admitting: Family Medicine

## 2020-08-27 DIAGNOSIS — S46211A Strain of muscle, fascia and tendon of other parts of biceps, right arm, initial encounter: Secondary | ICD-10-CM

## 2020-11-06 ENCOUNTER — Other Ambulatory Visit: Payer: Self-pay

## 2020-11-06 ENCOUNTER — Telehealth (INDEPENDENT_AMBULATORY_CARE_PROVIDER_SITE_OTHER): Payer: BC Managed Care – PPO | Admitting: Family Medicine

## 2020-11-06 ENCOUNTER — Encounter: Payer: Self-pay | Admitting: Family Medicine

## 2020-11-06 VITALS — Temp 97.6°F

## 2020-11-06 DIAGNOSIS — J0110 Acute frontal sinusitis, unspecified: Secondary | ICD-10-CM

## 2020-11-06 MED ORDER — PSEUDOEPH-BROMPHEN-DM 30-2-10 MG/5ML PO SYRP: 5.0000 mL | ORAL_SOLUTION | Freq: Four times a day (QID) | ORAL | 0 refills | Status: DC | PRN

## 2020-11-06 MED ORDER — AMOXICILLIN-POT CLAVULANATE 875-125 MG PO TABS: 1.0000 | ORAL_TABLET | Freq: Two times a day (BID) | ORAL | 0 refills | Status: DC

## 2020-11-06 NOTE — Patient Instructions (Addendum)
  1. It sounds like you have a Sinusitis (Bacterial Infection) - this most likely started as an Upper Respiratory Virus that has settled into an infection. Allergies can also cause this. - Start Augmentin 1 pill twice daily (breakfast and dinner, with food and plenty of water) for 10 days, complete entire course, do not stop early even if feeling better - Continue Flonase 2 sprays in each nostril daily for next 4-6 weeks, then you may stop and use seasonally or as needed - Recommend to keep using Nasal Saline spray multiple times a day to help flush out congestion and clear sinuses - Improve hydration by drinking plenty of clear fluids (water, gatorade) to reduce secretions and thin congestion - Congestion draining down throat can cause irritation. May try warm herbal tea with honey, cough drops - Can take Tylenol or Ibuprofen as needed for fevers - May continue over the counter cold medicine as you are, I would not use any decongestant or mucinex longer than 7 days.  If you develop persistent fever >101F for at least 3 consecutive days, headaches with sinus pain or pressure or persistent earache, please schedule a follow-up evaluation within next few days to week.   Please schedule a Follow-up Appointment to: Return in about 1 week (around 11/13/2020), or if symptoms worsen or fail to improve, for sinusitis.  If you have any other questions or concerns, please feel free to call the office or send a message through [place]. You may also schedule an earlier appointment if necessary.  Additionally, you may be receiving a survey about your experience at our office within a few days to 1 week by e-mail or mail. We value your feedback.  Nobie Putnam, DO [place]

## 2020-11-06 NOTE — Progress Notes (Signed)
Virtual Visit via Telephone The purpose of this virtual visit is to provide medical care while limiting exposure to the novel coronavirus (COVID19) for both patient and office staff.  Consent was obtained for phone visit:  Yes.   Answered questions that patient had about telehealth interaction:  Yes.   I discussed the limitations, risks, security and privacy concerns of performing an evaluation and management service by telephone. I also discussed with the patient that there may be a patient responsible charge related to this service. The patient expressed understanding and agreed to proceed.  Patient Location: Home Provider Location: [facility] (Office)  Participants in virtual visit: - Patient: Luke Coleman - CMA: Donnie Mesa, [place] - Provider: Dr Parks Ranger  ---------------------------------------------------------------------- Chief Complaint  Patient presents with  . Sinus Problem    Head congestion, productive coughing greenish brown mucus, tinnitus, and sinus pressure x 2 weeks. Pt currently treating symptoms with flonase, but no improvement.     S: Reviewed CMA documentation. I have called patient and gathered additional HPI as follows:  Acute Sinusitis Reports that symptoms started 2 weeks ago with some improve then worsening, then thicker green mucus production, some post nasal drainage, some ear ringing as well with pressure. Tried OTC Sudafed. Sinus lavage twice daily. Flonase.  Denies any known or suspected exposure to person with or possibly with COVID19.  Denies any fevers, chills, sweats, body ache, cough, shortness of breath, headache, abdominal pain, diarrhea  UTD COVID booster, PNA, Flu Shot.  Past Medical History:  Diagnosis Date  . Acute bacterial sinusitis 08/22/2014  . COPD (chronic obstructive pulmonary disease) ([place])   . Diabetes mellitus without complication ([place])   . Enlarged prostate   . Essential (primary) hypertension 03/01/2013    Overview:  Last Assessment & Plan:  Taking meds as prescribed. Check bmet. Continue to follow.   Marland Kitchen HTN (hypertension) 03/01/2013  . Hypertension   . Sleep apnea    on CPAP, USUALLY...CURRENTLY BROKEN   Social History   Tobacco Use  . Smoking status: Never Smoker  . Smokeless tobacco: Never Used  Vaping Use  . Vaping Use: Never used  Substance Use Topics  . Alcohol use: Yes    Alcohol/week: 0.0 standard drinks    Comment: Occasional  . Drug use: No    Current Outpatient Medications:  .  amLODipine (NORVASC) 10 MG tablet, Take 10 mg by mouth daily., Disp: , Rfl:  .  amoxicillin-clavulanate (AUGMENTIN) 875-125 MG tablet, Take 1 tablet by mouth 2 (two) times daily. For 10 days, Disp: 20 tablet, Rfl: 0 .  brompheniramine-pseudoephedrine-DM 30-2-10 MG/5ML syrup, Take 5 mLs by mouth 4 (four) times daily as needed., Disp: 118 mL, Rfl: 0 .  cyclobenzaprine (FLEXERIL) 10 MG tablet, Take 1 tablet (10 mg total) by mouth 3 (three) times daily as needed for muscle spasms., Disp: 30 tablet, Rfl: 1 .  Dulaglutide 4.5 MG/0.5ML SOPN, Inject 4.5 mg into the skin every 7 (seven) days. , Disp: , Rfl:  .  fluticasone (FLONASE) 50 MCG/ACT nasal spray, SHAKE LIQUID AND USE 2 SPRAYS IN EACH NOSTRIL DAILY, Disp: 48 g, Rfl: 1 .  glucose blood (ONE TOUCH ULTRA TEST) test strip, USE TO TEST THREE TIMES DAILY, Disp: 100 each, Rfl: 11 .  Insulin Glargine (BASAGLAR KWIKPEN) 100 UNIT/ML, Inject 50 Units into the skin daily. , Disp: , Rfl:  .  Insulin Pen Needle (BD PEN NEEDLE NANO 2ND GEN) 32G X 4 MM MISC, Use 1 each once daily, Disp: ,  Rfl:  .  Lancets 28G MISC, 3 (three) times daily. Use as instructed. DX: E11.9, Disp: , Rfl:  .  lisinopril (ZESTRIL) 40 MG tablet, Take 1 tablet (40 mg total) by mouth daily., Disp: 90 tablet, Rfl: 1 .  albuterol (PROVENTIL) (2.5 MG/3ML) 0.083% nebulizer solution, Take 2.5 mg by nebulization every 4 (four) hours as needed for wheezing.  (Patient not taking: Reported on 11/06/2020),  Disp: , Rfl:  .  CLENPIQ 10-3.5-12 MG-GM -GM/160ML SOLN, SMARTSIG:320 Milliliter(s) By Mouth As Directed (Patient not taking: No sig reported), Disp: , Rfl:  .  ibuprofen (ADVIL) 600 MG tablet, Take 1 tablet (600 mg total) by mouth every 8 (eight) hours as needed. (Patient not taking: Reported on 11/06/2020), Disp: 30 tablet, Rfl: 0  Depression screen [facility] 2/9 07/15/2020 12/18/2019 07/28/2018  Decreased Interest 0 0 0  Down, Depressed, Hopeless 0 0 0  PHQ - 2 Score 0 0 0    No flowsheet data found.  -------------------------------------------------------------------------- O: No physical exam performed due to remote telephone encounter.  Lab results reviewed.  No results found for this or any previous visit (from the past 2160 hour(s)).  -------------------------------------------------------------------------- A&P:  Problem List Items Addressed This Visit    Sinusitis - Primary   Relevant Medications   amoxicillin-clavulanate (AUGMENTIN) 875-125 MG tablet   brompheniramine-pseudoephedrine-DM 30-2-10 MG/5ML syrup     Consistent with acute frontal sinusitis, likely initially viral URI allergic rhinitis component with worsening concern for bacterial infection.   Plan: 1. Start Augmentin 875-125mg  PO BID x 10 days 2. Continue nasal steroid Flonase 2 sprays in each nostril daily for 4-6 weeks, may repeat course seasonally or as needed  - Bromfed cough syrup 3. Supportive care with nasal saline OTC, hydration, warm compresses and sinus effleurage as demonstrated 4. Return criteria reviewed   Meds ordered this encounter  Medications  . amoxicillin-clavulanate (AUGMENTIN) 875-125 MG tablet    Sig: Take 1 tablet by mouth 2 (two) times daily. For 10 days    Dispense:  20 tablet    Refill:  0  . brompheniramine-pseudoephedrine-DM 30-2-10 MG/5ML syrup    Sig: Take 5 mLs by mouth 4 (four) times daily as needed.    Dispense:  118 mL    Refill:  0    Follow-up: - Return in 1 week if  unresolved  Patient verbalizes understanding with the above medical recommendations including the limitation of remote medical advice.  Specific follow-up and call-back criteria were given for patient to follow-up or seek medical care more urgently if needed.   - Time spent in direct consultation with patient on phone: 6 minutes   Nobie Putnam, [facility] 11/06/2020, 11:49 AM

## 2021-01-08 ENCOUNTER — Other Ambulatory Visit: Payer: Self-pay | Admitting: Family Medicine

## 2021-01-08 DIAGNOSIS — E1159 Type 2 diabetes mellitus with other circulatory complications: Secondary | ICD-10-CM

## 2021-01-08 DIAGNOSIS — I152 Hypertension secondary to endocrine disorders: Secondary | ICD-10-CM

## 2021-01-26 ENCOUNTER — Other Ambulatory Visit: Payer: Self-pay | Admitting: Family Medicine

## 2021-01-26 DIAGNOSIS — J302 Other seasonal allergic rhinitis: Secondary | ICD-10-CM

## 2021-01-26 NOTE — Telephone Encounter (Signed)
Requested Prescriptions  Pending Prescriptions Disp Refills  . fluticasone (FLONASE) 50 MCG/ACT nasal spray [Pharmacy Med Name: FLUTICASONE 50MCG NASAL SP (120) RX] 48 g 1    Sig: SHAKE LIQUID AND USE 2 SPRAYS IN EACH NOSTRIL DAILY     Ear, Nose, and Throat: Nasal Preparations - Corticosteroids Passed - 01/26/2021 10:36 AM      Passed - Valid encounter within last 12 months    Recent Outpatient Visits          2 months ago Acute non-recurrent frontal sinusitis   [place], DO   5 months ago Biceps strain, right, initial encounter   [facility], Lupita Raider, FNP   6 months ago Type 2 diabetes mellitus with other specified complication, with long-term current use of insulin [facility])   Maria Antonia, DO   7 months ago Sinusitis, unspecified chronicity, unspecified location   [place], FNP   7 months ago Cough   [facility], Lupita Raider, [place]

## 2021-03-11 ENCOUNTER — Encounter: Payer: Self-pay | Admitting: Family Medicine

## 2021-03-11 ENCOUNTER — Ambulatory Visit: Payer: BC Managed Care – PPO | Admitting: Family Medicine

## 2021-03-11 ENCOUNTER — Other Ambulatory Visit: Payer: Self-pay

## 2021-03-11 VITALS — BP 128/80 | HR 88 | Ht 67.0 in | Wt 212.8 lb

## 2021-03-11 DIAGNOSIS — R1031 Right lower quadrant pain: Secondary | ICD-10-CM

## 2021-03-11 DIAGNOSIS — S39011A Strain of muscle, fascia and tendon of abdomen, initial encounter: Secondary | ICD-10-CM

## 2021-03-11 DIAGNOSIS — S39013A Strain of muscle, fascia and tendon of pelvis, initial encounter: Secondary | ICD-10-CM

## 2021-03-11 MED ORDER — GABAPENTIN 100 MG PO CAPS: ORAL_CAPSULE | ORAL | 1 refills | Status: DC

## 2021-03-11 NOTE — Patient Instructions (Addendum)
Thank you for coming to the office today.  Start Gabapentin 100mg  capsules, take at night for 2-3 nights only, and then increase to 2 times a day for a few days, and then may increase to 3 times a day, it may make you drowsy, if helps significantly at night only, then you can increase instead to 3 capsules at night, instead of 3 times a day - In the future if needed, we can significantly increase the dose if tolerated well, some common doses are 300mg  three times a day up to 600mg  three times a day, usually it takes several weeks or months to get to higher doses  Recommend to start taking Tylenol Extra Strength 500mg  tabs - take 1 to 2 tabs per dose (max 1000mg ) every 6-8 hours for pain (take regularly, don't skip a dose for next 7 days), max 24 hour daily dose is 6 tablets or 3000mg . In the future you can repeat the same everyday Tylenol course for 1-2 weeks at a time.   Instead of advil / ibuprofen or aleve try the topical anti inflammatory  START anti inflammatory topical - OTC Voltaren (generic Diclofenac) topical 2-4 times a day as needed for pain swelling of affected joint for 1-2 weeks or longer.  If not improving we can refer to Sports Med.   Please schedule a Follow-up Appointment to: Return in about 4 weeks (around 04/08/2021), or if symptoms worsen or fail to improve, for R groin strain.  If you have any other questions or concerns, please feel free to call the office or send a message through [place]. You may also schedule an earlier appointment if necessary.  Additionally, you may be receiving a survey about your experience at our office within a few days to 1 week by e-mail or mail. We value your feedback.  Nobie Putnam, DO [facility], [facility]   Adductor Muscle Strain With Rehab Ask your health care provider which exercises are safe for you. Do exercises exactly as told by your health care provider and adjust them as directed. It is normal to feel mild  stretching, pulling, tightness, or mild discomfort as you do these exercises. Stop right away if you feel sudden pain or your pain gets worse. Do not begin these exercises until told by your health care provider. Strengthening exercises These exercises build strength and endurance in your thighs. Endurance is theability to use your muscles for a long time, even after your muscles get tired. Hip adductor isometrics  This exercise is sometimes called inner thigh squeeze. Sit on a firm chair that positions your knees at about the same height as your hips. Place a large ball, firm pillow, or rolled-up bath towel between your thighs. Squeeze your thighs together, gradually building tension. Hold for __________ seconds. Release the tension gradually. Allow your inner thigh muscles to relax completely before you start the next repetition. Repeat __________ times. Complete this exercise __________ times a day. Hip adduction  This exercise is sometimes called side lying straight leg raises. Lie on your side so your head, shoulder, knee, and hip are in a straight line with each other. To help maintain your balance, you may put the foot of your top leg in front of the leg that is on the floor. Your left / right leg should be on the bottom. Roll your hips slightly forward so your hips are stacked directly over each other and your left / right knee is facing forward. Tense the muscles of your  inner thigh and lift your bottom leg 4-6 inches (10-15 cm). Hold this position for __________ seconds. Slowly lower your leg to the starting position. Allow your muscles to relax completely before you start the next repetition. Repeat __________ times. Complete this exercise __________ times a day. Hip extension  This exercise is sometimes called prone (on your belly) straight leg raises. Lie on your belly on a bed or a firm surface with a pillow under your hips. Tense your buttock muscles and lift your left /  right thigh off the bed. Your left / right knee can be bent or straight, but do not let your back arch. Hold this position for __________ seconds. Slowly return to the starting position. Allow your muscles to relax completely before you start the next repetition. Repeat __________ times. Complete this exercise __________ times a day. Balance exercises These exercises improve or maintain your balance. Balance is important inpreventing falls. Single-leg balance Stand near a railing or by a door frame that you can hold onto as needed. Stand on your left / right foot. Keep your big toe down on the floor and try to keep your arch lifted. If this is too easy, you can stand with your eyes closed or stand on a pillow. Hold this position for __________ seconds. Repeat __________ times. Complete this exercise __________ times a day. Side lunges Stand with your feet together. Keeping one foot in place, step to the side with your other foot about __________ inches (__________ cm). Do not step so far that you feel discomfort in your middle thigh. Push off from your stepping foot to return to the starting position. Repeat __________ times. Complete this exercise __________ times a day. This information is not intended to replace advice given to you by your health care provider. Make sure you discuss any questions you have with your healthcare provider. Document Revised: 11/29/2018 Document Reviewed: 05/10/2018 Elsevier Patient Education  [place].

## 2021-03-11 NOTE — Progress Notes (Signed)
Subjective:    Patient ID: Luke Coleman, male    DOB: [birth year], 70 y.o.   MRN: [id number]  Luke Coleman is a 70 y.o. male presenting on 03/11/2021 for Groin Pain (Running after dog yesterday (03/10/21) and pain started in right side groin. )  Patient presents for a same day appointment.  HPI  R Groin Pain, Strain Reports new acute issue onset yesterday he ran after his pet dog who was running and he said acute strain with pain acutely while running R groin. He said no bruising or redness or swelling. No prior injury to this groin or muscle in past. He describes pain worse with walking, faster causes more pain, also leg abduction out hurts more, sitting can cause more pain. Today pain is improved. He is able to return to work and can be more sedentary to allow it to heal does not need note. - Since onset yesterday he has tried ice packs, heating pad, and Tizanidine muscle relaxant from wife. Tried Advil 200mg  x 3 = 600mg  without relief. After 4 hours he took Aleve without relief. - He has prior athletic history of football, running track in the past. - He has history of Type 2 Diabetes, and is on medication including insulin    Depression screen [facility] 2/9 03/11/2021 07/15/2020 12/18/2019  Decreased Interest 1 0 0  Down, Depressed, Hopeless 1 0 0  PHQ - 2 Score 2 0 0  Altered sleeping 1 - -  Tired, decreased energy 2 - -  Change in appetite 2 - -  Feeling bad or failure about yourself  0 - -  Trouble concentrating 0 - -  Moving slowly or fidgety/restless 0 - -  Suicidal thoughts 0 - -  PHQ-9 Score 7 - -  Difficult doing work/chores Not difficult at all - -    Social History   Tobacco Use   Smoking status: Never   Smokeless tobacco: Never  Vaping Use   Vaping Use: Never used  Substance Use Topics   Alcohol use: Yes    Alcohol/week: 0.0 standard drinks    Comment: Occasional   Drug use: No    Review of Systems Per HPI unless specifically indicated above      Objective:    BP 128/80 (BP Location: Right Arm, Patient Position: Sitting, Cuff Size: Normal)   Pulse 88   Ht 5\' 7"  (1.702 m)   Wt 212 lb 12.8 oz (96.5 kg)   BMI 33.33 kg/m   Wt Readings from Last 3 Encounters:  03/11/21 212 lb 12.8 oz (96.5 kg)  08/20/20 220 lb 9.6 oz (100.1 kg)  07/15/20 211 lb 12.8 oz (96.1 kg)    Physical Exam Vitals and nursing note reviewed.  Constitutional:      General: He is not in acute distress.    Appearance: Normal appearance. He is well-developed. He is not diaphoretic.     Comments: Well-appearing, comfortable, cooperative  HENT:     Head: Normocephalic and atraumatic.  Eyes:     General:        Right eye: No discharge.        Left eye: No discharge.     Conjunctiva/sclera: Conjunctivae normal.  Cardiovascular:     Rate and Rhythm: Normal rate.  Pulmonary:     Effort: Pulmonary effort is normal.  Musculoskeletal:     Comments: R Groin / Hip Able to stand Ambulation with slight antalgic gait due to pain Full hip flexion without problem Seated SLR with  some provoked pain localized R groin hip abduction and adduction with some reproduced pain R Hip internal rotation provoked more pain than R external rotation. Strength intact  Skin:    General: Skin is warm and dry.     Findings: No erythema or rash.  Neurological:     Mental Status: He is alert and oriented to person, place, and time.  Psychiatric:        Mood and Affect: Mood normal.        Behavior: Behavior normal.        Thought Content: Thought content normal.     Comments: Well groomed, good eye contact, normal speech and thoughts   Results for orders placed or performed in visit on 07/15/20  Hemoglobin A1c  Result Value Ref Range   Hemoglobin A1C 6.9       Assessment & Plan:   Problem List Items Addressed This Visit   None Visit Diagnoses     Groin pain, right    -  Primary   Relevant Medications   gabapentin (NEURONTIN) 100 MG capsule   Strain of muscle of right  groin region       Relevant Medications   gabapentin (NEURONTIN) 100 MG capsule       Clinically R groin strain with acute injury onset yesterday while running after dog Today pain mildly improved, still impacting his ambulation and positional symptoms sitting / hip abduction/adduction. Localized symptoms. Exam no hernia and reassuring has hip full range of motion, not involved in back. Possible nerve symptoms associated with some mild radiation of pain into thigh and burning localized. 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    Meds ordered this encounter  Medications   gabapentin (NEURONTIN) 100 MG capsule    Sig: Start 1 capsule daily, increase by 1 cap every 2-3 days as tolerated up to 3 times a day, or may take 3 at once in evening.    Dispense:  90 capsule    Refill:  1     Follow up plan: Return in about 4 weeks (around 04/08/2021), or if symptoms worsen or fail to improve, for R groin strain.  Nobie Putnam, [facility] 03/11/2021, 11:06 AM

## 2021-04-05 ENCOUNTER — Other Ambulatory Visit: Payer: Self-pay | Admitting: Family Medicine

## 2021-04-05 DIAGNOSIS — I152 Hypertension secondary to endocrine disorders: Secondary | ICD-10-CM

## 2021-04-05 DIAGNOSIS — E1159 Type 2 diabetes mellitus with other circulatory complications: Secondary | ICD-10-CM

## 2021-04-05 NOTE — Telephone Encounter (Signed)
Requested medication (s) are due for refill today: yes  Requested medication (s) are on the active medication list: yes  Last refill:  01/08/21 #90  Future visit scheduled: no  Notes to clinic:  overdue lab work   Requested Prescriptions  Pending Prescriptions Disp Refills   lisinopril (ZESTRIL) 40 MG tablet [Pharmacy Med Name: LISINOPRIL '40MG'$  TABLETS] 90 tablet 0    Sig: TAKE 1 TABLET(40 MG) BY MOUTH DAILY     Cardiovascular:  ACE Inhibitors Failed - 04/05/2021  9:10 AM      Failed - Cr in normal range and within 180 days    Creat  Date Value Ref Range Status  06/02/2018 1.30 (H) 0.70 - 1.25 mg/dL Final    Comment:    For patients >30 years of age, the reference limit for Creatinine is approximately 13% higher for people identified as African-American. .    Creatinine, Ser  Date Value Ref Range Status  03/21/2020 1.66 (H) 0.61 - 1.24 mg/dL Final   Creatinine,U  Date Value Ref Range Status  05/13/2017 349.6 mg/dL Final          Failed - K in normal range and within 180 days    Potassium  Date Value Ref Range Status  03/21/2020 3.8 3.5 - 5.1 mmol/L Final          Passed - Patient is not pregnant      Passed - Last BP in normal range    BP Readings from Last 1 Encounters:  03/11/21 128/80          Passed - Valid encounter within last 6 months    Recent Outpatient Visits           3 weeks ago Groin pain, right   [facility], Devonne Doughty, DO   5 months ago Acute non-recurrent frontal sinusitis   [facility] [place], Devonne Doughty, DO   7 months ago Biceps strain, right, initial encounter   [facility], Lupita Raider, FNP   8 months ago Type 2 diabetes mellitus with other specified complication, with long-term current use of insulin [facility])   [place], DO   9 months ago Sinusitis, unspecified chronicity, unspecified location   [facility], Lupita Raider, [place]

## 2021-04-24 ENCOUNTER — Other Ambulatory Visit: Payer: Self-pay

## 2021-04-24 MED ORDER — AMLODIPINE BESYLATE 10 MG PO TABS: 10.0000 mg | ORAL_TABLET | Freq: Every day | ORAL | 1 refills | Status: AC

## 2021-06-30 ENCOUNTER — Ambulatory Visit: Payer: Self-pay | Admitting: *Deleted

## 2021-06-30 NOTE — Telephone Encounter (Signed)
Summary: congestion concerns   The patient has experienced cough and congestion since Friday 06/27/21   The patient shares that the drainage is yellow in color and would like to be prescribed something for discomfort   The patient is experiencing stomach discomfort and back pain as well   Please contact further     Reason for Disposition  [1] Continuous (nonstop) coughing interferes with work or school AND [2] no improvement using cough treatment per Care Advice  Answer Assessment - Initial Assessment Questions 1. ONSET: "When did the cough begin?"      Saturday am 2. SEVERITY: "How bad is the cough today?"      Coughing often 3. SPUTUM: "Describe the color of your sputum" (none, dry cough; clear, white, yellow, green)     Yellow/green 4. HEMOPTYSIS: "Are you coughing up any blood?" If so ask: "How much?" (flecks, streaks, tablespoons, etc.)     no 5. DIFFICULTY BREATHING: "Are you having difficulty breathing?" If Yes, ask: "How bad is it?" (e.g., mild, moderate, severe)    - MILD: No SOB at rest, mild SOB with walking, speaks normally in sentences, can lie down, no retractions, pulse < 100.    - MODERATE: SOB at rest, SOB with minimal exertion and prefers to sit, cannot lie down flat, speaks in phrases, mild retractions, audible wheezing, pulse 100-120.    - SEVERE: Very SOB at rest, speaks in single words, struggling to breathe, sitting hunched forward, retractions, pulse > 120      No trouble 6. FEVER: "Do you have a fever?" If Yes, ask: "What is your temperature, how was it measured, and when did it start?"     unsure 7. CARDIAC HISTORY: "Do you have any history of heart disease?" (e.g., heart attack, congestive heart failure)      no 8. LUNG HISTORY: "Do you have any history of lung disease?"  (e.g., pulmonary embolus, asthma, emphysema)     asthma 9. PE RISK FACTORS: "Do you have a history of blood clots?" (or: recent major surgery, recent prolonged travel, bedridden)      no 10. OTHER SYMPTOMS: "Do you have any other symptoms?" (e.g., runny nose, wheezing, chest pain)       Congestion, fever, nausea, diarrhea 11. PREGNANCY: "Is there any chance you are pregnant?" "When was your last menstrual period?"       na 12. TRAVEL: "Have you traveled out of the country in the last month?" (e.g., travel history, exposures)       Coworkers, wife  Protocols used: Cough - Acute Productive-A-AH

## 2021-06-30 NOTE — Telephone Encounter (Signed)
Call to patient- patient states his symptoms started Friday with intestinal symptoms- diarrhea and vomiting. Patient states he has since developed cough and congestion, possible fever. Patient advised COVID test, treat symptoms OTC, trying to keep fliuds down. Patient is requesting something to help with his cough- he was coughing the whole time during triage. Patient advised do virtual visit UC- if needed before appointment tomorrow for symptoms- not sure if provider will call something in without first having that visit. Patient advised to call office if + COVID.

## 2021-07-01 ENCOUNTER — Telehealth (INDEPENDENT_AMBULATORY_CARE_PROVIDER_SITE_OTHER): Payer: BC Managed Care – PPO | Admitting: Internal Medicine

## 2021-07-01 ENCOUNTER — Encounter: Payer: Self-pay | Admitting: Internal Medicine

## 2021-07-01 ENCOUNTER — Other Ambulatory Visit: Payer: Self-pay

## 2021-07-01 DIAGNOSIS — J410 Simple chronic bronchitis: Secondary | ICD-10-CM

## 2021-07-01 DIAGNOSIS — Z794 Long term (current) use of insulin: Secondary | ICD-10-CM

## 2021-07-01 DIAGNOSIS — U071 COVID-19: Secondary | ICD-10-CM

## 2021-07-01 DIAGNOSIS — E1169 Type 2 diabetes mellitus with other specified complication: Secondary | ICD-10-CM

## 2021-07-01 MED ORDER — PREDNISONE 10 MG PO TABS: ORAL_TABLET | ORAL | 0 refills | Status: DC

## 2021-07-01 MED ORDER — AZITHROMYCIN 250 MG PO TABS: ORAL_TABLET | ORAL | 0 refills | Status: DC

## 2021-07-01 MED ORDER — HYDROCODONE BIT-HOMATROP MBR 5-1.5 MG/5ML PO SOLN: 5.0000 mL | Freq: Three times a day (TID) | ORAL | 0 refills | Status: DC | PRN

## 2021-07-01 NOTE — Progress Notes (Signed)
Virtual Visit via Video Note  I connected with Luke Coleman on 07/01/21 at 11:20 AM EST by a video enabled telemedicine application and verified that I am speaking with the correct person using two identifiers.  Location: Patient: Home Provider: Office  Person's participating in this video call: Webb Silversmith, NP and Luke Coleman.   I discussed the limitations of evaluation and management by telemedicine and the availability of in person appointments. The patient expressed understanding and agreed to proceed.  History of Present Illness:  Pt reports fatigue, headache, nasal congestion, cough, congestion and diarrhea 4 days ago.  The headache is located in his forehead.  He describes the pain as pressure.  He denies dizziness or visual changes.  He is blowing yellow/green mucus out of his nose.  The cough is productive of yellow/green mucus.  He has had some shortness of breath.  He denies runny nose, ear pain, sore throat, nausea, vomiting.  He denies fever but has had chills and body aches.  He has not taken anything OTC for his symptoms.  He has had sick contacts with similar symptoms.  He tested positive for covid today.  He has a history of DM2, well controlled.  He has a history of COPD, uses Albuterol nebulizers as needed.   Past Medical History:  Diagnosis Date   Acute bacterial sinusitis 08/22/2014   COPD (chronic obstructive pulmonary disease) (HCC)    Diabetes mellitus without complication ([place])    Enlarged prostate    Essential (primary) hypertension 03/01/2013   Overview:  Last Assessment & Plan:  Taking meds as prescribed. Check bmet. Continue to follow.    HTN (hypertension) 03/01/2013   Hypertension    Sleep apnea    on CPAP, USUALLY...CURRENTLY BROKEN    Current Outpatient Medications  Medication Sig Dispense Refill   albuterol (PROVENTIL) (2.5 MG/3ML) 0.083% nebulizer solution Take 2.5 mg by nebulization every 4 (four) hours as needed for wheezing.     amLODipine  (NORVASC) 10 MG tablet Take 1 tablet (10 mg total) by mouth daily. 90 tablet 1   CLENPIQ 10-3.5-12 MG-GM -GM/160ML SOLN SMARTSIG:320 Milliliter(s) By Mouth As Directed (Patient not taking: No sig reported)     cyclobenzaprine (FLEXERIL) 10 MG tablet Take 1 tablet (10 mg total) by mouth 3 (three) times daily as needed for muscle spasms. (Patient not taking: Reported on 03/11/2021) 30 tablet 1   Dulaglutide 4.5 MG/0.5ML SOPN Inject 4.5 mg into the skin every 7 (seven) days.      fluticasone (FLONASE) 50 MCG/ACT nasal spray SHAKE LIQUID AND USE 2 SPRAYS IN EACH NOSTRIL DAILY 48 g 1   gabapentin (NEURONTIN) 100 MG capsule Start 1 capsule daily, increase by 1 cap every 2-3 days as tolerated up to 3 times a day, or may take 3 at once in evening. 90 capsule 1   glucose blood (ONE TOUCH ULTRA TEST) test strip USE TO TEST THREE TIMES DAILY 100 each 11   ibuprofen (ADVIL) 600 MG tablet Take 1 tablet (600 mg total) by mouth every 8 (eight) hours as needed. (Patient not taking: No sig reported) 30 tablet 0   Insulin Pen Needle (BD PEN NEEDLE NANO 2ND GEN) 32G X 4 MM MISC Use 1 each once daily     Lancets 28G MISC 3 (three) times daily. Use as instructed. DX: E11.9     lisinopril (ZESTRIL) 40 MG tablet TAKE 1 TABLET(40 MG) BY MOUTH DAILY 90 tablet 0   phentermine (ADIPEX-P) 37.5 MG tablet Take by mouth.  No current facility-administered medications for this visit.    Allergies  Allergen Reactions   Adhesive [Tape]    Latex    Sulfa Antibiotics Rash    Penile rash    Family History  Problem Relation Age of Onset   Early death Mother        MVA - died age 75   Alcohol abuse Father    Hypertension Father    Cancer Father 36       renal cell carcinoma   Heart disease Father 76       MI - died   Diabetes Brother    Sleep apnea Brother    Sleep apnea Daughter    Sleep apnea Brother    Diabetes Maternal Uncle    Diabetes Paternal Aunt    Kidney disease Maternal Grandmother     Social History    Socioeconomic History   Marital status: Married    Spouse name: Robin Searing   Number of children: 1   Years of education: 45   Highest education level: Master's degree (e.g., MA, MS, MEng, MEd, MSW, MBA)  Occupational History   Occupation: Scientist, 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 Badeaux grew up in [place], [place], [place]. He currently is living in [place], [place] with his wife of 2 months. He was married previously to his second wife for 29 years. Previous to that, he was married to his first wife for 7 years and has 1 daughter from this marriage (72 y/o). He widowed in 2013 (second wife). 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. He obtained a Oceanographer in Scientist, physiological, [place] from [employer]. 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 He enjoys baseball. Enjoys the outdoors. He also enjoys watching old movies. 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   Social Determinants of Radio broadcast assistant Strain: Not on file  Food Insecurity: Not on file  Transportation Needs: Not on file  Physical Activity: Not on file  Stress: Not on file  Social Connections: Not on file  Intimate Partner Violence: Not on file     Constitutional: Patient reports fatigue, headache, chills.  Denies fever or abrupt weight changes.  HEENT: Patient reports nasal congestion.  Denies eye pain, eye redness, ear pain, ringing in the ears, wax buildup, runny nose, bloody nose, or sore  throat. Respiratory: Pt reports cough and shortness of breath. Denies difficulty breathing.   Cardiovascular: Denies chest pain, chest tightness, palpitations or swelling in the hands or feet.  Gastrointestinal: Pt reports diarrhea. Denies abdominal pain, bloating, constipation, or blood in the stool.  Neurological: Denies dizziness, difficulty with memory, difficulty with speech or problems with balance and coordination.   No other specific complaints in a complete review of systems (except as listed in HPI above).   Observations/Objective:   Wt Readings from Last 3 Encounters:  03/11/21 212 lb 12.8 oz (96.5 kg)  08/20/20 220 lb 9.6 oz (100.1 kg)  07/15/20 211 lb 12.8 oz (96.1 kg)    General: Appears his stated age, appears unwell but in NAD. HEENT: Nose: Congestion noted; Throat/Mouth: Hoarseness noted Pulmonary/Chest: Normal effort .  Dry cough noted.  No respiratory distress.   Neurological: Alert and oriented.   BMET    Component Value Date/Time   NA 135 03/21/2020 0917   K 3.8 03/21/2020 0917   CL 101 03/21/2020 0917   CO2 22 03/21/2020 0917   GLUCOSE 263 (H) 03/21/2020 0917   BUN 28 (H) 03/21/2020 0917   CREATININE 1.66 (H) 03/21/2020 0917   CREATININE 1.30 (H) 06/02/2018 0915   CALCIUM 9.0 03/21/2020 0917   GFRNONAA 42 (L) 03/21/2020 0917   GFRNONAA 57 (L) 06/02/2018 0915   GFRAA 48 (L) 03/21/2020 0917   GFRAA 66 06/02/2018 0915    Lipid Panel     Component Value Date/Time   CHOL 134 06/02/2018 0915   TRIG 157 (H) 06/02/2018 0915   HDL 34 (L) 06/02/2018 0915   CHOLHDL 3.9 06/02/2018 0915   LDLCALC 74 06/02/2018 0915    CBC    Component Value Date/Time   WBC 7.1 03/21/2020 0917   RBC 4.86 03/21/2020 0917   HGB 16.1 03/21/2020 0917   HCT 43.5 03/21/2020 0917   PLT 180 03/21/2020 0917   MCV 89.5 03/21/2020 0917   MCH 33.1 03/21/2020 0917   MCHC 37.0 (H) 03/21/2020 0917   RDW 12.9 03/21/2020 0917   LYMPHSABS 1,792 06/28/2018 1048   MONOABS 0.5  07/28/2016 0759   EOSABS 168 06/28/2018 1048   BASOSABS 28 06/28/2018 1048    Hgb A1C Lab Results  Component Value Date   HGBA1C 6.9 07/10/2020        Assessment and Plan:  COVID-19:  Encourage rest and fluids Discussed antiviral treatment but will hold off at this time secondary to concern for rebound case of COVID Rx for Pred taper x6 days-monitor sugars as these will likely be slightly elevated Rx for Azithromycin x5 days Rx for Hycodan as needed for cough Continue Albuterol nebulizers as needed  Return precautions discussed  Follow Up Instructions:    I discussed the assessment and treatment plan with the patient. The patient was provided an opportunity to ask questions and all were answered. The patient agreed with the plan and demonstrated an understanding of the instructions.   The patient was advised to call back or seek an in-person evaluation if the symptoms worsen or if the condition fails to improve as anticipated.   Webb Silversmith, NP

## 2021-07-01 NOTE — Telephone Encounter (Signed)
Yes will need to wait to be seen

## 2021-07-01 NOTE — Patient Instructions (Signed)

## 2021-07-08 ENCOUNTER — Ambulatory Visit: Payer: Self-pay

## 2021-07-08 NOTE — Telephone Encounter (Signed)
Pt called in stating he is still not feeling well after taking the medication he was recently prescribed, pt states he still feel congested and coughing  Pt. States he was treated for COVID 19 last week. States he thought he was getting better, "but the cough and congestion in my chest is worse. Coughing up green mucus." Requesting additional medication be sent to pharmacy. "Something to break this up." Declines visit, "unless she wants to see me." Please advise pt.   Answer Assessment - Initial Assessment Questions 1. ONSET: "When did the cough begin?"      Last week 2. SEVERITY: "How bad is the cough today?"      Moderate 3. SPUTUM: "Describe the color of your sputum" (none, dry cough; clear, white, yellow, green)     Yellow 4. HEMOPTYSIS: "Are you coughing up any blood?" If so ask: "How much?" (flecks, streaks, tablespoons, etc.)     No 5. DIFFICULTY BREATHING: "Are you having difficulty breathing?" If Yes, ask: "How bad is it?" (e.g., mild, moderate, severe)    - MILD: No SOB at rest, mild SOB with walking, speaks normally in sentences, can lie down, no retractions, pulse < 100.    - MODERATE: SOB at rest, SOB with minimal exertion and prefers to sit, cannot lie down flat, speaks in phrases, mild retractions, audible wheezing, pulse 100-120.    - SEVERE: Very SOB at rest, speaks in single words, struggling to breathe, sitting hunched forward, retractions, pulse > 120      Mild 6. FEVER: "Do you have a fever?" If Yes, ask: "What is your temperature, how was it measured, and when did it start?"     No 7. CARDIAC HISTORY: "Do you have any history of heart disease?" (e.g., heart attack, congestive heart failure)      No 8. LUNG HISTORY: "Do you have any history of lung disease?"  (e.g., pulmonary embolus, asthma, emphysema)     COPD 9. PE RISK FACTORS: "Do you have a history of blood clots?" (or: recent major surgery, recent prolonged travel, bedridden)     No 10. OTHER SYMPTOMS: "Do you  have any other symptoms?" (e.g., runny nose, wheezing, chest pain)       Runny nose , wheezing 11. PREGNANCY: "Is there any chance you are pregnant?" "When was your last menstrual period?"       N/a 12. TRAVEL: "Have you traveled out of the country in the last month?" (e.g., travel history, exposures)       No  Protocols used: Cough - Acute Productive-A-AH

## 2021-07-09 ENCOUNTER — Other Ambulatory Visit: Payer: Self-pay | Admitting: Internal Medicine

## 2021-07-09 NOTE — Telephone Encounter (Signed)
He was treated with antibiotics and steroids. If he is not better, would recommend in office evaluation.

## 2021-07-10 ENCOUNTER — Encounter: Payer: Self-pay | Admitting: Internal Medicine

## 2021-07-10 ENCOUNTER — Other Ambulatory Visit: Payer: Self-pay

## 2021-07-10 ENCOUNTER — Ambulatory Visit (INDEPENDENT_AMBULATORY_CARE_PROVIDER_SITE_OTHER): Payer: BC Managed Care – PPO | Admitting: Internal Medicine

## 2021-07-10 VITALS — BP 132/68 | HR 59 | Temp 98.2°F | Resp 20 | Ht 67.0 in | Wt 213.6 lb

## 2021-07-10 DIAGNOSIS — R058 Other specified cough: Secondary | ICD-10-CM | POA: Diagnosis not present

## 2021-07-10 DIAGNOSIS — Z8616 Personal history of COVID-19: Secondary | ICD-10-CM

## 2021-07-10 MED ORDER — HYDROCODONE BIT-HOMATROP MBR 5-1.5 MG/5ML PO SOLN: 5.0000 mL | Freq: Three times a day (TID) | ORAL | 0 refills | Status: DC | PRN

## 2021-07-10 MED ORDER — METHYLPREDNISOLONE ACETATE 40 MG/ML IJ SUSP
80.0000 mg | Freq: Once | INTRAMUSCULAR | Status: AC
  Administered 2021-07-10: 80 mg via INTRAMUSCULAR

## 2021-07-10 MED ORDER — FLUTICASONE PROPIONATE HFA 44 MCG/ACT IN AERO: 2.0000 | INHALATION_SPRAY | Freq: Two times a day (BID) | RESPIRATORY_TRACT | 0 refills | Status: DC

## 2021-07-10 NOTE — Progress Notes (Signed)
HPI  Pt presents to the clinic today with c/o fatigue, ringing in the ears, nasal congestion cough, chest tightness and shortness of breath.  He reports this started almost 2 weeks ago.  He was seen 11/8 for the same virtually.  He was prescribed Prednisone and Azithromycin for symptom management.  He reports symptoms have improved but have not resolved.  He denies headache, runny nose, sore throat, nausea, vomiting, diarrhea, throat, fever, chills or body aches.  Review of Systems      Past Medical History:  Diagnosis Date   Acute bacterial sinusitis 08/22/2014   COPD (chronic obstructive pulmonary disease) (HCC)    Diabetes mellitus without complication ([place])    Enlarged prostate    Essential (primary) hypertension 03/01/2013   Overview:  Last Assessment & Plan:  Taking meds as prescribed. Check bmet. Continue to follow.    HTN (hypertension) 03/01/2013   Hypertension    Sleep apnea    on CPAP, USUALLY...CURRENTLY BROKEN    Family History  Problem Relation Age of Onset   Early death Mother        MVA - died age 36   Alcohol abuse Father    Hypertension Father    Cancer Father 57       renal cell carcinoma   Heart disease Father 50       MI - died   Diabetes Brother    Sleep apnea Brother    Sleep apnea Daughter    Sleep apnea Brother    Diabetes Maternal Uncle    Diabetes Paternal Aunt    Kidney disease Maternal Grandmother     Social History   Socioeconomic History   Marital status: Married    Spouse name: Robin Searing   Number of children: 1   Years of education: 18   Highest education level: Master's degree (e.g., MA, MS, MEng, MEd, MSW, MBA)  Occupational History   Occupation: Scientist, 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 Elsass grew up in [place], [place], [place]. He currently is living in [place], [place] with his wife of 2 months. He was married previously to his second wife for 29 years. Previous to that, he was married to his first wife for 7 years and has 1 daughter from this marriage (69 y/o). He widowed in 2013 (second wife). 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. He obtained a Oceanographer in Scientist, physiological, [place] from [employer]. 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 He enjoys baseball. Enjoys the outdoors. He also enjoys watching old movies. 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   Social Determinants of Radio broadcast assistant Strain: Not on file  Food Insecurity: Not on file  Transportation Needs: Not on file  Physical Activity: Not on file  Stress: Not on file  Social Connections: Not on file  Intimate Partner Violence: Not on file    Allergies  Allergen  Reactions   Adhesive [Tape]    Latex    Sulfa Antibiotics Rash    Penile rash     Constitutional: Positive fatigue. Denies headache, fever or abrupt weight changes.  HEENT:  Positive ringing in the ears, nasal congestion. Denies eye redness, eye pain, pressure behind the eyes, facial pain,  ear pain,  wax buildup, runny nose or sore throat. Respiratory: Positive cough and shortness of breath. Denies difficulty breathing.  Cardiovascular: Patient reports chest tightness.  Denies chest pain, palpitations or swelling in the hands or feet.   No other specific complaints in a complete review of systems (except as listed in HPI above).  Objective:   BP (!) 121/103 (BP Location: Right Arm, Patient Position: Sitting, Cuff Size: Large)   Pulse (!) 102   Temp 98.2  F (36.8 C) (Temporal)   Resp 20   Ht 5\' 7"  (1.702 m)   Wt 213 lb 9.6 oz (96.9 kg)   SpO2 98%   BMI 33.45 kg/m   Wt Readings from Last 3 Encounters:  03/11/21 212 lb 12.8 oz (96.5 kg)  08/20/20 220 lb 9.6 oz (100.1 kg)  07/15/20 211 lb 12.8 oz (96.1 kg)     General: Appears his stated age, obese  in NAD. HEENT: Head: normal shape and size; Eyes: sclera white, no icterus, conjunctiva pink; Ears: Tm's gray and intact, normal light reflex; Throat/Mouth: + PND. Teeth present, mucosa erythematous and moist, no exudate noted, no lesions or ulcerations noted.  Neck: No cervical lymphadenopathy.  Cardiovascular: Normal rate and rhythm. S1,S2 noted.  No murmur, rubs or gallops noted.  Pulmonary/Chest: Normal effort and positive vesicular breath sounds. No respiratory distress. No wheezes, rales or ronchi noted.       Assessment & Plan:   Post Viral Cough Syndrome, Hx of Covid:  Get some rest and drink plenty of water No indication for additional antibiotics at this time 80 mg Depo-Medrol IM today Rx for Flovent 44 mics 2 sprays every 12 hours x2 weeks Hycodan refilled today  RTC as needed or if symptoms persist.   Webb Silversmith, NP This visit occurred during the SARS-CoV-2 public health emergency.  Safety protocols were in place, including screening questions prior to the visit, additional usage of staff PPE, and extensive cleaning of exam room while observing appropriate contact time as indicated for disinfecting solutions.

## 2021-07-10 NOTE — Patient Instructions (Signed)

## 2021-07-10 NOTE — Addendum Note (Signed)
Addended by: Wilson Singer on: 07/10/2021 03:13 PM   Modules accepted: Orders

## 2021-07-25 ENCOUNTER — Other Ambulatory Visit: Payer: Self-pay | Admitting: Internal Medicine

## 2021-07-25 DIAGNOSIS — J302 Other seasonal allergic rhinitis: Secondary | ICD-10-CM

## 2021-07-26 NOTE — Telephone Encounter (Signed)
   Requested medication (s) are due for refill today: yes  Requested medication (s) are on the active medication list: yes  Last refill:  04/25/21  Future visit scheduled: no  Notes to clinic:  ordered by provider from [facility], Webb Silversmith listed as PCP, seen last at [facility], please assess.  Requested Prescriptions  Pending Prescriptions Disp Refills   fluticasone (FLONASE) 50 MCG/ACT nasal spray [Pharmacy Med Name: FLUTICASONE 50MCG NASAL SP (120) RX] 48 g 1    Sig: SHAKE LIQUID AND USE 2 SPRAYS IN EACH NOSTRIL DAILY     Ear, Nose, and Throat: Nasal Preparations - Corticosteroids Passed - 07/25/2021  8:32 AM      Passed - Valid encounter within last 12 months    Recent Outpatient Visits           2 weeks ago Post-viral cough syndrome   [facility] [place], Coralie Keens, NP   3 weeks ago [facility] [place], Coralie Keens, NP   4 months ago Groin pain, right   [place], DO   8 months ago Acute non-recurrent frontal sinusitis   [facility] [place], Devonne Doughty, DO   11 months ago Biceps strain, right, initial encounter   [facility], Lupita Raider, [place]

## 2021-08-06 ENCOUNTER — Other Ambulatory Visit: Payer: Self-pay | Admitting: Internal Medicine

## 2021-08-06 NOTE — Telephone Encounter (Signed)
Requested Prescriptions  Pending Prescriptions Disp Refills   FLOVENT HFA 44 MCG/ACT inhaler [Pharmacy Med Name: [place] HFA 44MCG ORAL INH 120INH] 10.6 g 0    Sig: INHALE 2 PUFFS INTO THE LUNGS TWICE DAILY     Pulmonology:  Corticosteroids Passed - 08/06/2021  3:14 AM      Passed - Valid encounter within last 12 months    Recent Outpatient Visits          3 weeks ago Post-viral cough syndrome   [facility] [place], Coralie Keens, NP   1 month ago [facility] [place], Coralie Keens, NP   4 months ago Groin pain, right   [place], DO   9 months ago Acute non-recurrent frontal sinusitis   [facility] [place], Devonne Doughty, DO   11 months ago Biceps strain, right, initial encounter   [facility], Lupita Raider, [place]

## 2021-08-08 ENCOUNTER — Other Ambulatory Visit: Payer: Self-pay

## 2021-08-10 ENCOUNTER — Other Ambulatory Visit: Payer: Self-pay | Admitting: Internal Medicine

## 2021-08-11 ENCOUNTER — Ambulatory Visit (INDEPENDENT_AMBULATORY_CARE_PROVIDER_SITE_OTHER): Payer: BC Managed Care – PPO

## 2021-08-11 ENCOUNTER — Other Ambulatory Visit: Payer: Self-pay

## 2021-08-11 DIAGNOSIS — Z23 Encounter for immunization: Secondary | ICD-10-CM | POA: Diagnosis not present

## 2021-08-11 NOTE — Telephone Encounter (Signed)
Requested medication (s) are due for refill today:   No   No refills remain on the Rx  Requested medication (s) are on the active medication list:   Yes  Future visit scheduled:   No   Last ordered: 08/06/2021 10.6 gm, 0 refills  Returned because no refills remain on this rx.   Pt requesting early approval for next fill.   Requested Prescriptions  Pending Prescriptions Disp Refills   FLOVENT HFA 44 MCG/ACT inhaler [Pharmacy Med Name: [place] HFA 44MCG ORAL INH 120INH] 10.6 g 0    Sig: INHALE 2 PUFFS INTO THE LUNGS TWICE DAILY     Pulmonology:  Corticosteroids Passed - 08/10/2021  8:09 AM      Passed - Valid encounter within last 12 months    Recent Outpatient Visits           1 month ago Post-viral cough syndrome   [facility] [place], Coralie Keens, NP   1 month ago [facility] [place], Coralie Keens, NP   5 months ago Groin pain, right   [place], DO   9 months ago Acute non-recurrent frontal sinusitis   [facility] [place], Devonne Doughty, DO   11 months ago Biceps strain, right, initial encounter   [facility], Lupita Raider, [place]              No

## 2022-06-09 ENCOUNTER — Other Ambulatory Visit: Payer: Self-pay | Admitting: Physician Assistant

## 2022-06-09 DIAGNOSIS — R058 Other specified cough: Secondary | ICD-10-CM

## 2022-07-27 ENCOUNTER — Ambulatory Visit: Payer: Self-pay | Admitting: *Deleted

## 2022-07-27 ENCOUNTER — Encounter: Payer: Self-pay | Admitting: Internal Medicine

## 2022-07-27 ENCOUNTER — Ambulatory Visit (INDEPENDENT_AMBULATORY_CARE_PROVIDER_SITE_OTHER): Payer: BC Managed Care – PPO | Admitting: Internal Medicine

## 2022-07-27 VITALS — BP 138/70 | HR 104 | Temp 98.7°F | Wt 208.0 lb

## 2022-07-27 DIAGNOSIS — J101 Influenza due to other identified influenza virus with other respiratory manifestations: Secondary | ICD-10-CM

## 2022-07-27 DIAGNOSIS — R52 Pain, unspecified: Secondary | ICD-10-CM | POA: Diagnosis not present

## 2022-07-27 LAB — POCT INFLUENZA A/B
Influenza A, POC: POSITIVE — AB
Influenza B, POC: NEGATIVE

## 2022-07-27 MED ORDER — OSELTAMIVIR PHOSPHATE 75 MG PO CAPS: 75.0000 mg | ORAL_CAPSULE | Freq: Two times a day (BID) | ORAL | 0 refills | Status: DC

## 2022-07-27 NOTE — Progress Notes (Signed)
Subjective:    Patient ID: Luke Coleman, male    DOB: July 15, 1951, 71 y.o.   MRN: [id number]  HPI  Patient presents to clinic today with complaint of fever, headache, nasal congestion, cough, chest congestion and shortness of breath.  This started 2 days ago. The headache is located in his forehead. He describes the pain as pressure. He is not blowing any mucous out of his nose. The cough is productive of green mucous. He denies runny nose, ear pain, sore throat, chest pain, nausea, vomiting or diarrhea. He denies fever but had chills and body aches. He has tried Zicam, Tylenol Cold and Flu and Ibuprofen with minimal relief of symptoms. He has had sick contacts with minimal relief of symptoms.   Review of Systems     Past Medical History:  Diagnosis Date   Acute bacterial sinusitis 08/22/2014   COPD (chronic obstructive pulmonary disease) (HCC)    Diabetes mellitus without complication ([place])    Enlarged prostate    Essential (primary) hypertension 03/01/2013   Overview:  Last Assessment & Plan:  Taking meds as prescribed. Check bmet. Continue to follow.    HTN (hypertension) 03/01/2013   Hypertension    Sleep apnea    on CPAP, USUALLY...CURRENTLY BROKEN    Current Outpatient Medications  Medication Sig Dispense Refill   albuterol (PROVENTIL) (2.5 MG/3ML) 0.083% nebulizer solution Take 2.5 mg by nebulization every 4 (four) hours as needed for wheezing.     amLODipine (NORVASC) 10 MG tablet Take 1 tablet (10 mg total) by mouth daily. 90 tablet 1   Dulaglutide 4.5 MG/0.5ML SOPN Inject 4.5 mg into the skin every 7 (seven) days.      FLOVENT HFA 44 MCG/ACT inhaler INHALE 2 PUFFS INTO THE LUNGS TWICE DAILY 10.6 g 1   fluticasone (FLONASE) 50 MCG/ACT nasal spray SHAKE LIQUID AND USE 2 SPRAYS IN EACH NOSTRIL DAILY 48 g 1   glucose blood (ONE TOUCH ULTRA TEST) test strip USE TO TEST THREE TIMES DAILY 100 each 11   HYDROcodone bit-homatropine (HYCODAN) 5-1.5 MG/5ML syrup Take 5 mLs by mouth  every 8 (eight) hours as needed for cough. 120 mL 0   Insulin Pen Needle (BD PEN NEEDLE NANO 2ND GEN) 32G X 4 MM MISC Use 1 each once daily     Lancets 28G MISC 3 (three) times daily. Use as instructed. DX: E11.9     lisinopril (ZESTRIL) 40 MG tablet TAKE 1 TABLET(40 MG) BY MOUTH DAILY 90 tablet 0   No current facility-administered medications for this visit.    Allergies  Allergen Reactions   Adhesive [Tape]    Latex    Sulfa Antibiotics Rash    Penile rash    Family History  Problem Relation Age of Onset   Early death Mother        MVA - died age 67   Alcohol abuse Father    Hypertension Father    Cancer Father 43       renal cell carcinoma   Heart disease Father 3       MI - died   Diabetes Brother    Sleep apnea Brother    Sleep apnea Daughter    Sleep apnea Brother    Diabetes Maternal Uncle    Diabetes Paternal Aunt    Kidney disease Maternal Grandmother     Social History   Socioeconomic History   Marital status: Married    Spouse name: Robin Searing   Number of children: 1  Years of education: 35   Highest education level: Master's degree (e.g., MA, MS, MEng, MEd, MSW, MBA)  Occupational History   Occupation: Scientist, 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 Stranahan grew up in [place], [place], [place]. He currently is living in [place], [place] with his wife of 2 months. He was married previously to his second wife for 29 years. Previous to that, he was married to his first wife for 7 years and has 1 daughter from this marriage (74 y/o). He widowed in 2013 (second wife). 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. He obtained a Oceanographer in Scientist, physiological, [place] from [employer]. 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 He enjoys baseball. Enjoys the outdoors. He also enjoys watching old movies. 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   Social Determinants of Health   Financial Resource Strain: Low Risk  (11/05/2017)   Overall Financial Resource Strain (CARDIA)    Difficulty of Paying Living Expenses: Not hard at all  Food Insecurity: No Food Insecurity (11/05/2017)   Hunger Vital Sign    Worried About Running Out of Food in the Last Year: Never true    [place] in the Last Year: Never true  Transportation Needs: No Transportation Needs (11/05/2017)   PRAPARE - Hydrologist (Medical): No    Lack of Transportation (Non-Medical): No  Physical Activity: Unknown (11/05/2017)   Exercise Vital Sign    Days of Exercise per Week: 0 days    Minutes of Exercise per Session: Not on file  Stress: Not on file  Social Connections: Not on file  Intimate Partner Violence: Not At Risk (11/05/2017)   Humiliation, Afraid, Rape, and Kick questionnaire    Fear of Current or Ex-Partner: No    Emotionally Abused: No    Physically Abused: No    Sexually Abused: No     Constitutional: Patient reports headache.  Denies malaise, fatigue, or abrupt weight changes.  HEENT: Patient reports nasal congestion.  Denies eye pain, eye redness, ear pain, ringing in the ears, wax buildup, runny nose, bloody nose, or sore throat. Respiratory: Patient reports cough, chest congestion and shortness of breath.  Denies difficulty breathing.   Cardiovascular: Denies chest pain, chest tightness, palpitations or swelling in the hands or feet.  Gastrointestinal: Denies abdominal pain, bloating, constipation, diarrhea or blood in the stool.    No other specific complaints in a complete review of systems (except as listed in HPI above).  Objective:   Physical Exam   BP 138/70 (BP Location: Right Arm, Patient Position: Sitting, Cuff Size: Normal)   Pulse (!) 104   Temp 98.7 F (37.1 C) (Oral)   Wt 208 lb (94.3 kg)   SpO2 98%   BMI 32.58 kg/m   Wt Readings from Last 3 Encounters:  07/10/21 213 lb 9.6 oz (96.9 kg)  03/11/21 212  lb 12.8 oz (96.5 kg)  08/20/20 220 lb 9.6 oz (100.1 kg)    General: Appears his stated age, obese, in NAD. Skin: Warm, dry and intact. No rashes noted. HEENT: Head: normal shape and size, maxillary sinus tenderness noted; Eyes: sclera white, no icterus, conjunctiva pink, PERRLA and EOMs intact; Ears: Tm's gray and intact, normal light reflex; Nose: mucosa boggy and moist, septum midline; Throat/Mouth: Teeth present, mucosa erythematous and moist, + PND, no exudate, lesions or ulcerations noted.  Neck: No adenopathy noted. Cardiovascular: Tachycardic with normal rhythm. S1,S2 noted.  No murmur, rubs or gallops noted. Pulmonary/Chest: Normal effort and positive vesicular breath sounds. No respiratory distress. No wheezes, rales or ronchi noted.  Neurological: Alert and oriented.   BMET    Component Value Date/Time   NA 135 03/21/2020 0917   K 3.8 03/21/2020 0917   CL 101 03/21/2020 0917   CO2 22 03/21/2020 0917   GLUCOSE 263 (H) 03/21/2020 0917   BUN 28 (H) 03/21/2020 0917   CREATININE 1.66 (H) 03/21/2020 0917   CREATININE 1.30 (H) 06/02/2018 0915   CALCIUM 9.0 03/21/2020 0917   GFRNONAA 42 (L) 03/21/2020 0917   GFRNONAA 57 (L) 06/02/2018 0915   GFRAA 48 (L) 03/21/2020 0917   GFRAA 66 06/02/2018 0915    Lipid Panel     Component Value Date/Time   CHOL 134 06/02/2018 0915   TRIG 157 (H) 06/02/2018 0915   HDL 34 (L) 06/02/2018 0915   CHOLHDL 3.9 06/02/2018 0915   LDLCALC 74 06/02/2018 0915    CBC    Component Value Date/Time   WBC 7.1 03/21/2020 0917   RBC 4.86 03/21/2020  0917   HGB 16.1 03/21/2020 0917   HCT 43.5 03/21/2020 0917   PLT 180 03/21/2020 0917   MCV 89.5 03/21/2020 0917   MCH 33.1 03/21/2020 0917   MCHC 37.0 (H) 03/21/2020 0917   RDW 12.9 03/21/2020 0917   LYMPHSABS 1,792 06/28/2018 1048   MONOABS 0.5 07/28/2016 0759   EOSABS 168 06/28/2018 1048   BASOSABS 28 06/28/2018 1048    Hgb A1C Lab Results  Component Value Date   HGBA1C 6.9 07/10/2020           Assessment & Plan:   Nasal Congestion, Cough, Chest Congestion, Shortness of Breath, Body Aches and Chills:  Rapid flu: Positive Encourage rest and fluids Rx for Tamiflu 75 mg twice daily x5 days Okay to continue Ibuprofen and Tylenol OTC as needed for fever or body aches Recommend Delsym or NyQuil OTC as needed for cough    Schedule an appointment for your annual exam Webb Silversmith, NP

## 2022-07-27 NOTE — Patient Instructions (Signed)

## 2022-07-27 NOTE — Telephone Encounter (Signed)
  Chief Complaint: Congestion Symptoms: fever, productive cough, greenish Frequency:  Pertinent Negatives: Patient denies  Disposition: '[]'$ ED /'[]'$ Urgent Care (no appt availability in office) / '[x]'$ Appointment(In office/virtual)/ '[]'$  [facility]/ '[]'$ Home Care/ '[]'$ Refused Recommended Disposition /'[]'$ [facility]/ '[]'$  Follow-up with PCP Additional Notes:  Agent secured appt prior to triage, appt at 820, triage incomplete due to timeframe. Pt tested neg for covid this AM, Advised pt to wear mask to appt.  Answer Assessment - Initial Assessment Questions 1. ONSET: "When did the cough begin?"      *No Answer* 2. SEVERITY: "How bad is the cough today?"       3. SPUTUM: "Describe the color of your sputum" (none, dry cough; clear, white, yellow, green)     Greenish 4. HEMOPTYSIS: "Are you coughing up any blood?" If so ask: "How much?" (flecks, streaks, tablespoons, etc.)      5. DIFFICULTY BREATHING: "Are you having difficulty breathing?" If Yes, ask: "How bad is it?" (e.g., mild, moderate, severe)    - MILD: No SOB at rest, mild SOB with walking, speaks normally in sentences, can lie down, no retractions, pulse < 100.    - MODERATE: SOB at rest, SOB with minimal exertion and prefers to sit, cannot lie down flat, speaks in phrases, mild retractions, audible wheezing, pulse 100-120.    - SEVERE: Very SOB at rest, speaks in single words, struggling to breathe, sitting hunched forward, retractions, pulse > 120      With congestion 6. FEVER: "Do you have a fever?" If Yes, ask: "What is your temperature, how was it measured, and when did it start?"     99.0 after advil 103 last night 7. CARDIAC HISTORY: "Do you have any history of heart disease?" (e.g., heart attack, congestive heart failure)       8. LUNG HISTORY: "Do you have any history of lung disease?"  (e.g., pulmonary embolus, asthma, emphysema)      9. PE RISK FACTORS: "Do you have a history of blood clots?" (or: recent major  surgery, recent prolonged travel, bedridden)  10. OTHER SYMPTOMS: "Do you have any other symptoms?" (e.g., runny nose, wheezing, chest pain)  Protocols used: Cough - Acute Productive-A-AH

## 2022-07-29 ENCOUNTER — Encounter: Payer: Self-pay | Admitting: Internal Medicine

## 2022-07-29 ENCOUNTER — Ambulatory Visit: Payer: Self-pay | Admitting: *Deleted

## 2022-07-29 NOTE — Telephone Encounter (Signed)
Chief Complaint: worsening sx of flu. Chest discomfort  Symptoms: started tamiflu Monday. Worsening cough with productive green sputum. Chest congestion worsening. Low grade fever.  Frequency: since Monday  Pertinent Negatives: Patient denies chest pain no shortness of breath . Disposition: '[]'$ ED /'[x]'$ Urgent Care (no appt availability in office) / '[]'$ Appointment(In office/virtual)/ '[]'$  [facility]/ '[]'$ Home Care/ '[x]'$ Refused Recommended Disposition /'[]'$ [facility]/ '[]'$  Follow-up with PCP Additional Notes:   No available appt with PCP. Recommended UC due to need for possible chest xray. Patient requesting advise from PCP since last seen 07/27/22. Patient does not want to leave house due to sx. Please advise     Reason for Disposition  [1] MILD difficulty breathing (e.g., minimal/no SOB at rest, SOB with walking, pulse <100) AND [2] still present when not coughing  Answer Assessment - Initial Assessment Questions 1. LOCATION: "Where does it hurt?"       Chest discomfort 2. RADIATION: "Does the pain go anywhere else?" (e.g., into neck, jaw, arms, back)     na 3. ONSET: "When did the chest pain begin?" (Minutes, hours or days)      Monday due to flu sx. Cough  4. PATTERN: "Does the pain come and go, or has it been constant since it started?"  "Does it get worse with exertion?"      Na  5. DURATION: "How long does it last" (e.g., seconds, minutes, hours)     na 6. SEVERITY: "How bad is the pain?"  (e.g., Scale 1-10; mild, moderate, or severe)    - MILD (1-3): doesn't interfere with normal activities     - MODERATE (4-7): interferes with normal activities or awakens from sleep    - SEVERE (8-10): excruciating pain, unable to do any normal activities       Discomfort esp with coughing  7. CARDIAC RISK FACTORS: "Do you have any history of heart problems or risk factors for heart disease?" (e.g., angina, prior heart attack; diabetes, high blood pressure, high cholesterol, smoker,  or strong family history of heart disease)     na 8. PULMONARY RISK FACTORS: "Do you have any history of lung disease?"  (e.g., blood clots in lung, asthma, emphysema, birth control pills)     S/p flu 9. CAUSE: "What do you think is causing the chest pain?"     Cough from flu 10. OTHER SYMPTOMS: "Do you have any other symptoms?" (e.g., dizziness, nausea, vomiting, sweating, fever, difficulty breathing, cough)       Cough productive green. Chest discomfort no sweating no dizziness reported 11. PREGNANCY: "Is there any chance you are pregnant?" "When was your last menstrual period?"       na  Answer Assessment - Initial Assessment Questions 1. ONSET: "When did the cough begin?"      Monday dx flu 2. SEVERITY: "How bad is the cough today?"      Worse chest discomfort  3. SPUTUM: "Describe the color of your sputum" (none, dry cough; clear, white, yellow, green)     Green  4. HEMOPTYSIS: "Are you coughing up any blood?" If so ask: "How much?" (flecks, streaks, tablespoons, etc.)     na 5. DIFFICULTY BREATHING: "Are you having difficulty breathing?" If Yes, ask: "How bad is it?" (e.g., mild, moderate, severe)    - MILD: No SOB at rest, mild SOB with walking, speaks normally in sentences, can lie down, no retractions, pulse < 100.    - MODERATE: SOB at rest, SOB with minimal exertion and  prefers to sit, cannot lie down flat, speaks in phrases, mild retractions, audible wheezing, pulse 100-120.    - SEVERE: Very SOB at rest, speaks in single words, struggling to breathe, sitting hunched forward, retractions, pulse > 120      Denies shortness of breath at rest.  6. FEVER: "Do you have a fever?" If Yes, ask: "What is your temperature, how was it measured, and when did it start?"     Yes low grade 7. CARDIAC HISTORY: "Do you have any history of heart disease?" (e.g., heart attack, congestive heart failure)      na 8. LUNG HISTORY: "Do you have any history of lung disease?"  (e.g., pulmonary  embolus, asthma, emphysema)     S/p flu 9. PE RISK FACTORS: "Do you have a history of blood clots?" (or: recent major surgery, recent prolonged travel, bedridden)     na 10. OTHER SYMPTOMS: "Do you have any other symptoms?" (e.g., runny nose, wheezing, chest pain)       Chest discomfort, cough productive worsen cough since starting tamiflu 11. PREGNANCY: "Is there any chance you are pregnant?" "When was your last menstrual period?"       na 12. TRAVEL: "Have you traveled out of the country in the last month?" (e.g., travel history, exposures)       na  Protocols used: Chest Pain-A-AH, Cough - Acute Productive-A-AH

## 2022-07-30 MED ORDER — PREDNISONE 10 MG PO TABS: ORAL_TABLET | ORAL | 0 refills | Status: DC

## 2022-07-30 NOTE — Telephone Encounter (Signed)
If he is wanting a chest x-ray, he is going to have to come in to be seen

## 2022-08-19 LAB — HM DIABETES EYE EXAM

## 2022-08-19 LAB — HEMOGLOBIN A1C: Hemoglobin A1C: 6

## 2022-12-07 ENCOUNTER — Telehealth: Payer: Self-pay

## 2022-12-07 NOTE — Telephone Encounter (Signed)
Copied from CRM [id number]. Topic: Appointment Scheduling - Scheduling Inquiry for Clinic >> Dec 07, 2022  1:38 PM Haroldine Laws wrote: Reason for CRM: pt called wanting to schedule a medicare physical for May 23   CB@  [phone]

## 2022-12-07 NOTE — Telephone Encounter (Signed)
Apt scheduled for 05/23 at 8:20.  Pt advised.   Thanks,   -Vernona Rieger

## 2023-01-14 ENCOUNTER — Ambulatory Visit (INDEPENDENT_AMBULATORY_CARE_PROVIDER_SITE_OTHER): Payer: BC Managed Care – PPO | Admitting: Internal Medicine

## 2023-01-14 ENCOUNTER — Encounter: Payer: Self-pay | Admitting: Internal Medicine

## 2023-01-14 VITALS — BP 134/82 | HR 91 | Temp 96.8°F | Ht 67.0 in | Wt 208.0 lb

## 2023-01-14 DIAGNOSIS — Z0001 Encounter for general adult medical examination with abnormal findings: Secondary | ICD-10-CM | POA: Diagnosis not present

## 2023-01-14 DIAGNOSIS — E6609 Other obesity due to excess calories: Secondary | ICD-10-CM | POA: Diagnosis not present

## 2023-01-14 DIAGNOSIS — Z125 Encounter for screening for malignant neoplasm of prostate: Secondary | ICD-10-CM

## 2023-01-14 DIAGNOSIS — Z794 Long term (current) use of insulin: Secondary | ICD-10-CM

## 2023-01-14 DIAGNOSIS — L600 Ingrowing nail: Secondary | ICD-10-CM | POA: Diagnosis not present

## 2023-01-14 DIAGNOSIS — Z6832 Body mass index (BMI) 32.0-32.9, adult: Secondary | ICD-10-CM

## 2023-01-14 DIAGNOSIS — Z8619 Personal history of other infectious and parasitic diseases: Secondary | ICD-10-CM

## 2023-01-14 LAB — CBC
Hemoglobin: 16.3 g/dL (ref 13.2–17.1)
MCHC: 33.4 g/dL (ref 32.0–36.0)
Platelets: 203 10*3/uL (ref 140–400)
RBC: 5.02 10*6/uL (ref 4.20–5.80)
RDW: 13.1 % (ref 11.0–15.0)

## 2023-01-14 MED ORDER — ACYCLOVIR 5 % EX OINT: 1.0000 | TOPICAL_OINTMENT | CUTANEOUS | 1 refills | Status: DC

## 2023-01-14 MED ORDER — VALACYCLOVIR HCL 1 G PO TABS: ORAL_TABLET | ORAL | 0 refills | Status: DC

## 2023-01-14 NOTE — Patient Instructions (Signed)
Health Maintenance After Age 71 After age 71, you are at a higher risk for certain long-term diseases and infections as well as injuries from falls. Falls are a major cause of broken bones and head injuries in people who are older than age 71. Getting regular preventive care can help to keep you healthy and well. Preventive care includes getting regular testing and making lifestyle changes as recommended by your health care provider. Talk with your health care provider about: Which screenings and tests you should have. A screening is a test that checks for a disease when you have no symptoms. A diet and exercise plan that is right for you. What should I know about screenings and tests to prevent falls? Screening and testing are the best ways to find a health problem early. Early diagnosis and treatment give you the best chance of managing medical conditions that are common after age 71. Certain conditions and lifestyle choices may make you more likely to have a fall. Your health care provider may recommend: Regular vision checks. Poor vision and conditions such as cataracts can make you more likely to have a fall. If you wear glasses, make sure to get your prescription updated if your vision changes. Medicine review. Work with your health care provider to regularly review all of the medicines you are taking, including over-the-counter medicines. Ask your health care provider about any side effects that may make you more likely to have a fall. Tell your health care provider if any medicines that you take make you feel dizzy or sleepy. Strength and balance checks. Your health care provider may recommend certain tests to check your strength and balance while standing, walking, or changing positions. Foot health exam. Foot pain and numbness, as well as not wearing proper footwear, can make you more likely to have a fall. Screenings, including: Osteoporosis screening. Osteoporosis is a condition that causes  the bones to get weaker and break more easily. Blood pressure screening. Blood pressure changes and medicines to control blood pressure can make you feel dizzy. Depression screening. You may be more likely to have a fall if you have a fear of falling, feel depressed, or feel unable to do activities that you used to do. Alcohol use screening. Using too much alcohol can affect your balance and may make you more likely to have a fall. Follow these instructions at home: Lifestyle Do not drink alcohol if: Your health care provider tells you not to drink. If you drink alcohol: Limit how much you have to: 0-1 drink a day for women. 0-2 drinks a day for men. Know how much alcohol is in your drink. In the [place] one drink equals one 12 oz bottle of beer (355 mL), one 5 oz glass of wine (148 mL), or one 1 oz glass of hard liquor (44 mL). Do not use any products that contain nicotine or tobacco. These products include cigarettes, chewing tobacco, and vaping devices, such as e-cigarettes. If you need help quitting, ask your health care provider. Activity  Follow a regular exercise program to stay fit. This will help you maintain your balance. Ask your health care provider what types of exercise are appropriate for you. If you need a cane or walker, use it as recommended by your health care provider. Wear supportive shoes that have nonskid soles. Safety  Remove any tripping hazards, such as rugs, cords, and clutter. Install safety equipment such as grab bars in bathrooms and safety rails on stairs. Keep rooms and walkways   well-lit. General instructions Talk with your health care provider about your risks for falling. Tell your health care provider if: You fall. Be sure to tell your health care provider about all falls, even ones that seem minor. You feel dizzy, tiredness (fatigue), or off-balance. Take over-the-counter and prescription medicines only as told by your health care provider. These include  supplements. Eat a healthy diet and maintain a healthy weight. A healthy diet includes low-fat dairy products, low-fat (lean) meats, and fiber from whole grains, beans, and lots of fruits and vegetables. Stay current with your vaccines. Schedule regular health, dental, and eye exams. Summary Having a healthy lifestyle and getting preventive care can help to protect your health and wellness after age 71. Screening and testing are the best way to find a health problem early and help you avoid having a fall. Early diagnosis and treatment give you the best chance for managing medical conditions that are more common for people who are older than age 71. Falls are a major cause of broken bones and head injuries in people who are older than age 71. Take precautions to prevent a fall at home. Work with your health care provider to learn what changes you can make to improve your health and wellness and to prevent falls. This information is not intended to replace advice given to you by your health care provider. Make sure you discuss any questions you have with your health care provider. Document Revised: 12/30/2020 Document Reviewed: 12/30/2020 Elsevier Patient Education  2023 [employer]  

## 2023-01-14 NOTE — Assessment & Plan Note (Signed)
RX for Acyclovir ointment and Valtrex 2 gm PO at first sign of outbreak

## 2023-01-14 NOTE — Assessment & Plan Note (Signed)
Encouraged diet and exercise for weight loss ?

## 2023-01-14 NOTE — Progress Notes (Signed)
Subjective:    Patient ID: Luke Coleman, male    DOB: 10-04-1950, 72 y.o.   MRN: [id number]  HPI  Patient presents to clinic today for his annual exam.  Flu: 07/2021 Tetanus: 04/2020 COVID: Pfizer x 2 Pneumovax: 06/2020 Prevnar: 10/2017 Shingrix: Never PSA screening: 09/2019 Colon screening: 04/2020 Vision screening: as needed Dentist: as needed  Diet: He does eat meat. He consumes fruits and veggies. He does eat some fried foods. He drinks mostly water and Dt. Soda. Exercise: None  Review of Systems  Past Medical History:  Diagnosis Date   Acute bacterial sinusitis 08/22/2014   COPD (chronic obstructive pulmonary disease) (HCC)    Diabetes mellitus without complication (HCC)    Enlarged prostate    Essential (primary) hypertension 03/01/2013   Overview:  Last Assessment & Plan:  Taking meds as prescribed. Check bmet. Continue to follow.    HTN (hypertension) 03/01/2013   Hypertension    Sleep apnea    on CPAP, USUALLY...CURRENTLY BROKEN    Current Outpatient Medications  Medication Sig Dispense Refill   albuterol (PROVENTIL) (2.5 MG/3ML) 0.083% nebulizer solution Take 2.5 mg by nebulization every 4 (four) hours as needed for wheezing.     amLODipine (NORVASC) 10 MG tablet Take 1 tablet (10 mg total) by mouth daily. 90 tablet 1   Dulaglutide 4.5 MG/0.5ML SOPN Inject 4.5 mg into the skin every 7 (seven) days.      FLOVENT HFA 44 MCG/ACT inhaler INHALE 2 PUFFS INTO THE LUNGS TWICE DAILY 10.6 g 1   fluticasone (FLONASE) 50 MCG/ACT nasal spray SHAKE LIQUID AND USE 2 SPRAYS IN EACH NOSTRIL DAILY 48 g 1   glucose blood (ONE TOUCH ULTRA TEST) test strip USE TO TEST THREE TIMES DAILY 100 each 11   HYDROcodone bit-homatropine (HYCODAN) 5-1.5 MG/5ML syrup Take 5 mLs by mouth every 8 (eight) hours as needed for cough. 120 mL 0   Insulin Pen Needle (BD PEN NEEDLE NANO 2ND GEN) 32G X 4 MM MISC Use 1 each once daily     Lancets 28G MISC 3 (three) times daily. Use as instructed. DX: E11.9      lisinopril (ZESTRIL) 40 MG tablet TAKE 1 TABLET(40 MG) BY MOUTH DAILY 90 tablet 0   oseltamivir (TAMIFLU) 75 MG capsule Take 1 capsule (75 mg total) by mouth 2 (two) times daily. For 5 days 10 capsule 0   predniSONE (DELTASONE) 10 MG tablet Take 6 tabs on day 1, 5 tabs on day 2, 4 tabs on day 3, 3 tabs on day 4, 2 tabs on day 5, 1 tab on day 6 21 tablet 0   No current facility-administered medications for this visit.    Allergies  Allergen Reactions   Adhesive [Tape]    Latex    Sulfa Antibiotics Rash    Penile rash    Family History  Problem Relation Age of Onset   Early death Mother        MVA - died age 88   Alcohol abuse Father    Hypertension Father    Cancer Father 47       renal cell carcinoma   Heart disease Father 59       MI - died   Diabetes Brother    Sleep apnea Brother    Sleep apnea Daughter    Sleep apnea Brother    Diabetes Maternal Uncle    Diabetes Paternal Aunt    Kidney disease Maternal Grandmother     Social History  Socioeconomic History   Marital status: Married    Spouse name: Luke Coleman   Number of children: 1   Years of education: 76   Highest education level: Master's degree (e.g., MA, MS, MEng, MEd, MSW, MBA)  Occupational History   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 grew up in [place], [place], [place]. He currently is living in [place], [place] with his wife of 2 months. He was married previously to his second wife for 29 years. Previous to that, he was married to his first wife for 7 years and has 1 daughter from this marriage  (9 y/o). He widowed in 2013 (second wife). 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 He obtained a Scientist, water quality in Programme researcher, broadcasting/film/video, Two Geophysical data processor from [employer] 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 He enjoys baseball. Enjoys the outdoors. He also enjoys watching old movies. He used to be a Psychologist, occupational for a Health and safety inspector.   Social Determinants of Health   Financial Resource Strain: Low Risk  (11/05/2017)   Overall Financial Resource Strain (CARDIA)    Difficulty of Paying Living Expenses: Not hard at all  Food Insecurity: No Food Insecurity (11/05/2017)   Hunger Vital Sign    Worried About Running Out of Food in the Last Year: Never true    Ran Out of Food in the Last Year: Never true  Transportation Needs: No Transportation Needs (11/05/2017)   PRAPARE - Administrator, Civil Service (Medical): No    Lack of Transportation (Non-Medical): No  Physical Activity: Unknown (11/05/2017)   Exercise Vital Sign    Days of Exercise per Week: 0 days    Minutes of Exercise per Session: Not on file  Stress: Not on file  Social Connections: Not on file  Intimate Partner Violence: Not At Risk (11/05/2017)   Humiliation, Afraid, Rape, and Kick questionnaire    Fear of Current or Ex-Partner: No    Emotionally Abused: No    Physically Abused: No    Sexually Abused: No     Constitutional: Denies fever, malaise, fatigue, headache or abrupt weight changes.  HEENT: Denies eye pain, eye redness, ear pain, ringing in the ears, wax buildup, runny nose, nasal congestion, bloody nose, or sore throat. Respiratory: Denies difficulty breathing, shortness of breath, cough or sputum production.   Cardiovascular: Denies chest pain, chest tightness, palpitations or swelling in the hands or feet.  Gastrointestinal: Pt reports intermittent diarrhea. Denies abdominal pain,  bloating, constipation, diarrhea or blood in the stool.  GU: Patient reports erectile dysfunction.  Denies urgency, frequency, pain with urination, burning sensation, blood in urine, odor or discharge. Musculoskeletal: Patient reports chronic left ankle pain.  Denies decrease in range of motion, difficulty with gait, muscle pain or joint swelling.  Skin: Pt reports ingrown toenails, history of cold sores. Denies redness, rashes, lesions or ulcercations.  Neurological: Denies dizziness, difficulty with memory, difficulty with  speech or problems with balance and coordination.  Psych: Denies anxiety, depression, SI/HI.  No other specific complaints in a complete review of systems (except as listed in HPI above).     Objective:   Physical Exam   BP 134/82 (BP Location: Left Arm, Patient Position: Sitting, Cuff Size: Normal)   Pulse 91   Temp (!) 96.8 F (36 C) (Temporal)   Ht 5\' 7"  (1.702 m)   Wt 208 lb (94.3 kg)   SpO2 96%   BMI 32.58 kg/m   Wt Readings from Last 3 Encounters:  07/27/22 208 lb (94.3 kg)  07/10/21 213 lb 9.6 oz (96.9 kg)  03/11/21 212 lb 12.8 oz (96.5 kg)    General: Appears his stated age, obese in NAD. Skin: Warm, dry and intact. No ulcerations noted. HEENT: Head: normal shape and size; Eyes: sclera white, no icterus, conjunctiva pink, PERRLA and EOMs intact;  Neck:  Neck supple, trachea midline. No masses, lumps or thyromegaly present.  Cardiovascular: Normal rate and rhythm. S1,S2 noted.  No murmur, rubs or gallops noted. No JVD or BLE edema. No carotid bruits noted. Pulmonary/Chest: Normal effort and positive vesicular breath sounds. No respiratory distress. No wheezes, rales or ronchi noted.  Abdomen: Soft and nontender. Normal bowel sounds.  Musculoskeletal: Strength 5/5 BUE/BLE. No difficulty with gait.  Neurological: Alert and oriented. Cranial nerves II-XII grossly intact. Coordination normal.  Psychiatric: Mood and affect normal. Behavior is normal.  Judgment and thought content normal.    BMET    Component Value Date/Time   NA 135 03/21/2020 0917   K 3.8 03/21/2020 0917   CL 101 03/21/2020 0917   CO2 22 03/21/2020 0917   GLUCOSE 263 (H) 03/21/2020 0917   BUN 28 (H) 03/21/2020 0917   CREATININE 1.66 (H) 03/21/2020 0917   CREATININE 1.30 (H) 06/02/2018 0915   CALCIUM 9.0 03/21/2020 0917   GFRNONAA 42 (L) 03/21/2020 0917   GFRNONAA 57 (L) 06/02/2018 0915   GFRAA 48 (L) 03/21/2020 0917   GFRAA 66 06/02/2018 0915    Lipid Panel     Component Value Date/Time   CHOL 134 06/02/2018 0915   TRIG 157 (H) 06/02/2018 0915   HDL 34 (L) 06/02/2018 0915   CHOLHDL 3.9 06/02/2018 0915   LDLCALC 74 06/02/2018 0915    CBC    Component Value Date/Time   WBC 7.1 03/21/2020 0917   RBC 4.86 03/21/2020 0917   HGB 16.1 03/21/2020 0917   HCT 43.5 03/21/2020 0917   PLT 180 03/21/2020 0917   MCV 89.5 03/21/2020 0917   MCH 33.1 03/21/2020 0917   MCHC 37.0 (H) 03/21/2020 0917   RDW 12.9 03/21/2020 0917   LYMPHSABS 1,792 06/28/2018 1048   MONOABS 0.5 07/28/2016 0759   EOSABS 168 06/28/2018 1048   BASOSABS 28 06/28/2018 1048    Hgb A1C Lab Results  Component Value Date   HGBA1C 6.9 07/10/2020           Assessment & Plan:   Preventative Health Maintenance:  Encouraged him to get a flu shot in the fall Tetanus UTD Encourage him to get his COVID booster Pneumovax and Prevnar UTD Discussed Shingrix vaccine, he will check coverage with his insurance company and schedule visit if he would like to have this done Colon screening UTD Encouraged him to consume a balanced diet and exercise regimen Advised him to see an eye doctor and dentist annually We will check CBC, c-Met, lipid, A1c, urine microalbumin and PSA today  Ingrown Toenails:  He declines referral to podiatry at this time  RTC in 6 months, follow-up chronic conditions Nicki Reaper, NP

## 2023-01-15 ENCOUNTER — Encounter: Payer: Self-pay | Admitting: Internal Medicine

## 2023-01-15 LAB — MICROALBUMIN / CREATININE URINE RATIO
Creatinine, Urine: 278 mg/dL (ref 20–320)
Microalb Creat Ratio: 22 mg/g creat (ref <30)
Microalb, Ur: 6.2 mg/dL

## 2023-01-15 LAB — LIPID PANEL
Cholesterol: 113 mg/dL (ref <200)
HDL: 39 mg/dL — ABNORMAL LOW (ref >=40)
LDL Cholesterol (Calc): 59 mg/dL (calc)
Non-HDL Cholesterol (Calc): 74 mg/dL (calc) (ref <130)
Total CHOL/HDL Ratio: 2.9 (calc) (ref <5.0)
Triglycerides: 70 mg/dL (ref <150)

## 2023-01-15 LAB — COMPLETE METABOLIC PANEL WITH GFR
AG Ratio: 2 (calc) (ref 1.0–2.5)
ALT: 27 U/L (ref 9–46)
AST: 20 U/L (ref 10–35)
Albumin: 4.7 g/dL (ref 3.6–5.1)
Alkaline phosphatase (APISO): 52 U/L (ref 35–144)
BUN: 23 mg/dL (ref 7–25)
CO2: 26 mmol/L (ref 20–32)
Calcium: 9.2 mg/dL (ref 8.6–10.3)
Chloride: 106 mmol/L (ref 98–110)
Creat: 1.21 mg/dL (ref 0.70–1.28)
Globulin: 2.4 g/dL (calc) (ref 1.9–3.7)
Glucose, Bld: 98 mg/dL (ref 65–99)
Potassium: 4.5 mmol/L (ref 3.5–5.3)
Sodium: 142 mmol/L (ref 135–146)
Total Bilirubin: 0.8 mg/dL (ref 0.2–1.2)
Total Protein: 7.1 g/dL (ref 6.1–8.1)
eGFR: 64 mL/min/{1.73_m2} (ref >=60)

## 2023-01-15 LAB — HEMOGLOBIN A1C
Hgb A1c MFr Bld: 6 % of total Hgb — ABNORMAL HIGH (ref <5.7)
Mean Plasma Glucose: 126 mg/dL
eAG (mmol/L): 7 mmol/L

## 2023-01-15 LAB — CBC
HCT: 48.8 % (ref 38.5–50.0)
MCH: 32.5 pg (ref 27.0–33.0)
MCV: 97.2 fL (ref 80.0–100.0)
MPV: 10.9 fL (ref 7.5–12.5)
WBC: 7.7 10*3/uL (ref 3.8–10.8)

## 2023-01-15 LAB — PSA: PSA: 0.29 ng/mL (ref <=4.00)

## 2023-03-19 ENCOUNTER — Ambulatory Visit: Payer: Self-pay

## 2023-03-19 NOTE — Telephone Encounter (Signed)
Message from Kenton P sent at 03/19/2023  3:34 PM EDT  Summary: covid question   Pt called saying his wife just tested positive for covid and he has some of the symptoms.   He started with a sore throat and body aches.  Please advise  [phone]         Chief Complaint: nasal congestion  Symptoms: sore throat, nausea Frequency: today with sx- wife sx and sx with covid Pertinent Negatives: Patient denies fever, breathing difficulty, runny nose Disposition: [] ED /[x] Urgent Care (no appt availability in office) / [] Appointment(In office/virtual)/ []  [facility]/ [] Home Care/ [] Refused Recommended Disposition /[] [facility]/ []  Follow-up with PCP Additional Notes: sent Care advice to pt via MyChart. No appt's in office called to see if can fit in and was advised only 1 provider and he is booked. Offered to make a MyChart virtual appt - pt stated he doesn't have a phone to do that type of visit. Called pt back and advised UC. Pt verbalized understanding. Advised pt to leave work now and to don a mask.   Reason for Disposition  [1] COVID-19 infection suspected by caller or triager AND [2] mild symptoms (cough, fever, or others) AND [3] has not gotten tested yet  Answer Assessment - Initial Assessment Questions 1. COVID-19 DIAGNOSIS: "How do you know that you have COVID?" (e.g., positive lab test or self-test, diagnosed by doctor or NP/PA, symptoms after exposure).     Wife covid + 2. COVID-19 EXPOSURE: "Was there any known exposure to COVID before the symptoms began?" CDC Definition of close contact: within 6 feet (2 meters) for a total of 15 minutes or more over a 24-hour period.      yes 3. ONSET: "When did the COVID-19 symptoms start?"      This am  4. WORST SYMPTOM: "What is your worst symptom?" (e.g., cough, fever, shortness of breath, muscle aches)     Nasal congestion and sore throat 5. COUGH: "Do you have a cough?" If Yes, ask: "How bad is the cough?"        No  6. FEVER: "Do you have a fever?" If Yes, ask: "What is your temperature, how was it measured, and when did it start?"     Hot all day denies fever 7. RESPIRATORY STATUS: "Describe your breathing?" (e.g., normal; shortness of breath, wheezing, unable to speak)      normal  9. OTHER SYMPTOMS: "Do you have any other symptoms?"  (e.g., chills, fatigue, headache, loss of smell or taste, muscle pain, sore throat)     Nauseated, throat, nasal congestion right side > left  10. HIGH RISK DISEASE: "Do you have any chronic medical problems?" (e.g., asthma, heart or lung disease, weak immune system, obesity, etc.)       Elderly, diabetes and HTN  Protocols used: Coronavirus (COVID-19) Diagnosed or Suspected-A-AH

## 2023-07-21 ENCOUNTER — Ambulatory Visit (INDEPENDENT_AMBULATORY_CARE_PROVIDER_SITE_OTHER): Payer: BC Managed Care – PPO | Admitting: Family Medicine

## 2023-07-21 ENCOUNTER — Encounter: Payer: Self-pay | Admitting: Family Medicine

## 2023-07-21 ENCOUNTER — Ambulatory Visit: Payer: Self-pay

## 2023-07-21 VITALS — BP 132/70 | Temp 98.1°F | Ht 67.0 in | Wt 205.8 lb

## 2023-07-21 DIAGNOSIS — J0110 Acute frontal sinusitis, unspecified: Secondary | ICD-10-CM

## 2023-07-21 MED ORDER — IPRATROPIUM BROMIDE 0.06 % NA SOLN: 2.0000 | Freq: Four times a day (QID) | NASAL | 0 refills | Status: DC

## 2023-07-21 MED ORDER — AMOXICILLIN-POT CLAVULANATE 875-125 MG PO TABS: 1.0000 | ORAL_TABLET | Freq: Two times a day (BID) | ORAL | 0 refills | Status: DC

## 2023-07-21 NOTE — Patient Instructions (Addendum)
Thank you for coming to the office today.  Likely sinusitis infection  Start Augmentin antibiotic twice a day  Start Atrovent nasal spray decongestant 2 sprays in each nostril up to 4 times daily for 7 days   Please schedule a Follow-up Appointment to: Return if symptoms worsen or fail to improve.  If you have any other questions or concerns, please feel free to call the office or send a message through MyChart. You may also schedule an earlier appointment if necessary.  Additionally, you may be receiving a survey about your experience at our office within a few days to 1 week by e-mail or mail. We value your feedback.  Saralyn Pilar, DO [facility], [place]

## 2023-07-21 NOTE — Progress Notes (Signed)
Subjective:    Patient ID: Luke Coleman, male    DOB: 18-Aug-1951, 72 y.o.   MRN: [id number]  Luke Coleman is a 72 y.o. male presenting on 07/21/2023 for Sinus Problem (Since Sunday - sinus drainage, left ear pressure )  Patient presents for a same day appointment.  PCP Nicki Reaper, FNP   HPI  Discussed the use of AI scribe software for clinical note transcription with the patient, who gave verbal consent to proceed.  The patient, a 72 year old retiree with a history of allergies, presented with worsening sinus symptoms that began on Sunday. He reported taking generic Zyrtec for his allergies, but noticed an increase in sinus pressure and congestion despite this. The patient also started using a nasal spray, but it did not alleviate the symptoms. He described the discomfort as primarily located in the sinuses, with some associated ear discomfort. The patient also noted a change in the color and consistency of his nasal discharge, which had turned green and thickened, suggesting a possible sinus infection. He denied any known exposure to sick individuals. Despite the sinus symptoms, the patient did not report any respiratory symptoms such as cough or shortness of breath. He also denied any symptoms suggestive of a systemic infection such as fever or malaise. The patient expressed concern about his symptoms worsening over the upcoming holiday period.        Health Maintenance: Up to date Flu vaccine     01/14/2023    9:09 AM 07/27/2022    9:01 AM 03/11/2021   10:58 AM  Depression screen PHQ 2/9  Decreased Interest 1 0 1  Down, Depressed, Hopeless 0 1 1  PHQ - 2 Score 1 1 2   Altered sleeping   1  Tired, decreased energy   2  Change in appetite   2  Feeling bad or failure about yourself    0  Trouble concentrating   0  Moving slowly or fidgety/restless   0  Suicidal thoughts   0  PHQ-9 Score   7  Difficult doing work/chores   Not difficult at all       01/14/2023    9:09 AM  07/27/2022    9:01 AM 03/11/2021   10:59 AM  GAD 7 : Generalized Anxiety Score  Nervous, Anxious, on Edge 0 0 0  Control/stop worrying 0 0 0  Worry too much - different things 0 0 0  Trouble relaxing 0 0 0  Restless 1 1 0  Easily annoyed or irritable 0 0 0  Afraid - awful might happen 0 0 0  Total GAD 7 Score 1 1 0  Anxiety Difficulty Not difficult at all Not difficult at all Not difficult at all    Social History   Tobacco Use   Smoking status: Never   Smokeless tobacco: Never  Vaping Use   Vaping status: Never Used  Substance Use Topics   Alcohol use: Yes    Alcohol/week: 0.0 standard drinks of alcohol    Comment: Occasional   Drug use: No    Review of Systems Per HPI unless specifically indicated above     Objective:    BP 132/70   Temp 98.1 F (36.7 C)   Ht 5\' 7"  (1.702 m)   Wt 205 lb 12.8 oz (93.4 kg)   BMI 32.23 kg/m   Wt Readings from Last 3 Encounters:  07/21/23 205 lb 12.8 oz (93.4 kg)  01/14/23 208 lb (94.3 kg)  07/27/22 208 lb (94.3 kg)  Physical Exam Vitals and nursing note reviewed.  Constitutional:      General: He is not in acute distress.    Appearance: He is well-developed. He is not diaphoretic.     Comments: Well-appearing, comfortable, cooperative  HENT:     Head: Normocephalic and atraumatic.     Right Ear: Ear canal and external ear normal. There is no impacted cerumen.     Left Ear: Ear canal and external ear normal. There is no impacted cerumen.     Ears:     Comments: Bilateral TM with some hazy effusion without erythema, no bulging    Nose: Congestion present.     Mouth/Throat:     Mouth: Mucous membranes are moist.     Pharynx: Oropharynx is clear. No oropharyngeal exudate or posterior oropharyngeal erythema.  Eyes:     General:        Right eye: No discharge.        Left eye: No discharge.     Conjunctiva/sclera: Conjunctivae normal.  Neck:     Thyroid: No thyromegaly.  Cardiovascular:     Rate and Rhythm: Normal rate  and regular rhythm.     Pulses: Normal pulses.     Heart sounds: Normal heart sounds. No murmur heard. Pulmonary:     Effort: Pulmonary effort is normal. No respiratory distress.     Breath sounds: Normal breath sounds. No wheezing or rales.  Musculoskeletal:        General: Normal range of motion.     Cervical back: Normal range of motion and neck supple.  Lymphadenopathy:     Cervical: No cervical adenopathy.  Skin:    General: Skin is warm and dry.     Findings: No erythema or rash.  Neurological:     Mental Status: He is alert and oriented to person, place, and time. Mental status is at baseline.  Psychiatric:        Behavior: Behavior normal.     Comments: Well groomed, good eye contact, normal speech and thoughts     Results for orders placed or performed in visit on 01/14/23  CBC  Result Value Ref Range   WBC 7.7 3.8 - 10.8 Thousand/uL   RBC 5.02 4.20 - 5.80 Million/uL   Hemoglobin 16.3 13.2 - 17.1 g/dL   HCT 40.9 81.1 - 91.4 %   MCV 97.2 80.0 - 100.0 fL   MCH 32.5 27.0 - 33.0 pg   MCHC 33.4 32.0 - 36.0 g/dL   RDW 78.2 95.6 - 21.3 %   Platelets 203 140 - 400 Thousand/uL   MPV 10.9 7.5 - 12.5 fL  COMPLETE METABOLIC PANEL WITH GFR  Result Value Ref Range   Glucose, Bld 98 65 - 99 mg/dL   BUN 23 7 - 25 mg/dL   Creat 0.86 5.78 - 4.69 mg/dL   eGFR 64 > OR = 60 GE/XBM/8.41L2   BUN/Creatinine Ratio SEE NOTE: 6 - 22 (calc)   Sodium 142 135 - 146 mmol/L   Potassium 4.5 3.5 - 5.3 mmol/L   Chloride 106 98 - 110 mmol/L   CO2 26 20 - 32 mmol/L   Calcium 9.2 8.6 - 10.3 mg/dL   Total Protein 7.1 6.1 - 8.1 g/dL   Albumin 4.7 3.6 - 5.1 g/dL   Globulin 2.4 1.9 - 3.7 g/dL (calc)   AG Ratio 2.0 1.0 - 2.5 (calc)   Total Bilirubin 0.8 0.2 - 1.2 mg/dL   Alkaline phosphatase (APISO) 52 35 - 144 U/L  AST 20 10 - 35 U/L   ALT 27 9 - 46 U/L  Lipid panel  Result Value Ref Range   Cholesterol 113 <200 mg/dL   HDL 39 (L) > OR = 40 mg/dL   Triglycerides 70 <578 mg/dL   LDL  Cholesterol (Calc) 59 mg/dL (calc)   Total CHOL/HDL Ratio 2.9 <5.0 (calc)   Non-HDL Cholesterol (Calc) 74 <469 mg/dL (calc)  Hemoglobin G2X  Result Value Ref Range   Hgb A1c MFr Bld 6.0 (H) <5.7 % of total Hgb   Mean Plasma Glucose 126 mg/dL   eAG (mmol/L) 7.0 mmol/L  Microalbumin / creatinine urine ratio  Result Value Ref Range   Creatinine, Urine 278 20 - 320 mg/dL   Microalb, Ur 6.2 mg/dL   Microalb Creat Ratio 22 <30 mg/g creat  PSA  Result Value Ref Range   PSA 0.29 < OR = 4.00 ng/mL  Hemoglobin A1c  Result Value Ref Range   Hemoglobin A1C 6.0   HM DIABETES EYE EXAM  Result Value Ref Range   HM Diabetic Eye Exam No Retinopathy No Retinopathy      Assessment & Plan:   Problem List Items Addressed This Visit   None Visit Diagnoses     Acute non-recurrent frontal sinusitis    -  Primary   Relevant Medications   cetirizine (ZYRTEC) 10 MG tablet   amoxicillin-clavulanate (AUGMENTIN) 875-125 MG tablet   ipratropium (ATROVENT) 0.06 % nasal spray        Sinusitis Symptoms started on Sunday with worsening congestion and greenish nasal discharge. No signs of ear infection or lung involvement. Patient has been using over-the-counter allergy medication and nasal spray with limited relief. -Prescribe Augmentin (amoxicillin) twice daily for 10 days. -Prescribe Atrovent nasal spray up to four times daily for 7 days. -Advise patient to continue with current over-the-counter allergy medication and nasal spray. -Consider over-the-counter Mucinex or Sudafed for additional congestion relief. -Follow-up as needed if symptoms worsen or do not improve with treatment.         No orders of the defined types were placed in this encounter.   Meds ordered this encounter  Medications   amoxicillin-clavulanate (AUGMENTIN) 875-125 MG tablet    Sig: Take 1 tablet by mouth 2 (two) times daily.    Dispense:  20 tablet    Refill:  0   ipratropium (ATROVENT) 0.06 % nasal spray    Sig:  Place 2 sprays into both nostrils 4 (four) times daily. For up to 5-7 days then stop.    Dispense:  15 mL    Refill:  0    Follow up plan: Return if symptoms worsen or fail to improve.    Saralyn Pilar, DO [facility] 07/21/2023, 10:56 AM

## 2023-07-21 NOTE — Telephone Encounter (Signed)
Summary: Possible sinus infection, no appt   Pt has a possible sinus infection, no cough, sinus drainage, symptomatic since Sunday.         Chief Complaint: Green sinus drainage,chills. No availability, declines [facility]. Asking "for something to be called in." Symptoms: Above Frequency: Sunday Pertinent Negatives: Patient denies any other symptoms. Disposition: [] ED /[] Urgent Care (no appt availability in office) / [] Appointment(In office/virtual)/ []  [facility]/ [] Home Care/ [] Refused Recommended Disposition /[] [facility]/ [x]  Follow-up with PCP Additional Notes: Please advise pt.  Reason for Disposition  [1] Sinus congestion (pressure, fullness) AND [2] present > 10 days  Answer Assessment - Initial Assessment Questions 1. LOCATION: "Where does it hurt?"      Face 2. ONSET: "When did the sinus pain start?"  (e.g., hours, days)      Sunday 3. SEVERITY: "How bad is the pain?"   (Scale 1-10; mild, moderate or severe)   - MILD (1-3): doesn't interfere with normal activities    - MODERATE (4-7): interferes with normal activities (e.g., work or school) or awakens from sleep   - SEVERE (8-10): excruciating pain and patient unable to do any normal activities        None now 4. RECURRENT SYMPTOM: "Have you ever had sinus problems before?" If Yes, ask: "When was the last time?" and "What happened that time?"      Yes 5. NASAL CONGESTION: "Is the nose blocked?" If Yes, ask: "Can you open it or must you breathe through your mouth?"     Yes 6. NASAL DISCHARGE: "Do you have discharge from your nose?" If so ask, "What color?"     Green 7. FEVER: "Do you have a fever?" If Yes, ask: "What is it, how was it measured, and when did it start?"      No 8. OTHER SYMPTOMS: "Do you have any other symptoms?" (e.g., sore throat, cough, earache, difficulty breathing)     No 9. PREGNANCY: "Is there any chance you are pregnant?" "When was your last menstrual period?"      N/a  Protocols used: Sinus Pain or Congestion-A-AH

## 2023-07-30 ENCOUNTER — Ambulatory Visit: Payer: BC Managed Care – PPO | Admitting: Internal Medicine

## 2023-08-16 ENCOUNTER — Ambulatory Visit: Payer: BC Managed Care – PPO | Admitting: Internal Medicine

## 2023-08-26 LAB — HEMOGLOBIN A1C: Hemoglobin A1C: 5.3

## 2023-09-30 ENCOUNTER — Encounter: Payer: Self-pay | Admitting: Internal Medicine

## 2023-10-01 ENCOUNTER — Ambulatory Visit: Payer: Managed Care, Other (non HMO) | Admitting: Internal Medicine

## 2023-10-01 ENCOUNTER — Encounter: Payer: Self-pay | Admitting: Internal Medicine

## 2023-10-01 ENCOUNTER — Ambulatory Visit (INDEPENDENT_AMBULATORY_CARE_PROVIDER_SITE_OTHER): Payer: Medicare Other | Admitting: Internal Medicine

## 2023-10-01 VITALS — BP 132/70 | HR 88 | Temp 97.8°F | Ht 67.0 in | Wt 201.4 lb

## 2023-10-01 DIAGNOSIS — J069 Acute upper respiratory infection, unspecified: Secondary | ICD-10-CM | POA: Diagnosis not present

## 2023-10-01 DIAGNOSIS — Z20828 Contact with and (suspected) exposure to other viral communicable diseases: Secondary | ICD-10-CM | POA: Diagnosis not present

## 2023-10-01 LAB — POC COVID19/FLU A&B COMBO
Covid Antigen, POC: NEGATIVE
Influenza A Antigen, POC: NEGATIVE
Influenza B Antigen, POC: NEGATIVE

## 2023-10-01 MED ORDER — ALBUTEROL SULFATE (2.5 MG/3ML) 0.083% IN NEBU
2.5000 mg | INHALATION_SOLUTION | RESPIRATORY_TRACT | 0 refills | Status: DC | PRN
Start: 2023-10-01 — End: 2023-12-07

## 2023-10-01 MED ORDER — OSELTAMIVIR PHOSPHATE 75 MG PO CAPS: 75.0000 mg | ORAL_CAPSULE | Freq: Two times a day (BID) | ORAL | 0 refills | Status: DC

## 2023-10-01 NOTE — Progress Notes (Signed)
 Subjective:    Patient ID: Luke Coleman, male    DOB: August 02, 1951, 73 y.o.   MRN: [id number]  HPI   Discussed the use of AI scribe software for clinical note transcription with the patient, who gave verbal consent to proceed.  History of Present Illness   Luke Coleman is a 73 year old male who presents with flu-like symptoms.   He has been experiencing flu-like symptoms since yesterday, including a dull headache, generalized myalgias, nasal congestion, and rhinorrhea. His throat is mildly sore, and he describes a sensation of ear fullness. He denies cough and shortness of breath. He is unsure about having a fever but reports chills and disrupted sleep, waking up feeling alternately hot and cold.  He began experiencing diarrhea last night, which he has not associated with his current illness.  He has been self-medicating with DayQuil, NyQuil, and Sudafed since his wife, Darice, came home sick with symptoms for 2 weeks although she was never tested for the flu. He works at [employer], where there is a reported prevalence of flu.       Review of Systems   Past Medical History:  Diagnosis Date   Acute bacterial sinusitis 08/22/2014   COPD (chronic obstructive pulmonary disease) (HCC)    Diabetes mellitus without complication (HCC)    Enlarged prostate    Essential (primary) hypertension 03/01/2013   Overview:  Last Assessment & Plan:  Taking meds as prescribed. Check bmet. Continue to follow.    HTN (hypertension) 03/01/2013   Hypertension    Sleep apnea    on CPAP, USUALLY...CURRENTLY BROKEN    Current Outpatient Medications  Medication Sig Dispense Refill   acyclovir  ointment (ZOVIRAX ) 5 % Apply 1 Application topically every 3 (three) hours. (Patient not taking: Reported on 07/21/2023) 15 g 1   albuterol  (PROVENTIL ) (2.5 MG/3ML) 0.083% nebulizer solution Take 2.5 mg by nebulization every 4 (four) hours as needed for wheezing.     amLODipine  (NORVASC ) 10 MG tablet Take 1  tablet (10 mg total) by mouth daily. (Patient not taking: Reported on 07/21/2023) 90 tablet 1   amoxicillin -clavulanate (AUGMENTIN ) 875-125 MG tablet Take 1 tablet by mouth 2 (two) times daily. 20 tablet 0   cetirizine (ZYRTEC) 10 MG tablet Take 10 mg by mouth daily.     Dulaglutide 4.5 MG/0.5ML SOPN Inject 4.5 mg into the skin every 7 (seven) days.      FLOVENT  HFA 44 MCG/ACT inhaler INHALE 2 PUFFS INTO THE LUNGS TWICE DAILY (Patient not taking: Reported on 07/21/2023) 10.6 g 1   fluticasone  (FLONASE ) 50 MCG/ACT nasal spray SHAKE LIQUID AND USE 2 SPRAYS IN EACH NOSTRIL DAILY (Patient not taking: Reported on 07/21/2023) 48 g 1   glucose blood (ONE TOUCH ULTRA TEST) test strip USE TO TEST THREE TIMES DAILY 100 each 11   Insulin  Pen Needle (BD PEN NEEDLE NANO 2ND GEN) 32G X 4 MM MISC Use 1 each once daily     ipratropium (ATROVENT ) 0.06 % nasal spray Place 2 sprays into both nostrils 4 (four) times daily. For up to 5-7 days then stop. 15 mL 0   Lancets 28G MISC 3 (three) times daily. Use as instructed. DX: E11.9     lisinopril  (ZESTRIL ) 40 MG tablet TAKE 1 TABLET(40 MG) BY MOUTH DAILY 90 tablet 0   MOUNJARO 10 MG/0.5ML Pen Inject 2 mLs into the skin once a week.     SEMGLEE , YFGN, 100 UNIT/ML Pen 50 Units.     valACYclovir  (VALTREX ) 1000 MG  tablet Take 2 tabs PO x 1 when you have a cold sore 20 tablet 0   No current facility-administered medications for this visit.    Allergies  Allergen Reactions   Adhesive [Tape]    Latex    Silicone Other (See Comments)    Pt states that it rips his skin.   Sulfa  Antibiotics Rash    Penile rash    Family History  Problem Relation Age of Onset   Early death Mother        MVA - died age 59   Alcohol abuse Father    Hypertension Father    Cancer Father 33       renal cell carcinoma   Heart disease Father 21       MI - died   Diabetes Brother    Sleep apnea Brother    Sleep apnea Daughter    Sleep apnea Brother    Diabetes Maternal Uncle     Diabetes Paternal Aunt    Kidney disease Maternal Grandmother     Social History   Socioeconomic History   Marital status: Married    Spouse name: [name]   Number of children: 1   Years of education: 18   Highest education level: Master's degree (e.g., MA, MS, MEng, MEd, MSW, MBA)  Occupational History   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 Arlotta grew up in [place], [place], [place]. He currently is living in [place], [place] with his wife of 2 months. He was married previously to his second wife for 29 years. Previous to that, he was married to his first wife for 7 years and has 1 daughter from this marriage (79 y/o). He widowed in 2013 (second wife). 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 He obtained a Scientist, Water Quality in Programme Researcher, Broadcasting/film/video, Two Geophysical Data Processor from [employer] 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 He enjoys baseball. Enjoys the outdoors. He also enjoys watching old movies. He used to be a psychologist, occupational for a health and safety inspector.   Social Drivers of Health   Financial Resource Strain: Low Risk  (11/05/2017)   Overall Financial Resource Strain (CARDIA)    Difficulty of Paying Living Expenses: Not hard at all  Food Insecurity: No Food Insecurity (11/05/2017)   Hunger Vital Sign    Worried About Running Out of Food in the Last Year: Never true    Ran Out of Food in the Last Year: Never true  Transportation Needs: No Transportation Needs (11/05/2017)   PRAPARE  - Administrator, Civil Service (Medical): No    Lack of Transportation (Non-Medical): No  Physical Activity: Unknown (11/05/2017)   Exercise Vital Sign    Days of Exercise per Week: 0 days    Minutes of Exercise per Session: Not on file  Stress: Not on file  Social Connections: Not on file  Intimate Partner Violence: Not At Risk (11/05/2017)  Humiliation, Afraid, Rape, and Kick questionnaire    Fear of Current or Ex-Partner: No    Emotionally Abused: No    Physically Abused: No    Sexually Abused: No     Constitutional: Pt reports headache. Denies fever, malaise, fatigue, or abrupt weight changes.  HEENT: Pt reports runny nose, nasal congestion, ear fullness, and sore throat. Denies eye pain, eye redness, ear pain, ringing in the ears, wax buildup, bloody nose. Respiratory: Denies difficulty breathing, shortness of breath, cough or sputum production.   Cardiovascular: Denies chest pain, chest tightness, palpitations or swelling in the hands or feet.  Gastrointestinal: Pt reports diarrhea. Denies abdominal pain, bloating, constipation, or blood in the stool.  GU: Denies urgency, frequency, pain with urination, burning sensation, blood in urine, odor or discharge. Musculoskeletal: Pt reports body aches.Denies decrease in range of motion, difficulty with gait, muscle pain or joint pain and swelling.  Skin: Denies redness, rashes, lesions or ulcercations.  Neurological: Denies dizziness, difficulty with memory, difficulty with speech or problems with balance and coordination.  Psych: Denies anxiety, depression, SI/HI.  No other specific complaints in a complete review of systems (except as listed in HPI above).      Objective:   Physical Exam  BP 132/70 (BP Location: Left Arm, Patient Position: Sitting, Cuff Size: Normal)   Pulse 88   Temp 97.8 F (36.6 C)   Ht 5' 7 (1.702 m)   Wt 201 lb 6.4 oz (91.4 kg)   SpO2 98%   BMI 31.54 kg/m   Wt Readings from Last 3  Encounters:  07/21/23 205 lb 12.8 oz (93.4 kg)  01/14/23 208 lb (94.3 kg)  07/27/22 208 lb (94.3 kg)    General: Appears his stated age, obese, in NAD. Skin: Warm, dry and intact. No rashes noted. HEENT: Head: normal shape and size, no sinus tenderness noted; Eyes: sclera white, no icterus, conjunctiva pink, PERRLA and EOMs intact; Ears: Tm's gray and intact, normal light reflex; Nose: mucosa pink and moist, septum midline; Throat/Mouth: Teeth present, mucosa pink and moist, + PND, no exudate, lesions or ulcerations noted.  Neck: No adenopathy noted. Cardiovascular: Normal rate and rhythm.  Pulmonary/Chest: Normal effort and positive vesicular breath sounds. No respiratory distress. No wheezes, rales or ronchi noted.  Neurological: Alert and oriented.   BMET    Component Value Date/Time   NA 142 01/14/2023 0849   K 4.5 01/14/2023 0849   CL 106 01/14/2023 0849   CO2 26 01/14/2023 0849   GLUCOSE 98 01/14/2023 0849   BUN 23 01/14/2023 0849   CREATININE 1.21 01/14/2023 0849   CALCIUM  9.2 01/14/2023 0849   GFRNONAA 42 (L) 03/21/2020 0917   GFRNONAA 57 (L) 06/02/2018 0915   GFRAA 48 (L) 03/21/2020 0917   GFRAA 66 06/02/2018 0915    Lipid Panel     Component Value Date/Time   CHOL 113 01/14/2023 0849   TRIG 70 01/14/2023 0849   HDL 39 (L) 01/14/2023 0849   CHOLHDL 2.9 01/14/2023 0849   LDLCALC 59 01/14/2023 0849    CBC    Component Value Date/Time   WBC 7.7 01/14/2023 0849   RBC 5.02 01/14/2023 0849   HGB 16.3 01/14/2023 0849   HCT 48.8 01/14/2023 0849   PLT 203 01/14/2023 0849   MCV 97.2 01/14/2023 0849   MCH 32.5 01/14/2023 0849   MCHC 33.4 01/14/2023 0849   RDW 13.1 01/14/2023 0849   LYMPHSABS 1,792 06/28/2018 1048   MONOABS 0.5 07/28/2016 0759   EOSABS 168  06/28/2018 1048   BASOSABS 28 06/28/2018 1048    Hgb A1C Lab Results  Component Value Date   HGBA1C 6.0 (H) 01/14/2023            Assessment & Plan:   Assessment and Plan    Viral Upper  Respiratory Infection, Exposure to Flu Symptoms of headache, nasal congestion, runny nose, sore throat, and ear discomfort. No cough or shortness of breath. Diarrhea reported. No fever checked but reports of feeling hot and cold. Exposure to flu at workplace and at home. -Perform COVID and flu test-negative. -Given his exposure, it could be that he is tested too early.  Will provide Rx for Tamiflu  75 mg twice daily -Recommend alternating Tylenol  and ibuprofen  as needed for body aches -Recommend Flonase  OTC for ear fullness -Continue symptomatic treatment with DayQuil and Sudafed.    Schedule an appointment for follow-up of chronic conditions  Angeline Laura, NP

## 2023-10-01 NOTE — Patient Instructions (Signed)

## 2023-10-07 ENCOUNTER — Ambulatory Visit (INDEPENDENT_AMBULATORY_CARE_PROVIDER_SITE_OTHER): Payer: Medicare Other | Admitting: Internal Medicine

## 2023-10-07 ENCOUNTER — Encounter: Payer: Self-pay | Admitting: Internal Medicine

## 2023-10-07 VITALS — BP 132/74 | Ht 67.0 in | Wt 201.8 lb

## 2023-10-07 DIAGNOSIS — G4733 Obstructive sleep apnea (adult) (pediatric): Secondary | ICD-10-CM

## 2023-10-07 DIAGNOSIS — Z6831 Body mass index (BMI) 31.0-31.9, adult: Secondary | ICD-10-CM

## 2023-10-07 DIAGNOSIS — E1159 Type 2 diabetes mellitus with other circulatory complications: Secondary | ICD-10-CM

## 2023-10-07 DIAGNOSIS — M25572 Pain in left ankle and joints of left foot: Secondary | ICD-10-CM | POA: Diagnosis not present

## 2023-10-07 DIAGNOSIS — Z794 Long term (current) use of insulin: Secondary | ICD-10-CM

## 2023-10-07 DIAGNOSIS — E1169 Type 2 diabetes mellitus with other specified complication: Secondary | ICD-10-CM | POA: Diagnosis not present

## 2023-10-07 DIAGNOSIS — N529 Male erectile dysfunction, unspecified: Secondary | ICD-10-CM

## 2023-10-07 DIAGNOSIS — E6609 Other obesity due to excess calories: Secondary | ICD-10-CM

## 2023-10-07 DIAGNOSIS — E66811 Obesity, class 1: Secondary | ICD-10-CM

## 2023-10-07 DIAGNOSIS — I152 Hypertension secondary to endocrine disorders: Secondary | ICD-10-CM

## 2023-10-07 DIAGNOSIS — G8929 Other chronic pain: Secondary | ICD-10-CM

## 2023-10-07 DIAGNOSIS — Z8619 Personal history of other infectious and parasitic diseases: Secondary | ICD-10-CM

## 2023-10-07 MED ORDER — DEXCOM G7 SENSOR MISC: 1.0000 | 0 refills | Status: DC

## 2023-10-07 MED ORDER — ACYCLOVIR 5 % EX OINT: 1.0000 | TOPICAL_OINTMENT | CUTANEOUS | 0 refills | Status: DC

## 2023-10-07 NOTE — Assessment & Plan Note (Signed)
Recent A1c and urine microalbumin reviewed Low-carb diet and exercise for weight loss Continue Semglee and Mounjaro Will request diabetic eye exam from [facility] Encouraged routine to foot exam

## 2023-10-07 NOTE — Assessment & Plan Note (Signed)
Encouraged diet and exercise for weight loss ?

## 2023-10-07 NOTE — Assessment & Plan Note (Signed)
Continue ibuprofen as needed.

## 2023-10-07 NOTE — Assessment & Plan Note (Signed)
RX for Acyclovir ointment

## 2023-10-07 NOTE — Assessment & Plan Note (Signed)
Not medicated We will monitor

## 2023-10-07 NOTE — Assessment & Plan Note (Signed)
Encourage weight loss as this can help reduce sleep apnea symptoms Continue CPAP use

## 2023-10-07 NOTE — Progress Notes (Signed)
Subjective:    Patient ID: Luke Coleman, male    DOB: 1950/12/01, 73 y.o.   MRN: [id number]  HPI  Patient presents to clinic today for follow-up of chronic conditions.  HTN: His BP today is 132/74.  He is taking amlodipine and lisinopril as prescribed.  ECG from 03/2020 reviewed.  OSA: He averages 7hours of sleep per night with use of CPAP.  Sleep study from 10/2019 reviewed.  DM2: His last A1c was 5.3%, 08/2023. He is taking semglee and mounjaro as prescribed.  His sugars average about 110.  He checks his feet routinely.  His last eye exam was 07/2022.  Flu 07/2021.  Pneumovax 06/2020.  Prevnar 10/2017.  COVID x 2.  He follows with endocrinology.  History of cold sores.  Managed with valacyclovir as needed.  Chronic ankle pain: He takes as needed with good relief of symptoms.  He does not follow with orthopedics.  ED: He is not currently taking any medications for this.  He does not follow with urology.  Review of Systems  Past Medical History:  Diagnosis Date   Acute bacterial sinusitis 08/22/2014   COPD (chronic obstructive pulmonary disease) (HCC)    Diabetes mellitus without complication (HCC)    Enlarged prostate    Essential (primary) hypertension 03/01/2013   Overview:  Last Assessment & Plan:  Taking meds as prescribed. Check bmet. Continue to follow.    HTN (hypertension) 03/01/2013   Hypertension    Sleep apnea    on CPAP, USUALLY...CURRENTLY BROKEN    Current Outpatient Medications  Medication Sig Dispense Refill   acyclovir ointment (ZOVIRAX) 5 % Apply 1 Application topically every 3 (three) hours. (Patient not taking: Reported on 10/01/2023) 15 g 1   albuterol (PROVENTIL) (2.5 MG/3ML) 0.083% nebulizer solution Take 3 mLs (2.5 mg total) by nebulization every 4 (four) hours as needed for wheezing. 75 mL 0   amLODipine (NORVASC) 10 MG tablet Take 1 tablet (10 mg total) by mouth daily. 90 tablet 1   cetirizine (ZYRTEC) 10 MG tablet Take 10 mg by mouth daily. (Patient not  taking: Reported on 10/01/2023)     Dulaglutide 4.5 MG/0.5ML SOPN Inject 4.5 mg into the skin every 7 (seven) days.      FLOVENT HFA 44 MCG/ACT inhaler INHALE 2 PUFFS INTO THE LUNGS TWICE DAILY (Patient not taking: Reported on 10/01/2023) 10.6 g 1   fluticasone (FLONASE) 50 MCG/ACT nasal spray SHAKE LIQUID AND USE 2 SPRAYS IN EACH NOSTRIL DAILY (Patient not taking: Reported on 10/01/2023) 48 g 1   glucose blood (ONE TOUCH ULTRA TEST) test strip USE TO TEST THREE TIMES DAILY 100 each 11   Insulin Pen Needle (BD PEN NEEDLE NANO 2ND GEN) 32G X 4 MM MISC Use 1 each once daily     ipratropium (ATROVENT) 0.06 % nasal spray Place 2 sprays into both nostrils 4 (four) times daily. For up to 5-7 days then stop. (Patient not taking: Reported on 10/01/2023) 15 mL 0   Lancets 28G MISC 3 (three) times daily. Use as instructed. DX: E11.9     lisinopril (ZESTRIL) 40 MG tablet TAKE 1 TABLET(40 MG) BY MOUTH DAILY 90 tablet 0   MOUNJARO 10 MG/0.5ML Pen Inject 2 mLs into the skin once a week.     oseltamivir (TAMIFLU) 75 MG capsule Take 1 capsule (75 mg total) by mouth 2 (two) times daily. For 5 days 10 capsule 0   SEMGLEE, YFGN, 100 UNIT/ML Pen 50 Units.     valACYclovir (VALTREX)  1000 MG tablet Take 2 tabs PO x 1 when you have a cold sore (Patient not taking: Reported on 10/01/2023) 20 tablet 0   No current facility-administered medications for this visit.    Allergies  Allergen Reactions   Adhesive [Tape]    Latex    Silicone Other (See Comments)    Pt states that it rips his skin.   Sulfa Antibiotics Rash    Penile rash    Family History  Problem Relation Age of Onset   Early death Mother        MVA - died age 75   Alcohol abuse Father    Hypertension Father    Cancer Father 74       renal cell carcinoma   Heart disease Father 51       MI - died   Diabetes Brother    Sleep apnea Brother    Sleep apnea Daughter    Sleep apnea Brother    Diabetes Maternal Uncle    Diabetes Paternal Aunt    Kidney  disease Maternal Grandmother     Social History   Socioeconomic History   Marital status: Married    Spouse name: Angelica Chessman   Number of children: 1   Years of education: 18   Highest education level: Master's degree (e.g., MA, MS, MEng, MEd, MSW, MBA)  Occupational History   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 Engen grew up in [place], [place], [place]. He currently is living in [place], [place] with his wife of 2 months. He was married previously to his second wife for 29 years. Previous to that, he was married to his first wife for 7 years and has 1 daughter from this marriage (79 y/o). He widowed in 2013 (second wife). 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 from [employer]. He obtained a Scientist, water quality in Programme researcher, broadcasting/film/video, Two Geophysical data processor from [employer] 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 He enjoys baseball. Enjoys the outdoors. He also enjoys watching old movies. He used to be a Psychologist, occupational for a Health and safety inspector.   Social Drivers of Health   Financial Resource Strain: Low Risk  (11/05/2017)   Overall Financial Resource Strain (CARDIA)    Difficulty of Paying Living Expenses: Not hard at all  Food Insecurity: No Food Insecurity (11/05/2017)   Hunger Vital Sign    Worried About Running Out of Food in the Last Year: Never true    Ran Out of Food in the Last Year: Never true  Transportation Needs: No Transportation Needs (11/05/2017)   PRAPARE - Scientist, research (physical sciences) (Medical): No    Lack of Transportation (Non-Medical): No  Physical Activity: Unknown (11/05/2017)   Exercise Vital Sign    Days of Exercise per Week: 0 days    Minutes of Exercise per Session: Not on file  Stress: Not on file  Social Connections: Not on file  Intimate Partner Violence: Not At Risk (11/05/2017)   Humiliation, Afraid, Rape, and Kick questionnaire    Fear of Current or Ex-Partner: No    Emotionally Abused: No    Physically Abused: No    Sexually Abused: No     Constitutional: Denies fever, malaise, fatigue, headache or abrupt weight changes.  HEENT: Denies eye pain, eye redness, ear pain, ringing in the ears, wax buildup, runny nose, nasal congestion, bloody nose, or sore throat. Respiratory: Denies difficulty breathing, shortness of breath, cough or sputum production.   Cardiovascular: Denies chest pain, chest tightness, palpitations or swelling in the hands or feet.  Gastrointestinal: Denies abdominal pain, bloating, constipation, diarrhea or blood in the stool.  GU: Patient reports erectile dysfunction.  Denies urgency, frequency, pain with urination, burning sensation, blood in urine, odor or discharge. Musculoskeletal: Patient reports chronic left ankle pain.  Denies decrease in range of motion, difficulty with gait, muscle pain or joint swelling.  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 Denies redness, rashes, lesions or ulcercations.  Neurological: Denies dizziness, difficulty with memory, difficulty with speech or problems with balance and coordination.  Psych: Denies anxiety, depression, SI/HI.  No other specific complaints in a complete review of systems (except as listed in HPI above).     Objective:   Physical Exam  BP 132/74 (BP Location: Left Arm, Patient Position: Sitting, Cuff Size: Large)   Ht 5\' 7"  (1.702 m)   Wt 201 lb 12.8 oz (91.5 kg)   BMI 31.61 kg/m    Wt Readings from Last 3 Encounters:  10/01/23 201 lb 6.4 oz (91.4 kg)   07/21/23 205 lb 12.8 oz (93.4 kg)  01/14/23 208 lb (94.3 kg)    General: Appears his stated age, obese in NAD. Skin: Warm, dry and intact. No ulcerations noted. HEENT: Head: normal shape and size; Eyes: sclera white, no icterus, conjunctiva pink, PERRLA and EOMs intact;   Cardiovascular: Normal rate and rhythm. S1,S2 noted.  No murmur, rubs or gallops noted. No JVD or BLE edema. No carotid bruits noted. Pulmonary/Chest: Normal effort and positive vesicular breath sounds. No respiratory distress. No wheezes, rales or ronchi noted.  Abdomen: Soft and nontender. Normal bowel sounds.  Musculoskeletal: Strength 5/5 BUE/BLE. No difficulty with gait.  Neurological: Alert and oriented.  Coordination normal.  Psychiatric: Mood and affect normal. Behavior is normal. Judgment and thought content normal.    BMET    Component Value Date/Time   NA 142 01/14/2023 0849   K 4.5 01/14/2023 0849   CL 106 01/14/2023 0849   CO2 26 01/14/2023 0849   GLUCOSE 98 01/14/2023 0849   BUN 23 01/14/2023 0849   CREATININE 1.21 01/14/2023 0849   CALCIUM 9.2 01/14/2023 0849   GFRNONAA 42 (L) 03/21/2020 0917   GFRNONAA 57 (L) 06/02/2018 0915   GFRAA 48 (L) 03/21/2020 0917   GFRAA 66 06/02/2018 0915    Lipid Panel     Component Value Date/Time   CHOL 113 01/14/2023 0849   TRIG 70 01/14/2023 0849   HDL 39 (L) 01/14/2023 0849   CHOLHDL 2.9 01/14/2023 0849   LDLCALC 59 01/14/2023 0849    CBC    Component Value Date/Time   WBC 7.7 01/14/2023 0849   RBC 5.02 01/14/2023 0849   HGB 16.3 01/14/2023 0849   HCT 48.8 01/14/2023 0849   PLT 203 01/14/2023 0849   MCV 97.2 01/14/2023 0849   MCH 32.5 01/14/2023 0849   MCHC 33.4 01/14/2023 0849   RDW 13.1 01/14/2023 0849  LYMPHSABS 1,792 06/28/2018 1048   MONOABS 0.5 07/28/2016 0759   EOSABS 168 06/28/2018 1048   BASOSABS 28 06/28/2018 1048    Hgb A1C Lab Results  Component Value Date   HGBA1C 6.0 (H) 01/14/2023           Assessment & Plan:      RTC in 6 months for annual exam Nicki Reaper, NP

## 2023-10-07 NOTE — Patient Instructions (Signed)

## 2023-10-07 NOTE — Assessment & Plan Note (Signed)
Controlled on amlodipine and lisinopril Reinforced DASH diet and exercise for weight loss

## 2023-10-11 NOTE — Progress Notes (Addendum)
PA started   Luke Coleman (Key: [id number]) Rx #: [id number] Need Help? Call us at [phone] Status New (Not sent to plan) Drug Dexcom G7 Sensor ePA cloud logo Form [employer] Electronic PA Form [phone] NCPDP) Original Claim Info 75   Approved 10/11/23-10/10/24

## 2023-12-07 ENCOUNTER — Encounter: Payer: Self-pay | Admitting: Family Medicine

## 2023-12-07 ENCOUNTER — Ambulatory Visit (INDEPENDENT_AMBULATORY_CARE_PROVIDER_SITE_OTHER): Admitting: Family Medicine

## 2023-12-07 VITALS — BP 130/64 | HR 90 | Ht 67.0 in | Wt 204.5 lb

## 2023-12-07 DIAGNOSIS — J0110 Acute frontal sinusitis, unspecified: Secondary | ICD-10-CM | POA: Diagnosis not present

## 2023-12-07 DIAGNOSIS — J069 Acute upper respiratory infection, unspecified: Secondary | ICD-10-CM | POA: Diagnosis not present

## 2023-12-07 MED ORDER — ALBUTEROL SULFATE (2.5 MG/3ML) 0.083% IN NEBU: 2.5000 mg | INHALATION_SOLUTION | RESPIRATORY_TRACT | 0 refills | Status: AC | PRN

## 2023-12-07 MED ORDER — PSEUDOEPH-BROMPHEN-DM 30-2-10 MG/5ML PO SYRP: 5.0000 mL | ORAL_SOLUTION | Freq: Four times a day (QID) | ORAL | 0 refills | Status: DC | PRN

## 2023-12-07 MED ORDER — IPRATROPIUM BROMIDE 0.06 % NA SOLN: 2.0000 | Freq: Four times a day (QID) | NASAL | 0 refills | Status: DC

## 2023-12-07 MED ORDER — AMOXICILLIN-POT CLAVULANATE 875-125 MG PO TABS: 1.0000 | ORAL_TABLET | Freq: Two times a day (BID) | ORAL | 0 refills | Status: DC

## 2023-12-07 NOTE — Progress Notes (Signed)
 Subjective:    Patient ID: Luke Coleman, male    DOB: 13-Feb-1951, 73 y.o.   MRN: [id number]  Luke Coleman is a 73 y.o. male presenting on 12/07/2023 for Sinusitis  Patient presents for a same day appointment.  PCP Nicki Reaper, FNP   HPI  Discussed the use of AI scribe software for clinical note transcription with the patient, who gave verbal consent to proceed.  History of Present Illness   Luke Coleman is a 73 year old male who presents with respiratory symptoms and sinus congestion.  He began experiencing respiratory symptoms after spending time in the yard on Sunday. He feels weak and has sneezing, coughing, and nasal congestion. His sinuses are mostly clear, but he has a runny nose and occasionally coughs up green sputum. The cough is more pronounced when lying down, affecting his sleep, with only about three hours of rest last night.  His CPAP machine exacerbates his nasal congestion, making it difficult to sleep. He experiences chills and alternates between feeling hot and cold, suggesting possible feverish symptoms. He recalls having the flu in February but states that his current symptoms feel different, more like sinus issues.  He has a history of allergies and has been treated with Augmentin and nasal spray in the past. He did not use prednisone last time but has used it before with good effect. He prefers cough syrup over pearls for cough relief, as it 'lubricates his throat'. He also has a nebulizer at home and has used an albuterol rescue inhaler in the past for similar symptoms.  He is concerned about returning to work due to his symptoms, particularly the frequent nose blowing, and has requested a work note. He is considering returning to work tomorrow if his condition improves.         12/07/2023    9:32 AM 01/14/2023    9:09 AM 07/27/2022    9:01 AM  Depression screen PHQ 2/9  Decreased Interest 0 1 0  Down, Depressed, Hopeless 0 0 1  PHQ - 2 Score 0 1 1  Altered  sleeping 1    Tired, decreased energy 1    Change in appetite 0    Feeling bad or failure about yourself  0    Trouble concentrating 0    Moving slowly or fidgety/restless 0    Suicidal thoughts 0    PHQ-9 Score 2    Difficult doing work/chores Not difficult at all         12/07/2023    9:32 AM 01/14/2023    9:09 AM 07/27/2022    9:01 AM 03/11/2021   10:59 AM  GAD 7 : Generalized Anxiety Score  Nervous, Anxious, on Edge 0 0 0 0  Control/stop worrying 0 0 0 0  Worry too much - different things 0 0 0 0  Trouble relaxing 0 0 0 0  Restless 0 1 1 0  Easily annoyed or irritable 0 0 0 0  Afraid - awful might happen 0 0 0 0  Total GAD 7 Score 0 1 1 0  Anxiety Difficulty Not difficult at all Not difficult at all Not difficult at all Not difficult at all    Social History   Tobacco Use   Smoking status: Never   Smokeless tobacco: Never  Vaping Use   Vaping status: Never Used  Substance Use Topics   Alcohol use: Yes    Alcohol/week: 0.0 standard drinks of alcohol    Comment: Occasional   Drug use: No  Review of Systems Per HPI unless specifically indicated above     Objective:    BP 130/64 (BP Location: Left Arm, Patient Position: Sitting, Cuff Size: Normal)   Pulse 90   Ht 5\' 7"  (1.702 m)   Wt 204 lb 8 oz (92.8 kg)   SpO2 100%   BMI 32.03 kg/m   Wt Readings from Last 3 Encounters:  12/07/23 204 lb 8 oz (92.8 kg)  10/07/23 201 lb 12.8 oz (91.5 kg)  10/01/23 201 lb 6.4 oz (91.4 kg)    Physical Exam Vitals and nursing note reviewed.  Constitutional:      General: He is not in acute distress.    Appearance: He is well-developed. He is not diaphoretic.     Comments: Well-appearing, comfortable, cooperative  HENT:     Head: Normocephalic and atraumatic.  Eyes:     General:        Right eye: No discharge.        Left eye: No discharge.     Conjunctiva/sclera: Conjunctivae normal.  Neck:     Thyroid: No thyromegaly.  Cardiovascular:     Rate and Rhythm: Normal  rate and regular rhythm.     Pulses: Normal pulses.     Heart sounds: Normal heart sounds. No murmur heard. Pulmonary:     Effort: Pulmonary effort is normal. No respiratory distress.     Breath sounds: Normal breath sounds. No wheezing or rales.     Comments: Coughing, some coarse breath sounds Musculoskeletal:        General: Normal range of motion.     Cervical back: Normal range of motion and neck supple.  Lymphadenopathy:     Cervical: No cervical adenopathy.  Skin:    General: Skin is warm and dry.     Findings: No erythema or rash.  Neurological:     Mental Status: He is alert and oriented to person, place, and time. Mental status is at baseline.  Psychiatric:        Behavior: Behavior normal.     Comments: Well groomed, good eye contact, normal speech and thoughts     Results for orders placed or performed in visit on 10/07/23  Hemoglobin A1c   Collection Time: 08/26/23 12:00 AM  Result Value Ref Range   Hemoglobin A1C 5.3       Assessment & Plan:   Problem List Items Addressed This Visit   None Visit Diagnoses       Acute non-recurrent frontal sinusitis    -  Primary   Relevant Medications   brompheniramine-pseudoephedrine-DM 30-2-10 MG/5ML syrup   amoxicillin-clavulanate (AUGMENTIN) 875-125 MG tablet   ipratropium (ATROVENT) 0.06 % nasal spray     Viral URI       Relevant Medications   albuterol (PROVENTIL) (2.5 MG/3ML) 0.083% nebulizer solution         Sinusitis / Upper Respiratory Tract Infection Symptoms suggest viral origin with possible bacterial superinfection. CPAP use worsens nasal congestion. Avoided steroids initially due to sleep concerns. - Prescribe Augmentin for bacterial infection concerns - Start Atrovent nasal spray decongestant 2 sprays in each nostril up to 4 times daily for 7 days - Prescribe cough syrup for cough relief. - Advise nebulizer with albuterol for bronchospasm or chest tightness. - Order albuterol solution for  nebulizer. - Provide work note for December 07, 2023, with option for December 08, 2023.  Follow-up Monitor symptoms, consider steroid if no improvement. - Advise follow-up if no improvement by end of week  or early next week. - Consider steroid addition if symptoms persist.       No orders of the defined types were placed in this encounter.   Meds ordered this encounter  Medications   brompheniramine-pseudoephedrine-DM 30-2-10 MG/5ML syrup    Sig: Take 5 mLs by mouth 4 (four) times daily as needed.    Dispense:  118 mL    Refill:  0   amoxicillin-clavulanate (AUGMENTIN) 875-125 MG tablet    Sig: Take 1 tablet by mouth 2 (two) times daily.    Dispense:  20 tablet    Refill:  0   ipratropium (ATROVENT) 0.06 % nasal spray    Sig: Place 2 sprays into both nostrils 4 (four) times daily. For up to 5-7 days then stop.    Dispense:  15 mL    Refill:  0   albuterol (PROVENTIL) (2.5 MG/3ML) 0.083% nebulizer solution    Sig: Take 3 mLs (2.5 mg total) by nebulization every 4 (four) hours as needed for wheezing.    Dispense:  75 mL    Refill:  0    Follow up plan: Return if symptoms worsen or fail to improve.   Domingo Friend, DO [facility] 12/07/2023, 9:51 AM

## 2023-12-07 NOTE — Patient Instructions (Addendum)

## 2023-12-14 ENCOUNTER — Other Ambulatory Visit: Payer: Self-pay | Admitting: Family Medicine

## 2023-12-14 DIAGNOSIS — J0110 Acute frontal sinusitis, unspecified: Secondary | ICD-10-CM

## 2023-12-14 NOTE — Telephone Encounter (Signed)
 Requested medications are due for refill today.  unsure  Requested medications are on the active medications list.  yes  Last refill. 12/07/2023 15mL 0 rf  Future visit scheduled.   yes  Notes to clinic.  Medication not assigned to a protocol. Please review.    Requested Prescriptions  Pending Prescriptions Disp Refills   ipratropium (ATROVENT ) 0.06 % nasal spray [Pharmacy Med Name: IPRATROPIUM 0.06% NAS SP 15ML (165)] 15 mL 0    Sig: USE 2 SPRAYS IN EACH NOSTRIL FOUR TIMES DAILY FOR UP TO 5 TO 7 DAYS THEN STOP     Off-Protocol Failed - 12/14/2023  5:50 PM      Failed - Medication not assigned to a protocol, review manually.      Passed - Valid encounter within last 12 months    Recent Outpatient Visits           1 week ago Acute non-recurrent frontal sinusitis   [place] [facility] [place], Kayleen Party, DO   2 months ago Hypertension associated with diabetes [facility])   [place] [facility] [place], Rankin Buzzard, NP   2 months ago Exposure to the flu   [place] [facility] [place], Rankin Buzzard, NP       Future Appointments             In 3 months Thalia Filler, Rankin Buzzard, NP [facility], [facility]           Off-Protocol Failed - 12/14/2023  5:50 PM      Failed - Medication not assigned to a protocol, review manually.      Passed - Valid encounter within last 12 months    Recent Outpatient Visits           1 week ago Acute non-recurrent frontal sinusitis   [place] [facility] [place], Kayleen Party, DO   2 months ago Hypertension associated with diabetes [facility])   [place] [facility] [place], Rankin Buzzard, NP   2 months ago Exposure to the flu   [facility] [place], Rankin Buzzard, NP       Future Appointments             In 3 months Baity, Rankin Buzzard, NP  [facility], [facility]

## 2023-12-16 ENCOUNTER — Encounter: Payer: Self-pay | Admitting: Family Medicine

## 2023-12-16 DIAGNOSIS — J0110 Acute frontal sinusitis, unspecified: Secondary | ICD-10-CM

## 2023-12-16 MED ORDER — PREDNISONE 10 MG PO TABS: ORAL_TABLET | ORAL | 0 refills | Status: DC

## 2023-12-16 MED ORDER — GUAIFENESIN-CODEINE 100-10 MG/5ML PO SOLN: 5.0000 mL | Freq: Three times a day (TID) | ORAL | 0 refills | Status: DC | PRN

## 2024-03-19 ENCOUNTER — Other Ambulatory Visit: Payer: Self-pay | Admitting: Internal Medicine

## 2024-03-21 NOTE — Telephone Encounter (Signed)
 Requested Prescriptions  Pending Prescriptions Disp Refills   Continuous Glucose Sensor (DEXCOM G7 SENSOR) MISC [Pharmacy Med Name: DEXCOM G7 SENSOR] 12 each 0    Sig: USE AS DIRECTED AND REPLACE SENSOR EVERY 10 DAYS     Endocrinology: Diabetes - Testing Supplies Passed - 03/21/2024  9:27 AM      Passed - Valid encounter within last 12 months    Recent Outpatient Visits           3 months ago Acute non-recurrent frontal sinusitis   [facility] [place], Marsa PARAS, DO   5 months ago Hypertension associated with diabetes [facility])   [place] [facility] [place], Angeline ORN, NP   5 months ago Exposure to the flu   [facility] [place], Angeline ORN, NP       Future Appointments             In 2 weeks Anton[place], Angeline ORN, NP [facility], [facility]

## 2024-03-29 LAB — HM DIABETES EYE EXAM

## 2024-03-30 ENCOUNTER — Encounter: Payer: Self-pay | Admitting: Ophthalmology

## 2024-04-03 ENCOUNTER — Encounter: Payer: Self-pay | Admitting: Ophthalmology

## 2024-04-03 NOTE — Anesthesia Preprocedure Evaluation (Signed)
 Anesthesia Evaluation  Patient identified by MRN, date of birth, ID band Patient awake    Reviewed: Allergy & Precautions, H&P , NPO status , Patient's Chart, lab work & pertinent test results  Airway Mallampati: III  TM Distance: >3 FB Neck ROM: Full    Dental no notable dental hx.    Pulmonary neg pulmonary ROS, sleep apnea , COPD   Pulmonary exam normal breath sounds clear to auscultation       Cardiovascular hypertension, Normal cardiovascular exam Rhythm:Regular Rate:Normal     Neuro/Psych negative neurological ROS  negative psych ROS   GI/Hepatic negative GI ROS, Neg liver ROS,,,  Endo/Other  diabetes    Renal/GU negative Renal ROS  negative genitourinary   Musculoskeletal negative musculoskeletal ROS (+)    Abdominal   Peds negative pediatric ROS (+)  Hematology negative hematology ROS (+)   Anesthesia Other Findings Previous cataract surgery 10-26-2018 had left eye done  Diabetes mellitus without complication (HCC) Hypertension COPD (chronic obstructive pulmonary disease) (HCC) Enlarged prostate HTN (hypertension) Essential (primary) hypertension Acute bacterial sinusitis Sleep apnea  PTSD, hx awareness under general anesthesia  Reproductive/Obstetrics negative OB ROS                              Anesthesia Physical Anesthesia Plan  ASA: 3  Anesthesia Plan: MAC   Post-op Pain Management:    Induction: Intravenous  PONV Risk Score and Plan:   Airway Management Planned: Natural Airway and Nasal Cannula  Additional Equipment:   Intra-op Plan:   Post-operative Plan:   Informed Consent: I have reviewed the patients History and Physical, chart, labs and discussed the procedure including the risks, benefits and alternatives for the proposed anesthesia with the patient or authorized representative who has indicated his/her understanding and acceptance.     Dental  Advisory Given  Plan Discussed with: Anesthesiologist, CRNA and Surgeon  Anesthesia Plan Comments: (Patient consented for risks of anesthesia including but not limited to:  - adverse reactions to medications - damage to eyes, teeth, lips or other oral mucosa - nerve damage due to positioning  - sore throat or hoarseness - Damage to heart, brain, nerves, lungs, other parts of body or loss of life  Patient voiced understanding and assent.)         Anesthesia Quick Evaluation

## 2024-04-06 ENCOUNTER — Encounter: Payer: Self-pay | Admitting: Internal Medicine

## 2024-04-06 ENCOUNTER — Ambulatory Visit (INDEPENDENT_AMBULATORY_CARE_PROVIDER_SITE_OTHER): Payer: Managed Care, Other (non HMO) | Admitting: Internal Medicine

## 2024-04-06 VITALS — BP 120/70 | Ht 67.0 in | Wt 197.4 lb

## 2024-04-06 DIAGNOSIS — Z125 Encounter for screening for malignant neoplasm of prostate: Secondary | ICD-10-CM

## 2024-04-06 DIAGNOSIS — E66811 Obesity, class 1: Secondary | ICD-10-CM

## 2024-04-06 DIAGNOSIS — E1169 Type 2 diabetes mellitus with other specified complication: Secondary | ICD-10-CM

## 2024-04-06 DIAGNOSIS — E6609 Other obesity due to excess calories: Secondary | ICD-10-CM

## 2024-04-06 DIAGNOSIS — Z7985 Long-term (current) use of injectable non-insulin antidiabetic drugs: Secondary | ICD-10-CM

## 2024-04-06 DIAGNOSIS — Z6830 Body mass index (BMI) 30.0-30.9, adult: Secondary | ICD-10-CM

## 2024-04-06 DIAGNOSIS — Z794 Long term (current) use of insulin: Secondary | ICD-10-CM

## 2024-04-06 DIAGNOSIS — Z0001 Encounter for general adult medical examination with abnormal findings: Secondary | ICD-10-CM | POA: Diagnosis not present

## 2024-04-06 MED ORDER — ONDANSETRON 4 MG PO TBDP: 4.0000 mg | ORAL_TABLET | Freq: Three times a day (TID) | ORAL | 0 refills | Status: AC | PRN

## 2024-04-06 NOTE — Progress Notes (Signed)
 Subjective:    Patient ID: Luke Coleman, male    DOB: December 06, 1950, 73 y.o.   MRN: [id number]  HPI  Patient presents to clinic today for his annual exam.  Flu: 07/2023 Tetanus: 04/2020 COVID: Pfizer x 2 Pneumovax: 06/2020 Prevnar: 10/2017 Shingrix: Never PSA screening: 12/2022 Colon screening: 04/2020 Vision screening: annually Dentist: as needed  Diet: He does eat meat. He consumes fruits and veggies. He does eat some fried foods. He drinks mostly water and Dt. Soda. Exercise: None  Review of Systems  Past Medical History:  Diagnosis Date   Acute bacterial sinusitis 08/22/2014   COPD (chronic obstructive pulmonary disease) (HCC)    Diabetes mellitus without complication (HCC)    Enlarged prostate    Essential (primary) hypertension 03/01/2013   Overview:  Last Assessment & Plan:  Taking meds as prescribed. Check bmet. Continue to follow.    HTN (hypertension) 03/01/2013   Hypertension    Hypertension associated with diabetes (HCC)    Sleep apnea    on CPAP    Current Outpatient Medications  Medication Sig Dispense Refill   albuterol  (PROVENTIL ) (2.5 MG/3ML) 0.083% nebulizer solution Take 3 mLs (2.5 mg total) by nebulization every 4 (four) hours as needed for wheezing. 75 mL 0   amLODipine  (NORVASC ) 10 MG tablet Take 1 tablet (10 mg total) by mouth daily. 90 tablet 1   Continuous Glucose Sensor (DEXCOM G7 SENSOR) MISC USE AS DIRECTED AND REPLACE SENSOR EVERY 10 DAYS 12 each 0   glucose blood (ONE TOUCH ULTRA TEST) test strip USE TO TEST THREE TIMES DAILY 100 each 11   Insulin  Pen Needle (BD PEN NEEDLE NANO 2ND GEN) 32G X 4 MM MISC Use 1 each once daily     Lancets 28G MISC 3 (three) times daily. Use as instructed. DX: E11.9     lisinopril  (ZESTRIL ) 40 MG tablet TAKE 1 TABLET(40 MG) BY MOUTH DAILY 90 tablet 0   MOUNJARO 10 MG/0.5ML Pen Inject 2 mLs into the skin once a week.     No current facility-administered medications for this visit.    Allergies  Allergen  Reactions   Adhesive [Tape]    Latex    Silicone Other (See Comments)    Pt states that it rips his skin.   Sulfa  Antibiotics Rash    Penile rash    Family History  Problem Relation Age of Onset   Early death Mother        MVA - died age 79   Alcohol abuse Father    Hypertension Father    Cancer Father 35       renal cell carcinoma   Heart disease Father 88       MI - died   Diabetes Brother    Sleep apnea Brother    Sleep apnea Daughter    Sleep apnea Brother    Diabetes Maternal Uncle    Diabetes Paternal Aunt    Kidney disease Maternal Grandmother     Social History   Socioeconomic History   Marital status: Married    Spouse name: [name]   Number of children: 1   Years of education: 18   Highest education level: Master's degree (e.g., MA, MS, MEng, MEd, MSW, MBA)  Occupational History   Occupation: Public house manager of Continuing Education    Comment: Designer, multimedia  Tobacco Use   Smoking status: Never   Smokeless tobacco: Never  Vaping Use   Vaping status: Never Used  Substance and Sexual Activity  Alcohol use: Yes    Alcohol/week: 0.0 standard drinks of alcohol    Comment: Occasional   Drug use: No   Sexual activity: Yes    Partners: Female  Other Topics Concern   Not on file  Social History Narrative   Mr. Groninger grew up in [place], [place], [place]. He currently is living in [place], [place] with his wife of 2 months. He was married previously to his second wife for 29 years. Previous to that, he was married to his first wife for 7 years and has 1 daughter from this marriage (25 y/o). He widowed in 2013 (second wife). 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 He obtained a Scientist, water quality in Programme researcher, broadcasting/film/video, Two Geophysical data processor from [employer] 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 He enjoys baseball. Enjoys the outdoors. He also  enjoys watching old movies. He used to be a Psychologist, occupational for a Health and safety inspector.   Social Drivers of Health   Financial Resource Strain: Low Risk  (11/05/2017)   Overall Financial Resource Strain (CARDIA)    Difficulty of Paying Living Expenses: Not hard at all  Food Insecurity: No Food Insecurity (11/05/2017)   Hunger Vital Sign    Worried About Running Out of Food in the Last Year: Never true    Ran Out of Food in the Last Year: Never true  Transportation Needs: No Transportation Needs (11/05/2017)   PRAPARE - Administrator, Civil Service (Medical): No    Lack of Transportation (Non-Medical): No  Physical Activity: Unknown (11/05/2017)   Exercise Vital Sign    Days of Exercise per Week: 0 days    Minutes of Exercise per Session: Not on file  Stress: Not on file  Social Connections: Not on file  Intimate Partner Violence: Not At Risk (11/05/2017)   Humiliation, Afraid, Rape, and Kick questionnaire    Fear of Current or Ex-Partner: No    Emotionally Abused: No    Physically Abused: No    Sexually Abused: No     Constitutional: Denies fever, malaise, fatigue, headache or abrupt weight changes.  HEENT: Denies eye pain, eye redness, ear pain, ringing in the ears, wax buildup, runny nose, nasal congestion, bloody nose, or sore throat. Respiratory: Denies difficulty breathing, shortness of breath, cough or sputum production.   Cardiovascular: Denies chest pain, chest tightness, palpitations or swelling in the hands or feet.  Gastrointestinal: Pt reports intermittent diarrhea. Denies abdominal pain, bloating, constipation, diarrhea or blood in the stool.  GU: Patient reports erectile dysfunction, urinary frequency.  Denies urgency, pain with urination, burning sensation, blood in urine, odor or discharge. Musculoskeletal:  Denies decrease in range of motion, difficulty with gait, muscle pain or joint pain or swelling.  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 Denies redness, rashes,  lesions or ulcercations.  Neurological: Pt reports intermittent paresthesia of right foot. Denies dizziness, difficulty with memory, difficulty with speech or problems with balance and coordination.  Psych: Denies anxiety, depression, SI/HI.  No other specific complaints in a complete review of systems (except as listed in HPI above).     Objective:   Physical Exam  BP 120/70 (BP Location: Left Arm, Patient Position: Sitting, Cuff Size: Normal)   Ht 5' 7 (1.702 m)   Wt 197 lb 6.4 oz (89.5 kg)   BMI 30.92 kg/m    Wt Readings from Last 3 Encounters:  12/07/23  204 lb 8 oz (92.8 kg)  10/07/23 201 lb 12.8 oz (91.5 kg)  10/01/23 201 lb 6.4 oz (91.4 kg)    General: Appears his stated age, obese in NAD. Skin: Warm, dry and intact. No ulcerations noted. HEENT: Head: normal shape and size; Eyes: sclera white, no icterus, conjunctiva pink, PERRLA and EOMs intact;  Neck:  Neck supple, trachea midline. No masses, lumps or thyromegaly present.  Cardiovascular: Normal rate and rhythm. S1,S2 noted.  No murmur, rubs or gallops noted. No JVD or BLE edema. No carotid bruits noted. Pulmonary/Chest: Normal effort and positive vesicular breath sounds. No respiratory distress. No wheezes, rales or ronchi noted.  Abdomen: Soft and nontender. Normal bowel sounds.  Musculoskeletal: Strength 5/5 BUE/BLE. No difficulty with gait.  Neurological: Alert and oriented. Cranial nerves II-XII grossly intact. Coordination normal.  Psychiatric: Mood and affect normal. Behavior is normal. Judgment and thought content normal.    BMET    Component Value Date/Time   NA 142 01/14/2023 0849   K 4.5 01/14/2023 0849   CL 106 01/14/2023 0849   CO2 26 01/14/2023 0849   GLUCOSE 98 01/14/2023 0849   BUN 23 01/14/2023 0849   CREATININE 1.21 01/14/2023 0849   CALCIUM  9.2 01/14/2023 0849   GFRNONAA 42 (L) 03/21/2020 0917   GFRNONAA 57 (L) 06/02/2018 0915   GFRAA 48 (L) 03/21/2020 0917   GFRAA 66 06/02/2018 0915     Lipid Panel     Component Value Date/Time   CHOL 113 01/14/2023 0849   TRIG 70 01/14/2023 0849   HDL 39 (L) 01/14/2023 0849   CHOLHDL 2.9 01/14/2023 0849   LDLCALC 59 01/14/2023 0849    CBC    Component Value Date/Time   WBC 7.7 01/14/2023 0849   RBC 5.02 01/14/2023 0849   HGB 16.3 01/14/2023 0849   HCT 48.8 01/14/2023 0849   PLT 203 01/14/2023 0849   MCV 97.2 01/14/2023 0849   MCH 32.5 01/14/2023 0849   MCHC 33.4 01/14/2023 0849   RDW 13.1 01/14/2023 0849   LYMPHSABS 1,792 06/28/2018 1048   MONOABS 0.5 07/28/2016 0759   EOSABS 168 06/28/2018 1048   BASOSABS 28 06/28/2018 1048    Hgb A1C Lab Results  Component Value Date   HGBA1C 5.3 08/26/2023           Assessment & Plan:   Preventative Health Maintenance:  Encouraged him to get a flu shot in the fall Tetanus UTD Encourage him to get his COVID booster Pneumovax and Prevnar UTD Discussed Shingrix vaccine, he will check coverage with his insurance company and schedule visit if he would like to have this done Colon screening UTD Encouraged him to consume a balanced diet and exercise regimen Advised him to see an eye doctor and dentist annually We will check CBC, c-Met, lipid, A1c, urine microalbumin and PSA today   RTC in 6 months, follow-up chronic conditions Angeline Laura, NP

## 2024-04-06 NOTE — Patient Instructions (Signed)
 Health Maintenance, Male  Adopting a healthy lifestyle and getting preventive care are important in promoting health and wellness. Ask your health care provider about:  The right schedule for you to have regular tests and exams.  Things you can do on your own to prevent diseases and keep yourself healthy.  What should I know about diet, weight, and exercise?  Eat a healthy diet    Eat a diet that includes plenty of vegetables, fruits, low-fat dairy products, and lean protein.  Do not eat a lot of foods that are high in solid fats, added sugars, or sodium.  Maintain a healthy weight  Body mass index (BMI) is a measurement that can be used to identify possible weight problems. It estimates body fat based on height and weight. Your health care provider can help determine your BMI and help you achieve or maintain a healthy weight.  Get regular exercise  Get regular exercise. This is one of the most important things you can do for your health. Most adults should:  Exercise for at least 150 minutes each week. The exercise should increase your heart rate and make you sweat (moderate-intensity exercise).  Do strengthening exercises at least twice a week. This is in addition to the moderate-intensity exercise.  Spend less time sitting. Even light physical activity can be beneficial.  Watch cholesterol and blood lipids  Have your blood tested for lipids and cholesterol at 72 years of age, then have this test every 5 years.  You may need to have your cholesterol levels checked more often if:  Your lipid or cholesterol levels are high.  You are older than 72 years of age.  You are at high risk for heart disease.  What should I know about cancer screening?  Many types of cancers can be detected early and may often be prevented. Depending on your health history and family history, you may need to have cancer screening at various ages. This may include screening for:  Colorectal cancer.  Prostate cancer.  Skin cancer.  Lung  cancer.  What should I know about heart disease, diabetes, and high blood pressure?  Blood pressure and heart disease  High blood pressure causes heart disease and increases the risk of stroke. This is more likely to develop in people who have high blood pressure readings or are overweight.  Talk with your health care provider about your target blood pressure readings.  Have your blood pressure checked:  Every 3-5 years if you are 24-52 years of age.  Every year if you are 3 years old or older.  If you are between the ages of 60 and 72 and are a current or former smoker, ask your health care provider if you should have a one-time screening for abdominal aortic aneurysm (AAA).  Diabetes  Have regular diabetes screenings. This checks your fasting blood sugar level. Have the screening done:  Once every three years after age 66 if you are at a normal weight and have a low risk for diabetes.  More often and at a younger age if you are overweight or have a high risk for diabetes.  What should I know about preventing infection?  Hepatitis B  If you have a higher risk for hepatitis B, you should be screened for this virus. Talk with your health care provider to find out if you are at risk for hepatitis B infection.  Hepatitis C  Blood testing is recommended for:  Everyone born from [birth year] through 1964.  Anyone  with known risk factors for hepatitis C.  Sexually transmitted infections (STIs)  You should be screened each year for STIs, including gonorrhea and chlamydia, if:  You are sexually active and are younger than 72 years of age.  You are older than 72 years of age and your health care provider tells you that you are at risk for this type of infection.  Your sexual activity has changed since you were last screened, and you are at increased risk for chlamydia or gonorrhea. Ask your health care provider if you are at risk.  Ask your health care provider about whether you are at high risk for HIV. Your health care provider  may recommend a prescription medicine to help prevent HIV infection. If you choose to take medicine to prevent HIV, you should first get tested for HIV. You should then be tested every 3 months for as long as you are taking the medicine.  Follow these instructions at home:  Alcohol use  Do not drink alcohol if your health care provider tells you not to drink.  If you drink alcohol:  Limit how much you have to 0-2 drinks a day.  Know how much alcohol is in your drink. In the [place] one drink equals one 12 oz bottle of beer (355 mL), one 5 oz glass of wine (148 mL), or one 1 oz glass of hard liquor (44 mL).  Lifestyle  Do not use any products that contain nicotine or tobacco. These products include cigarettes, chewing tobacco, and vaping devices, such as e-cigarettes. If you need help quitting, ask your health care provider.  Do not use street drugs.  Do not share needles.  Ask your health care provider for help if you need support or information about quitting drugs.  General instructions  Schedule regular health, dental, and eye exams.  Stay current with your vaccines.  Tell your health care provider if:  You often feel depressed.  You have ever been abused or do not feel safe at home.  Summary  Adopting a healthy lifestyle and getting preventive care are important in promoting health and wellness.  Follow your health care provider's instructions about healthy diet, exercising, and getting tested or screened for diseases.  Follow your health care provider's instructions on monitoring your cholesterol and blood pressure.  This information is not intended to replace advice given to you by your health care provider. Make sure you discuss any questions you have with your health care provider.  Document Revised: 12/30/2020 Document Reviewed: 12/30/2020  Elsevier Patient Education  2024 [employer].

## 2024-04-06 NOTE — Assessment & Plan Note (Signed)
 Encouraged diet and exercise for weight loss ?

## 2024-04-07 ENCOUNTER — Ambulatory Visit: Payer: Self-pay | Admitting: Internal Medicine

## 2024-04-07 LAB — MICROALBUMIN / CREATININE URINE RATIO
Creatinine, Urine: 162 mg/dL (ref 20–320)
Microalb Creat Ratio: 10 mg/g{creat} (ref <30)
Microalb, Ur: 1.7 mg/dL

## 2024-04-07 LAB — CBC
HCT: 46.7 % (ref 38.5–50.0)
Hemoglobin: 15.8 g/dL (ref 13.2–17.1)
MCH: 32.2 pg (ref 27.0–33.0)
MCHC: 33.8 g/dL (ref 32.0–36.0)
MCV: 95.1 fL (ref 80.0–100.0)
MPV: 11.5 fL (ref 7.5–12.5)
Platelets: 194 Thousand/uL (ref 140–400)
RBC: 4.91 Million/uL (ref 4.20–5.80)
RDW: 12.9 % (ref 11.0–15.0)
WBC: 11.6 Thousand/uL — ABNORMAL HIGH (ref 3.8–10.8)

## 2024-04-07 LAB — PSA: PSA: 0.31 ng/mL (ref <=4.00)

## 2024-04-10 ENCOUNTER — Encounter: Payer: Self-pay | Admitting: Ophthalmology

## 2024-04-10 ENCOUNTER — Other Ambulatory Visit: Payer: Self-pay

## 2024-04-10 ENCOUNTER — Ambulatory Visit: Payer: Self-pay | Admitting: Anesthesiology

## 2024-04-10 ENCOUNTER — Ambulatory Visit
Admission: RE | Admit: 2024-04-10 | Discharge: 2024-04-10 | Disposition: A | Discharge status: Home / Self Care | Attending: Ophthalmology | Admitting: Ophthalmology

## 2024-04-10 ENCOUNTER — Encounter
Admission: RE | Disposition: A | Discharge status: Home / Self Care | Payer: Self-pay | Source: Home / Self Care | Attending: Ophthalmology

## 2024-04-10 DIAGNOSIS — Z794 Long term (current) use of insulin: Secondary | ICD-10-CM | POA: Insufficient documentation

## 2024-04-10 DIAGNOSIS — J449 Chronic obstructive pulmonary disease, unspecified: Secondary | ICD-10-CM | POA: Diagnosis not present

## 2024-04-10 DIAGNOSIS — Z9842 Cataract extraction status, left eye: Secondary | ICD-10-CM | POA: Diagnosis not present

## 2024-04-10 DIAGNOSIS — Z961 Presence of intraocular lens: Secondary | ICD-10-CM | POA: Insufficient documentation

## 2024-04-10 DIAGNOSIS — Z7985 Long-term (current) use of injectable non-insulin antidiabetic drugs: Secondary | ICD-10-CM | POA: Diagnosis not present

## 2024-04-10 DIAGNOSIS — H2511 Age-related nuclear cataract, right eye: Secondary | ICD-10-CM | POA: Insufficient documentation

## 2024-04-10 DIAGNOSIS — E1136 Type 2 diabetes mellitus with diabetic cataract: Secondary | ICD-10-CM | POA: Diagnosis not present

## 2024-04-10 DIAGNOSIS — G4730 Sleep apnea, unspecified: Secondary | ICD-10-CM | POA: Insufficient documentation

## 2024-04-10 DIAGNOSIS — I10 Essential (primary) hypertension: Secondary | ICD-10-CM | POA: Diagnosis not present

## 2024-04-10 HISTORY — DX: Post-traumatic stress disorder, unspecified: F43.10

## 2024-04-10 HISTORY — DX: Personal history of unintended awareness under general anesthesia: Z92.84

## 2024-04-10 HISTORY — DX: Type 2 diabetes mellitus with other circulatory complications: E11.59

## 2024-04-10 HISTORY — PX: CATARACT EXTRACTION W/PHACO: SHX586

## 2024-04-10 LAB — GLUCOSE, CAPILLARY: Glucose-Capillary: 91 mg/dL (ref 70–99)

## 2024-04-10 SURGERY — PHACOEMULSIFICATION, CATARACT, WITH IOL INSERTION
Anesthesia: Monitor Anesthesia Care | Site: Eye | Laterality: Right

## 2024-04-10 MED ORDER — TETRACAINE HCL 0.5 % OP SOLN
OPHTHALMIC | Status: AC
  Filled 2024-04-10: qty 4

## 2024-04-10 MED ORDER — MIDAZOLAM HCL 2 MG/2ML IJ SOLN
INTRAMUSCULAR | Status: AC
  Filled 2024-04-10: qty 2

## 2024-04-10 MED ORDER — FENTANYL CITRATE (PF) 100 MCG/2ML IJ SOLN
INTRAMUSCULAR | Status: DC | PRN
  Administered 2024-04-10: 50 ug via INTRAVENOUS

## 2024-04-10 MED ORDER — TETRACAINE HCL 0.5 % OP SOLN
1.0000 [drp] | OPHTHALMIC | Status: DC | PRN
  Administered 2024-04-10 (×3): 1 [drp] via OPHTHALMIC

## 2024-04-10 MED ORDER — LACTATED RINGERS IV SOLN: INTRAVENOUS | Status: DC

## 2024-04-10 MED ORDER — MIDAZOLAM HCL 2 MG/2ML IJ SOLN
INTRAMUSCULAR | Status: DC | PRN
  Administered 2024-04-10 (×2): 1 mg via INTRAVENOUS

## 2024-04-10 MED ORDER — FENTANYL CITRATE (PF) 100 MCG/2ML IJ SOLN
INTRAMUSCULAR | Status: AC
  Filled 2024-04-10: qty 2

## 2024-04-10 MED ORDER — SIGHTPATH DOSE#1 NA HYALUR & NA CHOND-NA HYALUR IO KIT
PACK | INTRAOCULAR | Status: DC | PRN
Start: 2024-04-10 — End: 2024-04-10
  Administered 2024-04-10: 1 via OPHTHALMIC

## 2024-04-10 MED ORDER — MIDAZOLAM HCL 2 MG/2ML IJ SOLN
INTRAMUSCULAR | Status: AC
Start: 2024-04-10 — End: 2024-04-10
  Filled 2024-04-10: qty 2

## 2024-04-10 MED ORDER — ARMC OPHTHALMIC DILATING DROPS
1.0000 | OPHTHALMIC | Status: DC | PRN
  Administered 2024-04-10 (×3): 1 via OPHTHALMIC

## 2024-04-10 MED ORDER — SIGHTPATH DOSE#1 BSS IO SOLN
INTRAOCULAR | Status: DC | PRN
  Administered 2024-04-10: 100 mL via OPHTHALMIC

## 2024-04-10 MED ORDER — MIDAZOLAM HCL 2 MG/2ML IJ SOLN
2.0000 mg | Freq: Once | INTRAMUSCULAR | Status: AC
  Administered 2024-04-10: 2 mg via INTRAVENOUS

## 2024-04-10 MED ORDER — LIDOCAINE HCL (PF) 2 % IJ SOLN
INTRAMUSCULAR | Status: DC | PRN
  Administered 2024-04-10: 4 mL via INTRAOCULAR

## 2024-04-10 MED ORDER — MOXIFLOXACIN HCL 0.5 % OP SOLN
OPHTHALMIC | Status: DC | PRN
  Administered 2024-04-10: .2 mL via OPHTHALMIC

## 2024-04-10 MED ORDER — SIGHTPATH DOSE#1 BSS IO SOLN
INTRAOCULAR | Status: DC | PRN
  Administered 2024-04-10: 15 mL via INTRAOCULAR

## 2024-04-10 MED ORDER — ARMC OPHTHALMIC DILATING DROPS
OPHTHALMIC | Status: AC
Start: 2024-04-10 — End: 2024-04-10
  Filled 2024-04-10: qty 0.5

## 2024-04-10 SURGICAL SUPPLY — 9 items
DISSECTOR HYDRO NUCLEUS 50X22 (MISCELLANEOUS) ×1 IMPLANT
FEE CATARACT SUITE SIGHTPATH (MISCELLANEOUS) ×1 IMPLANT
GLOVE PI ULTRA LF STRL 7.5 (GLOVE) ×1 IMPLANT
GLOVE SURG SYN 6.5 PF PI BL (GLOVE) ×1 IMPLANT
GLOVE SURG SYN 8.5 PF PI BL (GLOVE) ×1 IMPLANT
LENS IOL TECNIS EYHANCE 29.0 (Intraocular Lens) IMPLANT
NDL FILTER BLUNT 18X1 1/2 (NEEDLE) ×1 IMPLANT
NEEDLE FILTER BLUNT 18X1 1/2 (NEEDLE) ×1 IMPLANT
SYR 3ML LL SCALE MARK (SYRINGE) ×1 IMPLANT

## 2024-04-10 NOTE — Op Note (Signed)
 OPERATIVE NOTE  Luke Coleman [id number] 04/10/2024   PREOPERATIVE DIAGNOSIS:  Nuclear sclerotic cataract right eye.  H25.11   POSTOPERATIVE DIAGNOSIS:    Nuclear sclerotic cataract right eye.     PROCEDURE:  Phacoemusification with posterior chamber intraocular lens placement of the right eye   LENS:   Implant Name Type Inv. Item Serial No. Manufacturer Lot No. LRB No. Used Action  LENS IOL TECNIS EYHANCE 29.0 - [id number] Intraocular Lens LENS IOL TECNIS EYHANCE 29.0 [id number] [employer]  Right 1 Implanted       Procedure(s): PHACOEMULSIFICATION, CATARACT, WITH IOL INSERTION 6.56 00:46.5 (Right)  SURGEON:  Adine Novak, MD, MPH  ANESTHESIOLOGIST: Anesthesiologist: Ola Donny BROCKS, MD CRNA: Jahoo, Sonia, CRNA   ANESTHESIA:  Topical with tetracaine  drops augmented with 1% preservative-free intracameral lidocaine .  ESTIMATED BLOOD LOSS: less than 1 mL.   COMPLICATIONS:  None.   DESCRIPTION OF PROCEDURE:  The patient was identified in the holding room and transported to the operating room and placed in the supine position under the operating microscope.  The right eye was identified as the operative eye and it was prepped and draped in the usual sterile ophthalmic fashion.   A 1.0 millimeter clear-corneal paracentesis was made at the 10:30 position. 0.5 ml of preservative-free 1% lidocaine  with epinephrine  was injected into the anterior chamber.  The anterior chamber was filled with viscoelastic.  A 2.4 millimeter keratome was used to make a near-clear corneal incision at the 8:00 position.  A curvilinear capsulorrhexis was made with a cystotome and capsulorrhexis forceps.  Balanced salt solution was used to hydrodissect and hydrodelineate the nucleus.   Phacoemulsification was then used in stop and chop fashion to remove the lens nucleus and epinucleus.  The remaining cortex was then removed using the irrigation and aspiration handpiece. Viscoelastic was then placed into the  capsular bag to distend it for lens placement.  A lens was then injected into the capsular bag.  The remaining viscoelastic was aspirated.   Wounds were hydrated with balanced salt solution.  The anterior chamber was inflated to a physiologic pressure with balanced salt solution.   Intracameral vigamox  0.1 mL undiluted was injected into the eye and a drop placed onto the ocular surface.  No wound leaks were noted.  The patient was taken to the recovery room in stable condition without complications of anesthesia or surgery  Adine Novak 04/10/2024, 11:12 AM

## 2024-04-10 NOTE — Transfer of Care (Signed)
 Immediate Anesthesia Transfer of Care Note  Patient: Luke Coleman  Procedure(s) Performed: PHACOEMULSIFICATION, CATARACT, WITH IOL INSERTION 6.56 00:46.5 (Right: Eye)  Patient Location: PACU  Anesthesia Type: MAC  Level of Consciousness: awake, alert  and patient cooperative  Airway and Oxygen Therapy: Patient Spontanous Breathing and Patient connected to supplemental oxygen  Post-op Assessment: Post-op Vital signs reviewed, Patient's Cardiovascular Status Stable, Respiratory Function Stable, Patent Airway and No signs of Nausea or vomiting  Post-op Vital Signs: Reviewed and stable  Complications: No notable events documented.

## 2024-04-10 NOTE — Discharge Instructions (Signed)

## 2024-04-10 NOTE — H&P (Signed)
 [facility]   Primary Care Physician:  Antonette Angeline ORN, NP Ophthalmologist: Dr. Adine Novak  Pre-Procedure History & Physical: HPI:  Luke Coleman is a 73 y.o. male here for cataract surgery.   Past Medical History:  Diagnosis Date   Acute bacterial sinusitis 08/22/2014   COPD (chronic obstructive pulmonary disease) (HCC)    Diabetes mellitus without complication (HCC)    Enlarged prostate    Essential (primary) hypertension 03/01/2013   Overview:  Last Assessment & Plan:  Taking meds as prescribed. Check bmet. Continue to follow.    History of unintended awareness under general anesthesia    HTN (hypertension) 03/01/2013   Hypertension    Hypertension associated with diabetes (HCC)    PTSD (post-traumatic stress disorder)    had abdominal surgery and awoke during surgery, so very anxious about that   Sleep apnea    on CPAP    Past Surgical History:  Procedure Laterality Date   APPENDECTOMY     BIOPSY Left 03/25/2020   Procedure: Bladder biopsy;  Surgeon: Penne Knee, MD;  Location: [facility] ORS;  Service: Urology;  Laterality: Left;   BOTOX  INJECTION N/A 03/25/2020   Procedure: BOTOX  INJECTION;  Surgeon: Penne Knee, MD;  Location: [facility] ORS;  Service: Urology;  Laterality: N/A;   CATARACT EXTRACTION W/PHACO Left 10/26/2018   Procedure: CATARACT EXTRACTION PHACO AND INTRAOCULAR LENS PLACEMENT (IOC) LEFT;  Surgeon: Novak Adine Anes, MD;  Location: [facility] ORS;  Service: Ophthalmology;  Laterality: Left;  US  00:30.7 CDE 20.7 Fluid pack Lot # [id number]   CIRCUMCISION  Age 84   COLON SURGERY     COLONOSCOPY WITH PROPOFOL  N/A 05/16/2020   Procedure: COLONOSCOPY WITH PROPOFOL ;  Surgeon: Janalyn Keene NOVAK, MD;  Location: [facility] ENDOSCOPY;  Service: Endoscopy;  Laterality: N/A;   EYE SURGERY Right 1971   correct lazy eye   HERNIA REPAIR     HOLEP-LASER ENUCLEATION OF THE PROSTATE WITH MORCELLATION N/A 05/29/2019   Procedure: HOLEP-LASER ENUCLEATION OF THE PROSTATE WITH  MORCELLATION;  Surgeon: Penne Knee, MD;  Location: [facility] ORS;  Service: Urology;  Laterality: N/A;   NASAL SEPTUM SURGERY     For deviated septum   SMALL INTESTINE SURGERY     Adhesions    Prior to Admission medications   Medication Sig Start Date End Date Taking? Authorizing Provider  albuterol  (PROVENTIL ) (2.5 MG/3ML) 0.083% nebulizer solution Take 3 mLs (2.5 mg total) by nebulization every 4 (four) hours as needed for wheezing. 12/07/23  Yes Karamalegos, Marsa PARAS, DO  amLODipine  (NORVASC ) 10 MG tablet Take 1 tablet (10 mg total) by mouth daily. 04/24/21  Yes Antonette Angeline ORN, NP  cetirizine (ZYRTEC) 10 MG tablet Take 10 mg by mouth daily.   Yes [provider]  Continuous Glucose Sensor (DEXCOM G7 SENSOR) MISC USE AS DIRECTED AND REPLACE SENSOR EVERY 10 DAYS 03/21/24  Yes Antonette Angeline ORN, NP  FIBER PO Take by mouth.   Yes [provider]  LANTUS  SOLOSTAR 100 UNIT/ML Solostar Pen SMARTSIG:50 Unit(s) SUB-Q Daily Patient taking differently: Inject 34 Units into the skin.   Yes [provider]  lisinopril  (ZESTRIL ) 40 MG tablet TAKE 1 TABLET(40 MG) BY MOUTH DAILY 01/08/21  Yes Karamalegos, Marsa PARAS, DO  MOUNJARO 10 MG/0.5ML Pen Inject 2 mLs into the skin once a week. Patient taking differently: Inject 15 mg into the skin once a week.   Yes [provider]  Multiple Vitamin (MULTIVITAMIN WITH MINERALS) TABS tablet Take 1 tablet by mouth daily.  Yes [provider]  ondansetron  (ZOFRAN -ODT) 4 MG disintegrating tablet Take 1 tablet (4 mg total) by mouth every 8 (eight) hours as needed for nausea or vomiting. 04/06/24  Yes Antonette Angeline ORN, NP    Allergies as of 03/30/2024 - Review Complete 03/30/2024  Allergen Reaction Noted   Adhesive [tape]  03/20/2020   Latex  03/20/2020   Silicone Other (See Comments) 03/20/2020   Sulfa  antibiotics Rash 07/27/2018    Family History  Problem Relation Age of Onset   Early death Mother        MVA - died  age 51   Alcohol abuse Father    Hypertension Father    Cancer Father 95       renal cell carcinoma   Heart disease Father 67       MI - died   Diabetes Brother    Sleep apnea Brother    Sleep apnea Daughter    Sleep apnea Brother    Diabetes Maternal Uncle    Diabetes Paternal Aunt    Kidney disease Maternal Grandmother     Social History   Socioeconomic History   Marital status: Married    Spouse name: [name]   Number of children: 1   Years of education: 18   Highest education level: Master's degree (e.g., MA, MS, MEng, MEd, MSW, MBA)  Occupational History   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 Supinski grew up in [place], [place], [place]. He currently is living in [place], [place] with his wife of 2 months. He was married previously to his second wife for 29 years. Previous to that, he was married to his first wife for 7 years and has 1 daughter from this marriage (38 y/o). He widowed in 2013 (second wife). 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 He obtained a Scientist, water quality in Programme researcher, broadcasting/film/video, Two Geophysical data processor from [employer] 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 He enjoys baseball. Enjoys the outdoors. He also enjoys watching old movies. He used to be a Psychologist, occupational for a Health and safety inspector.   Social Drivers of Health   Financial Resource Strain: Low Risk  (11/05/2017)   Overall Financial Resource Strain (CARDIA)    Difficulty of Paying Living Expenses:  Not hard at all  Food Insecurity: No Food Insecurity (11/05/2017)   Hunger Vital Sign    Worried About Running Out of Food in the Last Year: Never true    Ran Out of Food in the Last Year: Never true  Transportation Needs: No Transportation Needs (11/05/2017)   PRAPARE - Administrator, Civil Service (Medical): No    Lack of Transportation (Non-Medical): No  Physical Activity: Unknown (11/05/2017)   Exercise Vital Sign    Days of Exercise per Week: 0 days    Minutes of Exercise per Session: Not on file  Stress: Not on file  Social Connections: Not on file  Intimate Partner Violence: Not At Risk (11/05/2017)   Humiliation, Afraid, Rape, and Kick questionnaire    Fear of Current or Ex-Partner: No    Emotionally Abused: No    Physically Abused: No    Sexually Abused: No    Review of Systems: See HPI, otherwise negative ROS  Physical Exam: BP (!) 155/80   Pulse 88   Temp 97.8 F (36.6 C) (Temporal)   Resp 10   Ht 5' 7.01 (1.702 m)   Wt 86.6 kg   SpO2 98%   BMI 29.91 kg/m  General:   Alert, cooperative. Head:  Normocephalic and atraumatic. Respiratory:  Normal work of breathing. Cardiovascular:  NAD  Impression/Plan: Williamson Cavanah is here for cataract surgery.  Risks, benefits, limitations, and alternatives regarding cataract surgery have been reviewed with the patient.  Questions have been answered.  All parties agreeable.   Adine Novak, MD  04/10/2024, 10:47 AM

## 2024-04-10 NOTE — Anesthesia Postprocedure Evaluation (Signed)
 Anesthesia Post Note  Patient: Luke Coleman  Procedure(s) Performed: PHACOEMULSIFICATION, CATARACT, WITH IOL INSERTION 6.56 00:46.5 (Right: Eye)  Patient location during evaluation: PACU Anesthesia Type: MAC Level of consciousness: awake and alert Pain management: pain level controlled Vital Signs Assessment: post-procedure vital signs reviewed and stable Respiratory status: spontaneous breathing, nonlabored ventilation, respiratory function stable and patient connected to nasal cannula oxygen Cardiovascular status: stable and blood pressure returned to baseline Postop Assessment: no apparent nausea or vomiting Anesthetic complications: no   No notable events documented.   Last Vitals:  Vitals:   04/10/24 1114 04/10/24 1120  BP: 132/75 129/81  Pulse:  86  Resp: 12 17  Temp: 36.6 C 36.6 C  SpO2:  97%    Last Pain:  Vitals:   04/10/24 1120  TempSrc:   PainSc: 0-No pain                 [name] [name]

## 2024-04-11 ENCOUNTER — Encounter: Payer: Self-pay | Admitting: Ophthalmology

## 2024-04-13 ENCOUNTER — Encounter: Payer: Self-pay | Admitting: Internal Medicine

## 2024-04-20 ENCOUNTER — Ambulatory Visit (INDEPENDENT_AMBULATORY_CARE_PROVIDER_SITE_OTHER): Admitting: Internal Medicine

## 2024-04-20 ENCOUNTER — Encounter: Payer: Self-pay | Admitting: Internal Medicine

## 2024-04-20 VITALS — Ht 67.0 in | Wt 195.2 lb

## 2024-04-20 DIAGNOSIS — Z7985 Long-term (current) use of injectable non-insulin antidiabetic drugs: Secondary | ICD-10-CM

## 2024-04-20 DIAGNOSIS — J310 Chronic rhinitis: Secondary | ICD-10-CM

## 2024-04-20 DIAGNOSIS — E1159 Type 2 diabetes mellitus with other circulatory complications: Secondary | ICD-10-CM | POA: Diagnosis not present

## 2024-04-20 DIAGNOSIS — I152 Hypertension secondary to endocrine disorders: Secondary | ICD-10-CM

## 2024-04-20 DIAGNOSIS — H8113 Benign paroxysmal vertigo, bilateral: Secondary | ICD-10-CM | POA: Diagnosis not present

## 2024-04-20 MED ORDER — MECLIZINE HCL 25 MG PO TABS: 25.0000 mg | ORAL_TABLET | Freq: Three times a day (TID) | ORAL | 0 refills | Status: AC | PRN

## 2024-04-20 MED ORDER — LISINOPRIL 40 MG PO TABS: 20.0000 mg | ORAL_TABLET | Freq: Every day | ORAL | 0 refills | Status: AC

## 2024-04-20 NOTE — Progress Notes (Signed)
 Subjective:    Patient ID: Luke Coleman, male    DOB: 1951-08-20, 73 y.o.   MRN: [id number]  HPI  Discussed the use of AI scribe software for clinical note transcription with the patient, who gave verbal consent to proceed.  Luke Coleman is a 73 year old male who presents with dizziness and nausea, particularly when changing positions.  He has been experiencing dizziness and nausea, especially when lying down or getting up, for about a month. The symptoms worsened after cataract surgery last Monday but were present before. He describes the dizziness as a sensation of his head spinning and feeling imbalanced. Although the symptoms have improved since last week, they persist when changing positions.  He stopped taking zyrtec and lisinopril  four days ago, suspecting they might contribute to his symptoms, and noticed some improvement in dizziness since discontinuing these medications. His blood pressure today is 144/78, and he was previously on lisinopril  40 mg. He originally started taking zyrtec due to gustatory rhinitis, which caused his nose to run when eating, but feels zyrtec dries him out too much.  No dizziness while driving or changing lanes, and no ear pain, popping, or nasal congestion. No chest pain, chest tightness, shortness of breath, or feeling like he might pass out. He has not taken any over-the-counter medications for dizziness.    Review of Systems  Past Medical History:  Diagnosis Date   Acute bacterial sinusitis 08/22/2014   COPD (chronic obstructive pulmonary disease) (HCC)    Diabetes mellitus without complication (HCC)    Enlarged prostate    Essential (primary) hypertension 03/01/2013   Overview:  Last Assessment & Plan:  Taking meds as prescribed. Check bmet. Continue to follow.    History of unintended awareness under general anesthesia    HTN (hypertension) 03/01/2013   Hypertension    Hypertension associated with diabetes (HCC)    PTSD (post-traumatic  stress disorder)    had abdominal surgery and awoke during surgery, so very anxious about that   Sleep apnea    on CPAP    Current Outpatient Medications  Medication Sig Dispense Refill   albuterol  (PROVENTIL ) (2.5 MG/3ML) 0.083% nebulizer solution Take 3 mLs (2.5 mg total) by nebulization every 4 (four) hours as needed for wheezing. 75 mL 0   amLODipine  (NORVASC ) 10 MG tablet Take 1 tablet (10 mg total) by mouth daily. 90 tablet 1   cetirizine (ZYRTEC) 10 MG tablet Take 10 mg by mouth daily.     Continuous Glucose Sensor (DEXCOM G7 SENSOR) MISC USE AS DIRECTED AND REPLACE SENSOR EVERY 10 DAYS 12 each 0   FIBER PO Take by mouth.     LANTUS  SOLOSTAR 100 UNIT/ML Solostar Pen SMARTSIG:50 Unit(s) SUB-Q Daily (Patient taking differently: Inject 34 Units into the skin.)     lisinopril  (ZESTRIL ) 40 MG tablet TAKE 1 TABLET(40 MG) BY MOUTH DAILY 90 tablet 0   MOUNJARO 10 MG/0.5ML Pen Inject 2 mLs into the skin once a week. (Patient taking differently: Inject 15 mg into the skin once a week.)     Multiple Vitamin (MULTIVITAMIN WITH MINERALS) TABS tablet Take 1 tablet by mouth daily.     ondansetron  (ZOFRAN -ODT) 4 MG disintegrating tablet Take 1 tablet (4 mg total) by mouth every 8 (eight) hours as needed for nausea or vomiting. 30 tablet 0   No current facility-administered medications for this visit.    Allergies  Allergen Reactions   Adhesive [Tape]    Latex    Silicone Other (See Comments)  Pt states that it rips his skin.   Sulfa  Antibiotics Rash    Penile rash    Family History  Problem Relation Age of Onset   Early death Mother        MVA - died age 50   Alcohol abuse Father    Hypertension Father    Cancer Father 68       renal cell carcinoma   Heart disease Father 15       MI - died   Diabetes Brother    Sleep apnea Brother    Sleep apnea Daughter    Sleep apnea Brother    Diabetes Maternal Uncle    Diabetes Paternal Aunt    Kidney disease Maternal Grandmother      Social History   Socioeconomic History   Marital status: Married    Spouse name: [name]   Number of children: 1   Years of education: 18   Highest education level: Master's degree (e.g., MA, MS, MEng, MEd, MSW, MBA)  Occupational History   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 grew up in [place], [place], [place]. He currently is living in [place], [place] with his wife of 2 months. He was married previously to his second wife for 29 years. Previous to that, he was married to his first wife for 7 years and has 1 daughter from this marriage (69 y/o). He widowed in 2013 (second wife). 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 He obtained a Scientist, water quality in Programme researcher, broadcasting/film/video, Two Geophysical data processor from [employer] 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 He enjoys baseball. Enjoys the outdoors. He also enjoys watching old movies. He used to be a Psychologist, occupational for a Health and safety inspector.   Social Drivers of Health   Financial Resource Strain: Low Risk  (04/19/2024)   Overall Financial Resource Strain (CARDIA)    Difficulty of Paying Living Expenses: Not very hard  Food Insecurity: No Food Insecurity (04/19/2024)   Hunger Vital Sign    Worried About Running Out of Food in the Last Year: Never true    Ran Out of Food in the Last Year: Never true  Transportation Needs: No Transportation Needs (04/19/2024)   PRAPARE - Administrator, Civil Service (Medical): No     Lack of Transportation (Non-Medical): No  Physical Activity: Insufficiently Active (04/19/2024)   Exercise Vital Sign    Days of Exercise per Week: 1 day    Minutes of Exercise per Session: 20 min  Stress: No Stress Concern Present (04/19/2024)   [employer] of Occupational Health - Occupational Stress Questionnaire    Feeling of Stress: Only a little  Social Connections: Unknown (04/19/2024)   Social Connection and Isolation Panel    Frequency of Communication with Friends and Family: Once a week    Frequency of Social Gatherings with Friends and Family: Not on file    Attends Religious  Services: More than 4 times per year    Active Member of Clubs or Organizations: Yes    Attends Banker Meetings: More than 4 times per year    Marital Status: Patient declined  Intimate Partner Violence: Not At Risk (11/05/2017)   Humiliation, Afraid, Rape, and Kick questionnaire    Fear of Current or Ex-Partner: No    Emotionally Abused: No    Physically Abused: No    Sexually Abused: No     Constitutional: Denies fever, malaise, fatigue, headache or abrupt weight changes.  HEENT: Pt reports runny nose, ringing in the ears. Denies eye pain, eye redness, ear pain, wax buildup, nasal congestion, bloody nose, or sore throat. Respiratory: Denies difficulty breathing, shortness of breath, cough or sputum production.   Cardiovascular: Denies chest pain, chest tightness, palpitations or swelling in the hands or feet.  Gastrointestinal: Pt reports intermittent nausea, diarrhea. Denies abdominal pain, bloating, constipation, diarrhea or blood in the stool.  Musculoskeletal:  Denies decrease in range of motion, difficulty with gait, muscle pain or joint pain or swelling.  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 Denies redness, rashes, lesions or ulcercations.  Neurological: Pt reports dizziness. Denies difficulty with memory, difficulty with speech or problems with balance and coordination.     No other specific complaints in a complete review of systems (except as listed in HPI above).     Objective:   Physical Exam  Ht 5' 7 (1.702 m)   Wt 195 lb 3.2 oz (88.5 kg)   SpO2 97%   BMI 30.57 kg/m     Wt Readings from Last 3 Encounters:  04/10/24 191 lb (86.6 kg)  04/06/24 197 lb 6.4 oz (89.5 kg)  12/07/23 204 lb 8 oz (92.8 kg)    General: Appears his stated age, obese in NAD. HEENT: Head: normal shape and size; Eyes: sclera white, no icterus, conjunctiva pink, PERRLA and EOMs intact; Ears: Scarring noted of bilateral TMs which distorts reflex, no effusions noted.  Cardiovascular: Normal rate and rhythm. S1,S2 noted.  No murmur, rubs or gallops noted. No carotid bruits noted. Pulmonary/Chest: Normal effort and positive vesicular breath sounds. No respiratory distress. No wheezes, rales or ronchi noted.  Musculoskeletal: No difficulty with gait.  Neurological: Alert and oriented. Cranial nerves II-XII grossly intact. Coordination normal.    BMET    Component Value Date/Time   NA 142 01/14/2023 0849   K 4.5 01/14/2023 0849   CL 106 01/14/2023 0849   CO2 26 01/14/2023 0849   GLUCOSE 98 01/14/2023 0849   BUN 23 01/14/2023 0849   CREATININE 1.21 01/14/2023 0849   CALCIUM  9.2 01/14/2023 0849   GFRNONAA 42 (L) 03/21/2020 0917   GFRNONAA 57 (L) 06/02/2018 0915   GFRAA 48 (L) 03/21/2020 0917   GFRAA 66 06/02/2018 0915    Lipid Panel     Component Value Date/Time   CHOL 113 01/14/2023 0849   TRIG 70 01/14/2023 0849   HDL 39 (L) 01/14/2023 0849   CHOLHDL 2.9 01/14/2023 0849   LDLCALC 59 01/14/2023 0849    CBC    Component Value Date/Time   WBC 11.6 (H) 04/06/2024 1601   RBC 4.91 04/06/2024 1601   HGB 15.8 04/06/2024 1601   HCT 46.7 04/06/2024 1601   PLT 194 04/06/2024 1601   MCV 95.1 04/06/2024 1601   MCH 32.2 04/06/2024 1601   MCHC 33.8 04/06/2024 1601   RDW 12.9 04/06/2024 1601   LYMPHSABS 1,792 06/28/2018 1048   MONOABS 0.5 07/28/2016  0759    EOSABS 168 06/28/2018 1048   BASOSABS 28 06/28/2018 1048    Hgb A1C Lab Results  Component Value Date   HGBA1C 5.3 08/26/2023           Assessment & Plan:    Assessment and Plan    Benign paroxysmal positional vertigo Dizziness likely due to benign paroxysmal positional vertigo given its positional nature. Symptoms improved after stopping Zyrtec and lisinopril .  Orthostatics negative - Recommend Epley maneuver at home to reposition otoliths. - Prescribe meclizine  25 mg every 8 hours as needed for dizziness. - Refer to vestibular rehab if Epley maneuver is ineffective.  Hypertension Blood pressure elevated at 144/78 mmHg after discontinuing lisinopril . - Resume lisinopril  at 20 mg daily (half of the previous dose).  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 Zyrtec discontinued due to dryness. Discussed commonality with aging and potential need for Zyrtec if symptoms worsen. - Consider resuming Zyrtec if nasal drainage becomes bothersome and side effects are tolerable.        RTC in 6 months, follow-up chronic conditions Angeline Laura, NP

## 2024-04-20 NOTE — Patient Instructions (Signed)
 Benign Positional Vertigo Vertigo is the feeling that you or your surroundings are moving when they are not. Benign positional vertigo is the most common form of vertigo. This is usually a harmless condition (benign). This condition is positional. This means that symptoms are triggered by certain movements and positions. This condition can be dangerous if it occurs while you are doing something that could cause harm to yourself or others. This includes activities such as driving or operating machinery. What are the causes? The inner ear has fluid-filled canals that help your brain sense movement and balance. When the fluid moves, the brain receives messages about your body's position. With benign positional vertigo, calcium crystals in the inner ear break free and disturb the inner ear area. This causes your brain to receive confusing messages about your body's position. What increases the risk? You are more likely to develop this condition if: You are a woman. You are 64 years of age or older. You have recently had a head injury. You have an inner ear disease. What are the signs or symptoms? Symptoms of this condition usually happen when you move your head or your eyes in different directions. Symptoms may start suddenly and usually last for less than a minute. They include: Loss of balance and falling. Feeling like you are spinning or moving. Feeling like your surroundings are spinning or moving. Nausea and vomiting. Blurred vision. Dizziness. Involuntary eye movement (nystagmus). Symptoms can be mild and cause only minor problems, or they can be severe and interfere with daily life. Episodes of benign positional vertigo may return (recur) over time. Symptoms may also improve over time. How is this diagnosed? This condition may be diagnosed based on: Your medical history. A physical exam of the head, neck, and ears. Positional tests to check for or stimulate vertigo. You may be asked to  turn your head and change positions, such as going from sitting to lying down. A health care provider will watch for symptoms of vertigo. You may be referred to a health care provider who specializes in ear, nose, and throat problems (ENT or otolaryngologist) or a provider who specializes in disorders of the nervous system (neurologist). How is this treated?  This condition may be treated in a session in which your health care provider moves your head in specific positions to help the displaced crystals in your inner ear move. Treatment for this condition may take several sessions. Surgery may be needed in severe cases, but this is rare. In some cases, benign positional vertigo may resolve on its own in 2-4 weeks. Follow these instructions at home: Safety Move slowly. Avoid sudden body or head movements or certain positions, as told by your health care provider. Avoid driving or operating machinery until your health care provider says it is safe. Avoid doing any tasks that would be dangerous to you or others if vertigo occurs. If you have trouble walking or keeping your balance, try using a cane for stability. If you feel dizzy or unstable, sit down right away. Return to your normal activities as told by your health care provider. Ask your health care provider what activities are safe for you. General instructions Take over-the-counter and prescription medicines only as told by your health care provider. Drink enough fluid to keep your urine pale yellow. Keep all follow-up visits. This is important. Contact a health care provider if: You have a fever. Your condition gets worse or you develop new symptoms. Your family or friends notice any behavioral changes.  You have nausea or vomiting that gets worse. You have numbness or a prickling and tingling sensation. Get help right away if you: Have difficulty speaking or moving. Are always dizzy or faint. Develop severe headaches. Have weakness in  your legs or arms. Have changes in your hearing or vision. Develop a stiff neck. Develop sensitivity to light. These symptoms may represent a serious problem that is an emergency. Do not wait to see if the symptoms will go away. Get medical help right away. Call your local emergency services (911 in the [place] Do not drive yourself to the hospital. Summary Vertigo is the feeling that you or your surroundings are moving when they are not. Benign positional vertigo is the most common form of vertigo. This condition is caused by calcium crystals in the inner ear that become displaced. This causes a disturbance in an area of the inner ear that helps your brain sense movement and balance. Symptoms include loss of balance and falling, feeling that you or your surroundings are moving, nausea and vomiting, and blurred vision. This condition can be diagnosed based on symptoms, a physical exam, and positional tests. Follow safety instructions as told by your health care provider and keep all follow-up visits. This is important. This information is not intended to replace advice given to you by your health care provider. Make sure you discuss any questions you have with your health care provider. Document Revised: 03/01/2023 Document Reviewed: 03/01/2023 Elsevier Patient Education  2024 [employer].

## 2024-04-23 ENCOUNTER — Encounter: Payer: Self-pay | Admitting: Internal Medicine

## 2024-04-25 ENCOUNTER — Telehealth (INDEPENDENT_AMBULATORY_CARE_PROVIDER_SITE_OTHER): Admitting: Internal Medicine

## 2024-04-25 NOTE — Progress Notes (Signed)
 Pt did not want this appt

## 2024-09-07 ENCOUNTER — Telehealth: Payer: Self-pay

## 2024-09-07 ENCOUNTER — Other Ambulatory Visit (HOSPITAL_COMMUNITY): Payer: Self-pay

## 2024-09-07 NOTE — Telephone Encounter (Signed)
 Pharmacy Patient Advocate Encounter   Received notification from [facility] that prior authorization for Dexcom G7 sensors is required/requested.   Insurance verification completed.   The patient is insured through [employer].   Per test claim: PA required; PA submitted to above mentioned insurance via Latent Key/confirmation #/EOC [id number] Status is pending

## 2024-09-08 ENCOUNTER — Other Ambulatory Visit (HOSPITAL_COMMUNITY): Payer: Self-pay

## 2024-09-08 NOTE — Telephone Encounter (Signed)
 Pharmacy Patient Advocate Encounter  Received notification from [employer] that Prior Authorization for Dexcom G7 sensors has been APPROVED from 08/24/24 to 08/23/25   PA #/Case ID/Reference #: # [id number]  Unable to run test claim, still shows PA required.

## 2024-09-15 ENCOUNTER — Other Ambulatory Visit (HOSPITAL_COMMUNITY): Payer: Self-pay

## 2024-10-03 ENCOUNTER — Ambulatory Visit: Admitting: Internal Medicine
